# Patient Record
Sex: Female | Born: 1945 | Race: White | Hispanic: No | Marital: Married | Smoking: Current every day smoker
Health system: Southern US, Community
[De-identification: ages and names within clinical notes are randomized; demographics above are authoritative.]

## PROBLEM LIST (undated history)

## (undated) DIAGNOSIS — E7800 Pure hypercholesterolemia, unspecified: Secondary | ICD-10-CM

## (undated) DIAGNOSIS — Z923 Personal history of irradiation: Secondary | ICD-10-CM

## (undated) DIAGNOSIS — D649 Anemia, unspecified: Secondary | ICD-10-CM

## (undated) DIAGNOSIS — K5790 Diverticulosis of intestine, part unspecified, without perforation or abscess without bleeding: Secondary | ICD-10-CM

## (undated) DIAGNOSIS — N390 Urinary tract infection, site not specified: Secondary | ICD-10-CM

## (undated) DIAGNOSIS — Z8719 Personal history of other diseases of the digestive system: Secondary | ICD-10-CM

## (undated) DIAGNOSIS — R011 Cardiac murmur, unspecified: Secondary | ICD-10-CM

## (undated) DIAGNOSIS — C4490 Unspecified malignant neoplasm of skin, unspecified: Secondary | ICD-10-CM

## (undated) DIAGNOSIS — K219 Gastro-esophageal reflux disease without esophagitis: Secondary | ICD-10-CM

## (undated) DIAGNOSIS — I10 Essential (primary) hypertension: Secondary | ICD-10-CM

## (undated) DIAGNOSIS — Z85828 Personal history of other malignant neoplasm of skin: Secondary | ICD-10-CM

## (undated) DIAGNOSIS — Z9289 Personal history of other medical treatment: Secondary | ICD-10-CM

## (undated) DIAGNOSIS — E119 Type 2 diabetes mellitus without complications: Secondary | ICD-10-CM

## (undated) HISTORY — PX: OTHER SURGICAL HISTORY: SHX169

## (undated) HISTORY — PX: CHOLECYSTECTOMY: SHX55

## (undated) HISTORY — PX: BREAST LUMPECTOMY: SHX2

---

## 2001-01-06 ENCOUNTER — Other Ambulatory Visit: Admission: RE | Admit: 2001-01-06 | Discharge: 2001-01-06 | Payer: Self-pay | Admitting: Obstetrics and Gynecology

## 2001-04-28 ENCOUNTER — Ambulatory Visit (HOSPITAL_COMMUNITY): Admission: RE | Admit: 2001-04-28 | Discharge: 2001-04-28 | Payer: Self-pay | Admitting: Obstetrics and Gynecology

## 2001-04-28 ENCOUNTER — Encounter: Payer: Self-pay | Admitting: Obstetrics and Gynecology

## 2002-06-23 ENCOUNTER — Encounter: Payer: Self-pay | Admitting: Obstetrics and Gynecology

## 2002-06-23 ENCOUNTER — Ambulatory Visit (HOSPITAL_COMMUNITY): Admission: RE | Admit: 2002-06-23 | Discharge: 2002-06-23 | Payer: Self-pay | Admitting: Obstetrics and Gynecology

## 2002-07-21 ENCOUNTER — Other Ambulatory Visit: Admission: RE | Admit: 2002-07-21 | Discharge: 2002-07-21 | Payer: Self-pay | Admitting: Obstetrics and Gynecology

## 2002-12-13 ENCOUNTER — Encounter: Payer: Self-pay | Admitting: Internal Medicine

## 2002-12-13 ENCOUNTER — Ambulatory Visit (HOSPITAL_COMMUNITY): Admission: RE | Admit: 2002-12-13 | Discharge: 2002-12-13 | Payer: Self-pay | Admitting: Internal Medicine

## 2003-01-04 ENCOUNTER — Encounter (HOSPITAL_COMMUNITY): Admission: RE | Admit: 2003-01-04 | Discharge: 2003-02-03 | Payer: Self-pay | Admitting: Family Medicine

## 2003-07-20 ENCOUNTER — Ambulatory Visit (HOSPITAL_COMMUNITY): Admission: RE | Admit: 2003-07-20 | Discharge: 2003-07-20 | Payer: Self-pay | Admitting: Obstetrics and Gynecology

## 2004-08-07 ENCOUNTER — Ambulatory Visit (HOSPITAL_COMMUNITY): Admission: RE | Admit: 2004-08-07 | Discharge: 2004-08-07 | Payer: Self-pay | Admitting: Obstetrics and Gynecology

## 2005-09-10 ENCOUNTER — Ambulatory Visit (HOSPITAL_COMMUNITY): Admission: RE | Admit: 2005-09-10 | Discharge: 2005-09-10 | Payer: Self-pay | Admitting: Obstetrics and Gynecology

## 2006-12-16 ENCOUNTER — Ambulatory Visit (HOSPITAL_COMMUNITY): Admission: RE | Admit: 2006-12-16 | Discharge: 2006-12-16 | Payer: Self-pay | Admitting: Obstetrics and Gynecology

## 2007-09-03 HISTORY — PX: COLONOSCOPY: SHX174

## 2008-01-19 ENCOUNTER — Ambulatory Visit (HOSPITAL_COMMUNITY): Admission: RE | Admit: 2008-01-19 | Discharge: 2008-01-19 | Payer: Self-pay | Admitting: Obstetrics and Gynecology

## 2008-02-09 ENCOUNTER — Other Ambulatory Visit: Admission: RE | Admit: 2008-02-09 | Discharge: 2008-02-09 | Payer: Self-pay | Admitting: Internal Medicine

## 2008-03-01 ENCOUNTER — Ambulatory Visit (HOSPITAL_COMMUNITY): Admission: RE | Admit: 2008-03-01 | Discharge: 2008-03-01 | Payer: Self-pay | Admitting: General Surgery

## 2008-03-08 ENCOUNTER — Encounter: Admission: RE | Admit: 2008-03-08 | Discharge: 2008-03-08 | Payer: Self-pay | Admitting: Obstetrics and Gynecology

## 2009-02-22 ENCOUNTER — Ambulatory Visit (HOSPITAL_COMMUNITY): Admission: RE | Admit: 2009-02-22 | Discharge: 2009-02-22 | Payer: Self-pay | Admitting: Obstetrics and Gynecology

## 2009-02-28 ENCOUNTER — Other Ambulatory Visit: Admission: RE | Admit: 2009-02-28 | Discharge: 2009-02-28 | Payer: Self-pay | Admitting: Obstetrics and Gynecology

## 2010-03-13 ENCOUNTER — Ambulatory Visit (HOSPITAL_COMMUNITY): Admission: RE | Admit: 2010-03-13 | Discharge: 2010-03-13 | Payer: Self-pay | Admitting: Obstetrics and Gynecology

## 2010-04-03 ENCOUNTER — Other Ambulatory Visit: Admission: RE | Admit: 2010-04-03 | Discharge: 2010-04-03 | Payer: Self-pay | Admitting: Obstetrics and Gynecology

## 2010-09-23 ENCOUNTER — Encounter: Payer: Self-pay | Admitting: Obstetrics and Gynecology

## 2010-09-24 ENCOUNTER — Encounter: Payer: Self-pay | Admitting: Obstetrics and Gynecology

## 2011-01-15 NOTE — H&P (Signed)
NAME:  Courtney Dorsey, Courtney Dorsey               ACCOUNT NO.:  [id number]   MEDICAL RECORD NO.:  [id number]          PATIENT TYPE:  AMB   LOCATION:  DAY                           FACILITY:  [facility]   PHYSICIAN:  Dalia Heading, M.D.  DATE OF BIRTH:  November 11, 1945   DATE OF ADMISSION:  DATE OF DISCHARGE:  LH                              HISTORY & PHYSICAL   CHIEF COMPLAINT:  Family history of colon carcinoma.   HISTORY OF PRESENT ILLNESS:  The patient is a 65 year old white female  who is referred for endoscopic evaluation.  She needs a colonoscopy due  to family history of colon carcinoma in both her mother and brother.  No  abdominal pain, weight loss, nausea, vomiting, diarrhea, constipation,  melena, or hematochezia have been noted.  Never has had a colonoscopy.   PAST MEDICAL HISTORY:  Hypertension.   PAST SURGICAL HISTORY:  Cholecystectomy.   CURRENT MEDICATIONS:  1. Lipitor.  2. Prevacid.  3. Enalapril.  4. A fluid pill.   ALLERGIES:  No know drug allergies.   REVIEW OF SYSTEMS:  The patient smokes a pack of cigarettes a day.  She  denies alcohol use.  She denies any other cardiopulmonary difficulties  or bleeding disorders.   PHYSICAL EXAMINATION:  GENERAL:  The patient is a well-developed, well-  nourished white female in no acute distress.  LUNGS:  Clear to auscultation with equal breath sounds bilaterally.  HEART:  Regular rate and rhythm without S3, S4, or murmurs.  ABDOMEN:  Soft, nontender, and nondistended.  No hepatosplenomegaly or  masses are noted.  RECTAL:  Deferred to the procedure.   IMPRESSION:  Family history of colon carcinoma.   PLAN:  The patient is scheduled for a colonoscopy on March 01, 2008.  The  risks and benefits of the procedure including bleeding and perforation  were fully explained to the patient who gave informed consent.      Dalia Heading, M.D.  Electronically Signed     MAJ/MEDQ  D:  02/25/2008  T:  02/26/2008  Job:  [id number]   cc:   Corrie Mckusick, M.D.  Fax: [phone]

## 2011-04-10 ENCOUNTER — Other Ambulatory Visit: Payer: Self-pay | Admitting: Obstetrics and Gynecology

## 2011-04-10 DIAGNOSIS — Z139 Encounter for screening, unspecified: Secondary | ICD-10-CM

## 2011-04-18 ENCOUNTER — Ambulatory Visit (HOSPITAL_COMMUNITY)
Admission: RE | Admit: 2011-04-18 | Discharge: 2011-04-18 | Disposition: A | Discharge status: Home / Self Care | Payer: 59 | Source: Ambulatory Visit | Attending: Obstetrics and Gynecology | Admitting: Obstetrics and Gynecology

## 2011-04-18 DIAGNOSIS — Z1231 Encounter for screening mammogram for malignant neoplasm of breast: Secondary | ICD-10-CM | POA: Insufficient documentation

## 2011-04-18 DIAGNOSIS — Z139 Encounter for screening, unspecified: Secondary | ICD-10-CM

## 2012-04-10 ENCOUNTER — Other Ambulatory Visit: Payer: Self-pay | Admitting: Obstetrics and Gynecology

## 2012-04-10 DIAGNOSIS — Z139 Encounter for screening, unspecified: Secondary | ICD-10-CM

## 2012-04-20 ENCOUNTER — Ambulatory Visit (HOSPITAL_COMMUNITY)
Admission: RE | Admit: 2012-04-20 | Discharge: 2012-04-20 | Disposition: A | Discharge status: Home / Self Care | Payer: 59 | Source: Ambulatory Visit | Attending: Obstetrics and Gynecology | Admitting: Obstetrics and Gynecology

## 2012-04-20 DIAGNOSIS — Z139 Encounter for screening, unspecified: Secondary | ICD-10-CM

## 2012-04-20 DIAGNOSIS — Z1231 Encounter for screening mammogram for malignant neoplasm of breast: Secondary | ICD-10-CM | POA: Insufficient documentation

## 2012-05-07 ENCOUNTER — Other Ambulatory Visit: Payer: Self-pay | Admitting: Obstetrics & Gynecology

## 2012-05-07 ENCOUNTER — Other Ambulatory Visit (HOSPITAL_COMMUNITY)
Admission: RE | Admit: 2012-05-07 | Discharge: 2012-05-07 | Disposition: A | Discharge status: Home / Self Care | Payer: 59 | Source: Ambulatory Visit | Attending: Obstetrics & Gynecology | Admitting: Obstetrics & Gynecology

## 2012-05-07 DIAGNOSIS — Z01419 Encounter for gynecological examination (general) (routine) without abnormal findings: Secondary | ICD-10-CM | POA: Insufficient documentation

## 2012-12-29 ENCOUNTER — Ambulatory Visit (HOSPITAL_COMMUNITY)
Admission: RE | Admit: 2012-12-29 | Discharge: 2012-12-29 | Disposition: A | Discharge status: Home / Self Care | Payer: Medicare Other | Source: Ambulatory Visit | Attending: Family Medicine | Admitting: Family Medicine

## 2012-12-29 ENCOUNTER — Other Ambulatory Visit (HOSPITAL_COMMUNITY): Payer: Self-pay | Admitting: Family Medicine

## 2012-12-29 DIAGNOSIS — Z6837 Body mass index (BMI) 37.0-37.9, adult: Secondary | ICD-10-CM | POA: Diagnosis not present

## 2012-12-29 DIAGNOSIS — I700 Atherosclerosis of aorta: Secondary | ICD-10-CM | POA: Insufficient documentation

## 2012-12-29 DIAGNOSIS — K449 Diaphragmatic hernia without obstruction or gangrene: Secondary | ICD-10-CM | POA: Insufficient documentation

## 2012-12-29 DIAGNOSIS — I10 Essential (primary) hypertension: Secondary | ICD-10-CM | POA: Diagnosis not present

## 2012-12-29 DIAGNOSIS — E785 Hyperlipidemia, unspecified: Secondary | ICD-10-CM | POA: Diagnosis not present

## 2012-12-29 DIAGNOSIS — R079 Chest pain, unspecified: Secondary | ICD-10-CM

## 2012-12-29 DIAGNOSIS — R0602 Shortness of breath: Secondary | ICD-10-CM | POA: Insufficient documentation

## 2012-12-29 DIAGNOSIS — Z719 Counseling, unspecified: Secondary | ICD-10-CM | POA: Diagnosis not present

## 2012-12-29 DIAGNOSIS — R011 Cardiac murmur, unspecified: Secondary | ICD-10-CM | POA: Diagnosis not present

## 2012-12-29 DIAGNOSIS — E119 Type 2 diabetes mellitus without complications: Secondary | ICD-10-CM | POA: Diagnosis not present

## 2012-12-29 DIAGNOSIS — I517 Cardiomegaly: Secondary | ICD-10-CM | POA: Insufficient documentation

## 2012-12-29 DIAGNOSIS — F17200 Nicotine dependence, unspecified, uncomplicated: Secondary | ICD-10-CM | POA: Insufficient documentation

## 2012-12-31 ENCOUNTER — Inpatient Hospital Stay (HOSPITAL_COMMUNITY)
Admission: EM | Admit: 2012-12-31 | Discharge: 2013-01-01 | DRG: 379 | Disposition: A | Discharge status: Home / Self Care | Payer: Medicare Other | Attending: Family Medicine | Admitting: Family Medicine

## 2012-12-31 ENCOUNTER — Encounter (HOSPITAL_COMMUNITY): Payer: Self-pay | Admitting: *Deleted

## 2012-12-31 DIAGNOSIS — D500 Iron deficiency anemia secondary to blood loss (chronic): Secondary | ICD-10-CM | POA: Diagnosis present

## 2012-12-31 DIAGNOSIS — I10 Essential (primary) hypertension: Secondary | ICD-10-CM | POA: Diagnosis present

## 2012-12-31 DIAGNOSIS — E876 Hypokalemia: Secondary | ICD-10-CM | POA: Diagnosis present

## 2012-12-31 DIAGNOSIS — Z800 Family history of malignant neoplasm of digestive organs: Secondary | ICD-10-CM

## 2012-12-31 DIAGNOSIS — E7800 Pure hypercholesterolemia, unspecified: Secondary | ICD-10-CM | POA: Diagnosis present

## 2012-12-31 DIAGNOSIS — R011 Cardiac murmur, unspecified: Secondary | ICD-10-CM | POA: Diagnosis present

## 2012-12-31 DIAGNOSIS — K449 Diaphragmatic hernia without obstruction or gangrene: Secondary | ICD-10-CM | POA: Diagnosis present

## 2012-12-31 DIAGNOSIS — R0989 Other specified symptoms and signs involving the circulatory and respiratory systems: Secondary | ICD-10-CM

## 2012-12-31 DIAGNOSIS — F17200 Nicotine dependence, unspecified, uncomplicated: Secondary | ICD-10-CM | POA: Diagnosis present

## 2012-12-31 DIAGNOSIS — K648 Other hemorrhoids: Secondary | ICD-10-CM | POA: Diagnosis not present

## 2012-12-31 DIAGNOSIS — K5730 Diverticulosis of large intestine without perforation or abscess without bleeding: Secondary | ICD-10-CM | POA: Diagnosis present

## 2012-12-31 DIAGNOSIS — D509 Iron deficiency anemia, unspecified: Secondary | ICD-10-CM

## 2012-12-31 DIAGNOSIS — K2960 Other gastritis without bleeding: Secondary | ICD-10-CM | POA: Diagnosis present

## 2012-12-31 DIAGNOSIS — K922 Gastrointestinal hemorrhage, unspecified: Secondary | ICD-10-CM | POA: Diagnosis not present

## 2012-12-31 DIAGNOSIS — Z79899 Other long term (current) drug therapy: Secondary | ICD-10-CM

## 2012-12-31 DIAGNOSIS — D649 Anemia, unspecified: Secondary | ICD-10-CM

## 2012-12-31 DIAGNOSIS — E119 Type 2 diabetes mellitus without complications: Secondary | ICD-10-CM

## 2012-12-31 DIAGNOSIS — K2980 Duodenitis without bleeding: Secondary | ICD-10-CM | POA: Diagnosis not present

## 2012-12-31 DIAGNOSIS — K219 Gastro-esophageal reflux disease without esophagitis: Secondary | ICD-10-CM | POA: Diagnosis present

## 2012-12-31 DIAGNOSIS — R0609 Other forms of dyspnea: Secondary | ICD-10-CM | POA: Diagnosis not present

## 2012-12-31 DIAGNOSIS — R5381 Other malaise: Secondary | ICD-10-CM | POA: Diagnosis not present

## 2012-12-31 HISTORY — DX: Essential (primary) hypertension: I10

## 2012-12-31 HISTORY — DX: Type 2 diabetes mellitus without complications: E11.9

## 2012-12-31 HISTORY — DX: Cardiac murmur, unspecified: R01.1

## 2012-12-31 HISTORY — DX: Anemia, unspecified: D64.9

## 2012-12-31 HISTORY — DX: Diverticulosis of intestine, part unspecified, without perforation or abscess without bleeding: K57.90

## 2012-12-31 HISTORY — DX: Pure hypercholesterolemia, unspecified: E78.00

## 2012-12-31 LAB — HEPATIC FUNCTION PANEL
ALT: 14 U/L (ref 0–35)
Bilirubin, Direct: <0.1 mg/dL (ref 0.0–0.3)
Total Bilirubin: 0.3 mg/dL (ref 0.3–1.2)
Total Protein: 7.4 g/dL (ref 6.0–8.3)

## 2012-12-31 LAB — BASIC METABOLIC PANEL
CO2: 26 mEq/L (ref 19–32)
Chloride: 97 mEq/L (ref 96–112)
GFR calc Af Amer: 82 mL/min — ABNORMAL LOW (ref >90)
Glucose, Bld: 191 mg/dL — ABNORMAL HIGH (ref 70–99)

## 2012-12-31 LAB — CBC WITH DIFFERENTIAL/PLATELET
HCT: 20.3 % — ABNORMAL LOW (ref 36.0–46.0)
Hemoglobin: 5.3 g/dL — CL (ref 12.0–15.0)
Lymphocytes Relative: 18 % (ref 12–46)
Lymphs Abs: 1.9 10*3/uL (ref 0.7–4.0)
Monocytes Absolute: 0.9 10*3/uL (ref 0.1–1.0)
Neutro Abs: 7.1 10*3/uL (ref 1.7–7.7)
Neutrophils Relative %: 69 % (ref 43–77)
RDW: 19.2 % — ABNORMAL HIGH (ref 11.5–15.5)

## 2012-12-31 LAB — RETICULOCYTES: RBC.: 3.34 MIL/uL — ABNORMAL LOW (ref 3.87–5.11)

## 2012-12-31 MED ORDER — PEG 3350-KCL-NA BICARB-NACL 420 G PO SOLR
4000.0000 mL | Freq: Once | ORAL | Status: AC
  Administered 2012-12-31: 4000 mL via ORAL
  Filled 2012-12-31: qty 4000

## 2012-12-31 MED ORDER — ACETAMINOPHEN 325 MG PO TABS: 650.0000 mg | ORAL_TABLET | Freq: Four times a day (QID) | ORAL | Status: DC | PRN

## 2012-12-31 MED ORDER — ACETAMINOPHEN 650 MG RE SUPP: 650.0000 mg | Freq: Four times a day (QID) | RECTAL | Status: DC | PRN

## 2012-12-31 MED ORDER — ENALAPRIL MALEATE 5 MG PO TABS
10.0000 mg | ORAL_TABLET | Freq: Two times a day (BID) | ORAL | Status: DC
  Administered 2013-01-01 (×2): 10 mg via ORAL
  Filled 2012-12-31 (×2): qty 2

## 2012-12-31 MED ORDER — ATORVASTATIN CALCIUM 40 MG PO TABS
40.0000 mg | ORAL_TABLET | Freq: Every day | ORAL | Status: DC
  Administered 2012-12-31: 40 mg via ORAL
  Filled 2012-12-31: qty 1

## 2012-12-31 MED ORDER — SODIUM CHLORIDE 0.9 % IJ SOLN
3.0000 mL | Freq: Two times a day (BID) | INTRAMUSCULAR | Status: DC
  Administered 2013-01-01 (×2): 3 mL via INTRAVENOUS

## 2012-12-31 MED ORDER — PANTOPRAZOLE SODIUM 40 MG IV SOLR
40.0000 mg | Freq: Two times a day (BID) | INTRAVENOUS | Status: DC
  Administered 2013-01-01 (×2): 40 mg via INTRAVENOUS
  Filled 2012-12-31 (×2): qty 40

## 2012-12-31 MED ORDER — PEG 3350-KCL-NA BICARB-NACL 420 G PO SOLR
ORAL | Status: AC
  Filled 2012-12-31: qty 4000

## 2012-12-31 MED ORDER — SODIUM CHLORIDE 0.9 % IJ SOLN
3.0000 mL | INTRAMUSCULAR | Status: DC | PRN
  Administered 2012-12-31: 3 mL via INTRAVENOUS

## 2012-12-31 MED ORDER — POTASSIUM CHLORIDE CRYS ER 20 MEQ PO TBCR
40.0000 meq | EXTENDED_RELEASE_TABLET | Freq: Once | ORAL | Status: AC
  Administered 2012-12-31: 40 meq via ORAL
  Filled 2012-12-31: qty 2

## 2012-12-31 MED ORDER — ONDANSETRON HCL 4 MG/2ML IJ SOLN: 4.0000 mg | Freq: Four times a day (QID) | INTRAMUSCULAR | Status: DC | PRN

## 2012-12-31 MED ORDER — SODIUM CHLORIDE 0.9 % IJ SOLN
3.0000 mL | Freq: Two times a day (BID) | INTRAMUSCULAR | Status: DC
  Administered 2013-01-01: 3 mL via INTRAVENOUS

## 2012-12-31 MED ORDER — ONDANSETRON HCL 4 MG PO TABS: 4.0000 mg | ORAL_TABLET | Freq: Four times a day (QID) | ORAL | Status: DC | PRN

## 2012-12-31 MED ORDER — INSULIN ASPART 100 UNIT/ML ~~LOC~~ SOLN
0.0000 [IU] | Freq: Three times a day (TID) | SUBCUTANEOUS | Status: DC
  Administered 2013-01-01: 1 [IU] via SUBCUTANEOUS

## 2012-12-31 MED ORDER — SODIUM CHLORIDE 0.9 % IV SOLN: 250.0000 mL | INTRAVENOUS | Status: DC | PRN

## 2012-12-31 NOTE — ED Provider Notes (Signed)
History     CSN: [id number]  Arrival date & time 12/31/12  1237   First MD Initiated Contact with Patient 12/31/12 1258      Chief Complaint  Patient presents with  . Anemia    (Consider location/radiation/quality/duration/timing/severity/associated sxs/prior treatment) HPI Patient states she started getting dyspnea on exertion about one to 2 months ago. She denies any chest pain, dizziness, or lightheadedness. She states when she feels short of breath she feels mild weakness. She has noticed her exercise tolerance is worse and she feels bad with doing activities such as vacuuming the house. She denies any rectal bleeding but states maybe a month ago she did have one episode of a black stool. She denies any abdominal pain, nausea, vomiting, menstrual bleeding. She was seen by her PCP 2 days ago and was called today that she was anemic and told to come to the ED. She states she's never been anemic before and she's never had to have a transfusion before. She states she did have a colonoscopy when she was 34 and states as far she recalls everything was normal, she does not recall being told she had diverticulosis or polyps.  PCP Dr. Phillips Odor   Past Medical History  Diagnosis Date  . Diabetes mellitus without complication   . Anemia   . Heart murmur   . Hypertension   . Hypercholesteremia     Past Surgical History  Procedure Laterality Date  . Cholecystectomy      History reviewed. No pertinent family history.  History  Substance Use Topics  . Smoking status: Current Every Day Smoker  . Smokeless tobacco: Not on file  . Alcohol Use: No  retired On week 2 of nicotene patches  OB History   Grav Para Term Preterm Abortions TAB SAB Ect Mult Living                  Review of Systems  All other systems reviewed and are negative.    Allergies  Review of patient's allergies indicates not on file.  Home Medications   Current Outpatient Rx  Name  Route  Sig  Dispense   Refill  . atorvastatin (LIPITOR) 40 MG tablet   Oral   Take 40 mg by mouth daily.         . ENALAPRIL MALEATE PO   Oral   Take 1 tablet by mouth 2 (two) times daily.         . hydrochlorothiazide (MICROZIDE) 12.5 MG capsule   Oral   Take 12.5 mg by mouth daily.         ASA 81 mg daily Migraine OTC 2-3 times a week for headaches  BP 149/62  Pulse 123  Temp(Src) 98.1 F (36.7 C) (Oral)  Resp 18  Ht 5\' 2"  (1.575 m)  Wt 190 lb (86.183 kg)  BMI 34.74 kg/m2  SpO2 98%  Vital signs normal except tachycardia   Physical Exam  Nursing note and vitals reviewed. Constitutional: She is oriented to person, place, and time. She appears well-developed and well-nourished.  Non-toxic appearance. She does not appear ill. No distress.  HENT:  Head: Normocephalic and atraumatic.  Right Ear: External ear normal.  Left Ear: External ear normal.  Nose: Nose normal. No mucosal edema or rhinorrhea.  Mouth/Throat: Oropharynx is clear and moist and mucous membranes are normal. No dental abscesses or edematous.  Eyes: Conjunctivae and EOM are normal. Pupils are equal, round, and reactive to light.  Neck: Normal range of motion  and full passive range of motion without pain. Neck supple.  Cardiovascular: Regular rhythm and normal heart sounds.  Tachycardia present.  Exam reveals no gallop and no friction rub.   No murmur heard. Pulmonary/Chest: Effort normal and breath sounds normal. No respiratory distress. She has no wheezes. She has no rhonchi. She has no rales. She exhibits no tenderness and no crepitus.  Abdominal: Soft. Normal appearance and bowel sounds are normal. She exhibits no distension. There is no tenderness. There is no rebound and no guarding.  Genitourinary:  No external hemorrhoids, no stool in vault, specks of brown stool on glove  Musculoskeletal: Normal range of motion. She exhibits no edema and no tenderness.  Moves all extremities well.   Neurological: She is alert and  oriented to person, place, and time. She has normal strength. No cranial nerve deficit.  Skin: Skin is warm, dry and intact. No rash noted. No erythema. There is pallor.  Psychiatric: She has a normal mood and affect. Her speech is normal and behavior is normal. Her mood appears not anxious.    ED Course  Procedures (including critical care time)     Reviewed results of her CBC, and prepared patient to get transfusion.  14:04 Dr Irene Limbo, admit to step-down, team 2    Results for orders placed during the hospital encounter of 12/31/12  CBC WITH DIFFERENTIAL      Result Value Range   WBC 10.3  4.0 - 10.5 K/uL   RBC 3.37 (*) 3.87 - 5.11 MIL/uL   Hemoglobin 5.3 (*) 12.0 - 15.0 g/dL   HCT 95.6 (*) 21.3 - 08.6 %   MCV 60.2 (*) 78.0 - 100.0 fL   MCH 15.7 (*) 26.0 - 34.0 pg   MCHC 26.1 (*) 30.0 - 36.0 g/dL   RDW 57.8 (*) 46.9 - 62.9 %   Platelets 387  150 - 400 K/uL   Neutrophils Relative 69  43 - 77 %   Lymphocytes Relative 18  12 - 46 %   Monocytes Relative 9  3 - 12 %   Eosinophils Relative 3  0 - 5 %   Basophils Relative 1  0 - 1 %   Neutro Abs 7.1  1.7 - 7.7 K/uL   Lymphs Abs 1.9  0.7 - 4.0 K/uL   Monocytes Absolute 0.9  0.1 - 1.0 K/uL   Eosinophils Absolute 0.3  0.0 - 0.7 K/uL   Basophils Absolute 0.1  0.0 - 0.1 K/uL   RBC Morphology POLYCHROMASIA PRESENT    BASIC METABOLIC PANEL      Result Value Range   Sodium 135  135 - 145 mEq/L   Potassium 3.4 (*) 3.5 - 5.1 mEq/L   Chloride 97  96 - 112 mEq/L   CO2 26  19 - 32 mEq/L   Glucose, Bld 191 (*) 70 - 99 mg/dL   BUN 17  6 - 23 mg/dL   Creatinine, Ser 5.28  0.50 - 1.10 mg/dL   Calcium 9.1  8.4 - 41.3 mg/dL   GFR calc non Af Amer 70 (*) >90 mL/min   GFR calc Af Amer 82 (*) >90 mL/min  OCCULT BLOOD, POC DEVICE      Result Value Range   Fecal Occult Bld NEGATIVE  NEGATIVE    Laboratory interpretation all normal except anemia, indices c/w iron deficiency  Dg Chest 2 View  12/29/2012  *RADIOLOGY REPORT*  Clinical  Data: Shortness of breath for 1 month.  Smoker.  CHEST - 2 VIEW  Comparison: None.  Findings: Lateral view degraded by patient arm position.  Right upper quadrant surgical clips.  Midline trachea.  Borderline cardiomegaly, with atherosclerosis in the transverse aorta. No pleural effusion or pneumothorax.  A moderate-to-large hiatal hernia. Clear lungs.  IMPRESSION:  1. No acute cardiopulmonary disease. 2.  Moderate-to-large hiatal hernia. 3.  Borderline cardiomegaly, with atherosclerosis in the transverse aorta.   Original Report Authenticated By: Jeronimo Greaves, M.D.     Date: 12/31/2012  Rate: 118  Rhythm: sinus tachycardia  QRS Axis: normal  Intervals: normal  ST/T Wave abnormalities: normal  Conduction Disutrbances:none  Narrative Interpretation:   Old EKG Reviewed: none available    1. Anemia   2. DOE (dyspnea on exertion)     Plan admission   CRITICAL CARE Performed by: Joram Venson L   Total critical care time: 31 min   Critical care time was exclusive of separately billable procedures and treating other patients.  Critical care was necessary to treat or prevent imminent or life-threatening deterioration.  Critical care was time spent personally by me on the following activities: development of treatment plan with patient and/or surrogate as well as nursing, discussions with consultants, evaluation of patient's response to treatment, examination of patient, obtaining history from patient or surrogate, ordering and performing treatments and interventions, ordering and review of laboratory studies, ordering and review of radiographic studies, pulse oximetry and re-evaluation of patient's condition.   MDM          Ward Givens, MD 12/31/12 [phone]

## 2012-12-31 NOTE — ED Notes (Signed)
Blood bank tube hemolyzed per lab. On way to restick.

## 2012-12-31 NOTE — ED Notes (Signed)
CRITICAL VALUE ALERT  Critical value received:  hgb 5.3  hct 20.3  Date of notification:  12/31/12  Time of notification:  1325  Critical value read back:yes  Nurse who received alert:  c Spiro Ausborn rn  MD notified (1st page):  Dr Devoria Albe  Time of first page:    MD notified (2nd page):  Time of second page:  Responding MD:  Dr Devoria Albe  Time MD responded:  [phone]

## 2012-12-31 NOTE — H&P (Signed)
History and Physical  Courtney Dorsey:[id number] DOB: [birth year] DOA: 12/31/2012  Referring physician: Devoria Albe, MD PCP: Colette Ribas, MD   Chief Complaint: short of breath  HPI:  66 year old woman presented to the emergency department for anemia. Hemoglobin was found to be 5.3 and patient was admitted for further evaluation.  History obtained from patient at bedside. For the last 2 months she has had progressive dyspnea on exertion. She has done fairly well with this but now becomes short of breath when vacuuming, requiring rest. She has had no chest pain or other symptoms. She has had no bleeding that she is aware of. She may apply had one dark stool sometime ago. She had colonoscopy approximately 5 years ago which she reports to have been unremarkable. She does take ibuprofen. No syncope. She saw her primary care physician 2 days ago for her increasing shortness of breath. Chest x-ray at that time was negative. Blood work was obtained with result of marked anemia. She was contacted today by her primary care physician and referred to the emergency department.  In the emergency department she was noted to be afebrile, mildly tachycardic, normotensive. No hypoxia. She was able to ambulate to the bathroom without difficulty.  Review of Systems:  Negative for fever, visual changes, sore throat, rash, new muscle aches, chest pain, dysuria, bleeding, n/v/abdominal pain.  Past Medical History  Diagnosis Date  . Diabetes mellitus without complication   . Anemia   . Heart murmur   . Hypertension   . Hypercholesteremia   . Diverticulosis     Past Surgical History  Procedure Laterality Date  . Cholecystectomy      Social History:  reports that she has been smoking.  She does not have any smokeless tobacco history on file. She reports that she does not drink alcohol or use illicit drugs.  No Known Allergies  Family History  Problem Relation Age of Onset  . Colon cancer Mother       Prior to Admission medications   Medication Sig Start Date End Date Taking? Authorizing Provider  atorvastatin (LIPITOR) 40 MG tablet Take 40 mg by mouth daily.   Yes Historical Provider, MD  ENALAPRIL MALEATE PO Take 10 mg by mouth 2 (two) times daily.    Yes Historical Provider, MD  hydrochlorothiazide (HYDRODIURIL) 25 MG tablet Take 25 mg by mouth daily before breakfast.   Yes Historical Provider, MD  ibuprofen (ADVIL,MOTRIN) 800 MG tablet Take 800 mg by mouth as needed for pain.   Yes Historical Provider, MD  metFORMIN (GLUCOPHAGE) 500 MG tablet Take 250 mg by mouth 2 (two) times daily with a meal.   Yes Historical Provider, MD  omeprazole (PRILOSEC) 40 MG capsule Take 40 mg by mouth daily.   Yes Historical Provider, MD   Physical Exam: Filed Vitals:   12/31/12 1245 12/31/12 1441  BP: 149/62 139/63  Pulse: 123 103  Temp: 98.1 F (36.7 C)   TempSrc: Oral   Resp: 18 21  Height: 5\' 2"  (1.575 m)   Weight: 86.183 kg (190 lb)   SpO2: 98% 96%    General:  Examined in the emergency department. Appears calm and comfortable Eyes: PERRL, normal lids, irises  ENT: grossly normal hearing, lips & tongue Neck: no LAD, masses or thyromegaly Cardiovascular: RRR, no m/r/g. No LE edema. Respiratory: CTA bilaterally, no w/r/r. Normal respiratory effort. Abdomen: soft, ntnd Skin: no rash or induration seen  Musculoskeletal: grossly normal tone BUE/BLE Psychiatric: grossly normal mood and affect,  speech fluent and appropriate Neurologic: grossly non-focal.  Wt Readings from Last 3 Encounters:  12/31/12 86.183 kg (190 lb)    Labs on Admission:  Basic Metabolic Panel:  Recent Labs Lab 12/31/12 1255  NA 135  K 3.4*  CL 97  CO2 26  GLUCOSE 191*  BUN 17  CREATININE 0.85  CALCIUM 9.1    Liver Function Tests:  Recent Labs Lab 12/31/12 1303  AST 16  ALT 14  ALKPHOS 94  BILITOT 0.3  PROT 7.4  ALBUMIN 3.9    CBC:  Recent Labs Lab 12/31/12 1255  WBC 10.3   NEUTROABS 7.1  HGB 5.3*  HCT 20.3*  MCV 60.2*  PLT 387    EKG: Independently reviewed. Sinus tachycardia. No acute changes.   Principal Problem:   Microcytic anemia Active Problems:   Diabetes mellitus type 2, controlled   GI bleed   Assessment/Plan 1. Marked microcytic anemia, suspect subacute GI bleed: History, stable hemodynamics and microcytosis suggest subacute process. Guaiac negative. Appears stable for admission to the medical floor. GI consultation. PPI. Transfuse 2 units packed red blood cells. Check anemia panel. May be secondary to ibuprofen. Associated with that seen on exertion. 2. Hypokalemia: Replete. 3. Diabetes mellitus type 2: Sliding scale insulin. Resume metformin on discharge. 4. Hypertension: stable.  5. Cigarette smoker: Nicotine patch. She is trying to quit.  Code Status: Full code Family Communication: Discussed with husband at bedside Disposition Plan/Anticipated LOS: Admit. 2-3 days.  Time spent: 60 minutes  Brendia Sacks, MD  [facility] Pager [phone] 12/31/2012, 2:55 PM

## 2012-12-31 NOTE — Consult Note (Signed)
Referring Provider: Standley Brooking, MD Primary Care Physician:  Colette Ribas, MD Primary Gastroenterologist:  Roetta Sessions, MD  Reason for Consultation:  Profound microcytic anemia  HPI: Courtney Dorsey is a 67 y.o. female admitted with profound anemia. Two month h/o progressive DOE. Had labs with PCP and called today with results. Advised to go to ED for profound anemia. In ED, her H/H were 5.3/20.3, MCV 60.2. Heme negative in the ED. Ibuprofen off/on for joint pain. No BC or Goody's powders. Excedrine migraine medication occasionally. No brbpr, melena. No constipation, diarrhea, abdominal pain. Heartburn not controlled on omeprazole. Sometimes also takes zantac in evening. No dysphagia. No weight loss. Appetite good. GERD more than five years. No prior EGD. Last colonoscopy 2009 by Dr. Franky Macho. Mother had colon cancer at age 21, brother at age 40.   Prior to Admission medications   Medication Sig Start Date End Date Taking? Authorizing Provider  atorvastatin (LIPITOR) 40 MG tablet Take 40 mg by mouth daily.   Yes Historical Provider, MD  ENALAPRIL MALEATE PO Take 10 mg by mouth 2 (two) times daily.    Yes Historical Provider, MD  hydrochlorothiazide (HYDRODIURIL) 25 MG tablet Take 25 mg by mouth daily before breakfast.   Yes Historical Provider, MD  ibuprofen (ADVIL,MOTRIN) 800 MG tablet Take 800 mg by mouth as needed for pain.   Yes Historical Provider, MD  metFORMIN (GLUCOPHAGE) 500 MG tablet Take 250 mg by mouth 2 (two) times daily with a meal.   Yes Historical Provider, MD  omeprazole (PRILOSEC) 40 MG capsule Take 40 mg by mouth daily.   Yes Historical Provider, MD    Current Facility-Administered Medications  Medication Dose Route Frequency Provider Last Rate Last Dose  . 0.9 %  sodium chloride infusion  250 mL Intravenous PRN Standley Brooking, MD      . acetaminophen (TYLENOL) tablet 650 mg  650 mg Oral Q6H PRN Standley Brooking, MD       Or  . acetaminophen (TYLENOL)  suppository 650 mg  650 mg Rectal Q6H PRN Standley Brooking, MD      . atorvastatin (LIPITOR) tablet 40 mg  40 mg Oral q1800 Standley Brooking, MD      . enalapril (VASOTEC) tablet 10 mg  10 mg Oral BID Standley Brooking, MD      . insulin aspart (novoLOG) injection 0-9 Units  0-9 Units Subcutaneous TID WC Standley Brooking, MD      . ondansetron [facility] tablet 4 mg  4 mg Oral Q6H PRN Standley Brooking, MD       Or  . ondansetron [facility]) injection 4 mg  4 mg Intravenous Q6H PRN Standley Brooking, MD      . pantoprazole (PROTONIX) injection 40 mg  40 mg Intravenous Q12H Standley Brooking, MD      . potassium chloride SA (K-DUR,KLOR-CON) CR tablet 40 mEq  40 mEq Oral Once Standley Brooking, MD      . sodium chloride 0.9 % injection 3 mL  3 mL Intravenous Q12H Standley Brooking, MD      . sodium chloride 0.9 % injection 3 mL  3 mL Intravenous Q12H Standley Brooking, MD      . sodium chloride 0.9 % injection 3 mL  3 mL Intravenous PRN Standley Brooking, MD        Allergies as of 12/31/2012  . (No Known Allergies)    Past Medical History  Diagnosis  Date  . Diabetes mellitus without complication   . Anemia   . Heart murmur   . Hypertension   . Hypercholesteremia   . Diverticulosis     Past Surgical History  Procedure Laterality Date  . Cholecystectomy    . Colonoscopy  2009    Dr. Franky Macho    Family History  Problem Relation Age of Onset  . Colon cancer Mother     diagnosed around age 53  . Colon cancer Brother     age 40  . Liver disease Neg Hx   . Lung cancer Neg Hx   . Breast cancer Neg Hx   . Ovarian cancer Neg Hx   . Celiac disease Neg Hx     History   Social History  . Marital Status: Married    Spouse Name: N/A    Number of Children: 1  . Years of Education: N/A   Occupational History  .     Social History Main Topics  . Smoking status: Current Every Day Smoker  . Smokeless tobacco: Never Used  . Alcohol Use: No  . Drug Use: No  . Sexually  Active: Yes    Birth Control/ Protection: Post-menopausal   Other Topics Concern  . Not on file   Social History Narrative  . No narrative on file     ROS:  General: Negative for anorexia, weight loss, fever, chill. c/o fatigue, weakness. Eyes: Negative for vision changes.  ENT: Negative for hoarseness, difficulty swallowing , nasal congestion. CV: Negative for chest pain, angina, palpitations,  peripheral edema. +DOE Respiratory: Negative for dyspnea at rest,  cough, sputum, wheezing. +DOE GI: See history of present illness. GU:  Negative for dysuria, hematuria, urinary incontinence, urinary frequency, nocturnal urination.  MS: Negative for joint pain, low back pain.  Derm: Negative for rash or itching.  Neuro: Negative for weakness, abnormal sensation, seizure, frequent headaches, memory loss, confusion.  Psych: Negative for anxiety, depression, suicidal ideation, hallucinations.  Endo: Negative for unusual weight change.  Heme: Negative for bruising or bleeding. Allergy: Negative for rash or hives.       Physical Examination: Vital signs in last 24 hours: Temp:  [98.1 F (36.7 C)-99.1 F (37.3 C)] 99.1 F (37.3 C) (05/01 1703) Pulse Rate:  [99-123] 99 (05/01 1703) Resp:  [18-21] 20 (05/01 1703) BP: (139-156)/(62-77) 153/69 mmHg (05/01 1703) SpO2:  [96 %-98 %] 97 % (05/01 1703) Weight:  [190 lb (86.183 kg)-198 lb 3.2 oz (89.903 kg)] 198 lb 3.2 oz (89.903 kg) (05/01 1510) Last BM Date: 12/30/12  General: Well-nourished, well-developed in no acute distress.  Head: Normocephalic, atraumatic.   Eyes: Conjunctiva pink, no icterus. Mouth: Oropharyngeal mucosa moist and pink , no lesions erythema or exudate. Neck: Supple without thyromegaly, masses, or lymphadenopathy.  Lungs: Clear to auscultation bilaterally.  Heart: Regular rate and rhythm, no murmurs rubs or gallops.  Abdomen: Bowel sounds are normal, nontender, nondistended, no hepatosplenomegaly or masses, no  abdominal bruits or    hernia , no rebound or guarding.   Rectal: not performed Extremities: No lower extremity edema, clubbing, deformity.  Neuro: Alert and oriented x 4 , grossly normal neurologically.  Skin: Warm and dry, no rash or jaundice.   Psych: Alert and cooperative, normal mood and affect.        Intake/Output from previous day:   Intake/Output this shift: Total I/O In: 512.5 [I.V.:500; Blood:12.5] Out: -   Lab Results: CBC  Recent Labs  12/31/12 1255  WBC 10.3  HGB 5.3*  HCT 20.3*  MCV 60.2*  PLT 387   BMET  Recent Labs  12/31/12 1255  NA 135  K 3.4*  CL 97  CO2 26  GLUCOSE 191*  BUN 17  CREATININE 0.85  CALCIUM 9.1   LFT  Recent Labs  12/31/12 1303  BILITOT 0.3  BILIDIR <0.1  IBILI NOT CALCULATED  ALKPHOS 94  AST 16  ALT 14  PROT 7.4  ALBUMIN 3.9     PT/INR  Recent Labs  12/31/12 1303  LABPROT 12.4  INR 0.93      Imaging Studies: Dg Chest 2 View  12/29/2012  *RADIOLOGY REPORT*  Clinical Data: Shortness of breath for 1 month.  Smoker.  CHEST - 2 VIEW  Comparison: None.  Findings: Lateral view degraded by patient arm position.  Right upper quadrant surgical clips.  Midline trachea.  Borderline cardiomegaly, with atherosclerosis in the transverse aorta. No pleural effusion or pneumothorax.  A moderate-to-large hiatal hernia. Clear lungs.  IMPRESSION:  1. No acute cardiopulmonary disease. 2.  Moderate-to-large hiatal hernia. 3.  Borderline cardiomegaly, with atherosclerosis in the transverse aorta.   Original Report Authenticated By: Jeronimo Greaves, M.D.   Pierre.Alas week]   Impression: 67 y/o female with profound microcytic anemia, hemoccult negative stool. Last colonoscopy five years ago and reportedly negative. C/O chronic GERD, uncontrolled on omeprazole. Occasional NSAID use. FH significant for colon cancer in mother and brother. She needs to be evaluated for colon cancer. If negative, consider EGD +/- small bowel biopsy for  celiac.  Plan: 1. Transfuse prbcs today. 2. Clear liquid diet. NPO after midnight. 3. Colonoscopy +/- EGD with Dr. Darrick Penna in the morning.   I would like to thank Dr. Irene Limbo for allowing [place] to take part in the care of this nice patient.    LOS: 0 days   Tana Coast  12/31/2012, 5:23 PM   Discuss with Dr. Irene Limbo earlier this afternoon. Patient seen and examined. Agree with the need for colonoscopy and possible EGD to further evaluate her presentation. Patient understands Dr. Darrick Penna will perform in my absence tomorrow. The risks, benefits, limitations, imponderables and alternatives regarding both EGD and colonoscopy have been reviewed with the patient. Questions have been answered. All parties agreeable.

## 2012-12-31 NOTE — ED Notes (Signed)
Per hospitalist, pt putting pt on regular medical floor.

## 2012-12-31 NOTE — ED Notes (Addendum)
Pt sent from Dr Beatrice Lecher office  With low hgb, sob,No chest pain, Hx of heart murmur.

## 2012-12-31 NOTE — Progress Notes (Signed)
12/31/12 1829 Fall prevention/safety plan reviewed with patient. Instructed to call for assistance if any dizziness of weakness. Denies at this time, states will call as needed. Up to chair, call light within reach. Red nonslip socks on for safety. Earnstine Regal, RN

## 2013-01-01 ENCOUNTER — Encounter (HOSPITAL_COMMUNITY)
Admission: EM | Disposition: A | Discharge status: Home / Self Care | Payer: Self-pay | Source: Home / Self Care | Attending: Family Medicine

## 2013-01-01 ENCOUNTER — Encounter (HOSPITAL_COMMUNITY): Payer: Self-pay | Admitting: *Deleted

## 2013-01-01 DIAGNOSIS — E119 Type 2 diabetes mellitus without complications: Secondary | ICD-10-CM | POA: Diagnosis not present

## 2013-01-01 DIAGNOSIS — K2960 Other gastritis without bleeding: Secondary | ICD-10-CM | POA: Diagnosis not present

## 2013-01-01 DIAGNOSIS — D509 Iron deficiency anemia, unspecified: Secondary | ICD-10-CM | POA: Diagnosis not present

## 2013-01-01 DIAGNOSIS — K2980 Duodenitis without bleeding: Secondary | ICD-10-CM | POA: Diagnosis not present

## 2013-01-01 DIAGNOSIS — K922 Gastrointestinal hemorrhage, unspecified: Secondary | ICD-10-CM | POA: Diagnosis not present

## 2013-01-01 HISTORY — PX: ESOPHAGOGASTRODUODENOSCOPY: SHX5428

## 2013-01-01 HISTORY — PX: COLONOSCOPY: SHX5424

## 2013-01-01 LAB — BASIC METABOLIC PANEL WITH GFR
BUN: 9 mg/dL (ref 6–23)
CO2: 27 meq/L (ref 19–32)
Calcium: 8.8 mg/dL (ref 8.4–10.5)
Chloride: 102 meq/L (ref 96–112)
Creatinine, Ser: 0.73 mg/dL (ref 0.50–1.10)
GFR calc Af Amer: >90 mL/min
GFR calc non Af Amer: 88 mL/min — ABNORMAL LOW
Glucose, Bld: 112 mg/dL — ABNORMAL HIGH (ref 70–99)
Potassium: 3.6 meq/L (ref 3.5–5.1)
Sodium: 138 meq/L (ref 135–145)

## 2013-01-01 LAB — IRON AND TIBC: UIBC: >575 ug/dL — ABNORMAL HIGH (ref 125–400)

## 2013-01-01 LAB — TYPE AND SCREEN: Antibody Screen: NEGATIVE

## 2013-01-01 LAB — CBC
HCT: 26 % — ABNORMAL LOW (ref 36.0–46.0)
Hemoglobin: 7.8 g/dL — ABNORMAL LOW (ref 12.0–15.0)
MCH: 20 pg — ABNORMAL LOW (ref 26.0–34.0)
MCHC: 30 g/dL (ref 30.0–36.0)
MCV: 66.7 fL — ABNORMAL LOW (ref 78.0–100.0)
Platelets: 324 K/uL (ref 150–400)
RBC: 3.9 MIL/uL (ref 3.87–5.11)
RDW: 25.4 % — ABNORMAL HIGH (ref 11.5–15.5)
WBC: 9.9 K/uL (ref 4.0–10.5)

## 2013-01-01 LAB — FOLATE: Folate: 18.5 ng/mL

## 2013-01-01 LAB — GLUCOSE, CAPILLARY: Glucose-Capillary: 130 mg/dL — ABNORMAL HIGH (ref 70–99)

## 2013-01-01 SURGERY — COLONOSCOPY
Anesthesia: Moderate Sedation

## 2013-01-01 MED ORDER — OMEPRAZOLE 40 MG PO CPDR: 40.0000 mg | DELAYED_RELEASE_CAPSULE | Freq: Two times a day (BID) | ORAL | Status: DC

## 2013-01-01 MED ORDER — FERROUS SULFATE 325 (65 FE) MG PO TABS: 325.0000 mg | ORAL_TABLET | Freq: Every day | ORAL | Status: DC

## 2013-01-01 MED ORDER — PANTOPRAZOLE SODIUM 40 MG PO TBEC: 40.0000 mg | DELAYED_RELEASE_TABLET | Freq: Two times a day (BID) | ORAL | Status: DC

## 2013-01-01 MED ORDER — BUTAMBEN-TETRACAINE-BENZOCAINE 2-2-14 % EX AERO
INHALATION_SPRAY | CUTANEOUS | Status: DC | PRN
  Administered 2013-01-01: 2 via TOPICAL

## 2013-01-01 MED ORDER — MEPERIDINE HCL 100 MG/ML IJ SOLN
INTRAMUSCULAR | Status: DC | PRN
  Administered 2013-01-01 (×4): 25 mg via INTRAVENOUS

## 2013-01-01 MED ORDER — SODIUM CHLORIDE 0.9 % IV SOLN
INTRAVENOUS | Status: DC
  Administered 2013-01-01: 11:00:00 via INTRAVENOUS

## 2013-01-01 MED ORDER — MIDAZOLAM HCL 5 MG/5ML IJ SOLN
INTRAMUSCULAR | Status: AC
  Filled 2013-01-01: qty 10

## 2013-01-01 MED ORDER — MEPERIDINE HCL 100 MG/ML IJ SOLN
INTRAMUSCULAR | Status: AC
  Filled 2013-01-01: qty 2

## 2013-01-01 MED ORDER — STERILE WATER FOR IRRIGATION IR SOLN
Status: DC | PRN
  Administered 2013-01-01: 11:00:00

## 2013-01-01 MED ORDER — MIDAZOLAM HCL 5 MG/5ML IJ SOLN
INTRAMUSCULAR | Status: DC | PRN
  Administered 2013-01-01 (×3): 1 mg via INTRAVENOUS
  Administered 2013-01-01: 2 mg via INTRAVENOUS
  Administered 2013-01-01: 1 mg via INTRAVENOUS

## 2013-01-01 NOTE — Progress Notes (Signed)
Patient completed bowel prep with no complications.

## 2013-01-01 NOTE — Progress Notes (Signed)
Patient seen, independently examined and chart reviewed. I agree with exam, assessment and plan discussed with Toya Smothers, NP.  Subjective: Feels fine. No complaints. No bleeding.  Objective: Afebrile, vital signs stable. Appears well. Cardiovascular regular rate and rhythm. Respiratory clear to auscultation bilaterally. No wheezes, rales, rhonchi. Normal respiratory effort.  Labs: Hemoglobin 7.8. Colonoscopy revealed mild diverticulosis. EGD revealed moderate-sized paraesophageal hernia. Mild erosive gastritis.  Acute issues:  Profound iron deficiency anemia  Diabetes mellitus type 2, controlled  Plan:  Discussed with Dr. Darrick Penna. Okay for discharge today.   Start iron  Increase PPI to twice a day  Low fat soft mechanical diet  Consider outpatient capsule endoscopy, followup on GI biopsy.  Followup next week as an outpatient. Patient was counseled to monitor for bleeding, increased shortness of breath or changes to condition.  Summary: 67 year old woman presented to the emergency department for anemia. Hemoglobin was found to be 5.3 and patient was admitted for further evaluation. She was transfused 2 units packed red blood cells with appropriate response. She underwent upper and lower endoscopy which was unrevealing. She had no evidence of bleeding and per GI was stable for discharge.   Brendia Sacks, MD [facility] [phone]

## 2013-01-01 NOTE — Progress Notes (Signed)
Pt a/o.vss. Up ad lib. No complaints of any distress. Discharge instructions given. Prescriptions given. Pt verbalized understanding of instructions. Pt left floor via wheelchair and discharged home.

## 2013-01-01 NOTE — Progress Notes (Signed)
[facility] PROGRESS NOTE  Courtney Dorsey WUJ:[id number] DOB: 1945/10/11 DOA: 12/31/2012 PCP: Colette Ribas, MD  Assessment/Plan: 1. Marked microcytic anemia, suspect subacute GI bleed: Stable hemodynamics and microcytosis suggest subacute process. Guaiac negative. S/P 2 units PRBC's. HG 7.8 this am. For colonoscopy today per GI. Appreciate assistance. continue PPI. Anemia panel yields iron 10, folate 18.5 and ferritin 1.  2. Hypokalemia: Repleted and resolved. monitor 3. Diabetes mellitus type 2: CBG range 104-130. Sliding scale insulin. Resume metformin on discharge. 4. Hypertension: fair control.Holding home HCTZ for now.   5. Cigarette smoker: Nicotine patch. She is trying to quit.  Code Status: full Family Communication:  Disposition Plan: home when ready likely tomorrow   Consultants:  GI  Procedures:  Colonoscopy 01/01/13  Antibiotics:  none  HPI/Subjective: Sitting on side of bed. Denies pain/discomfort/dizziness.   Objective: Filed Vitals:   12/31/12 2047 12/31/12 2130 12/31/12 2250 01/01/13 0743  BP: 141/69 141/79 146/75 144/57  Pulse: 95 94 92 99  Temp: 97.4 F (36.3 C) 97.6 F (36.4 C) 98.1 F (36.7 C) 98.1 F (36.7 C)  TempSrc: Oral Oral Oral Oral  Resp: 20 20  20   Height:      Weight:      SpO2: 96% 98% 98% 97%    Intake/Output Summary (Last 24 hours) at 01/01/13 0840 Last data filed at 12/31/12 2315  Gross per 24 hour  Intake 894.25 ml  Output      0 ml  Net 894.25 ml   Filed Weights   12/31/12 1245 12/31/12 1510  Weight: 86.183 kg (190 lb) 89.903 kg (198 lb 3.2 oz)    Exam:   General:  Obese, cooperative NAD  Cardiovascular: RRR no MGR no LE edema   Respiratory: normal effort BSCTAB no rhonchi no wheeze  Abdomen: obese soft +BS non-tender to palpation  Musculoskeletal: joint without swelling/erythema. No clubbing no cyanosis  Data Reviewed: Basic Metabolic Panel:  Recent Labs Lab 12/31/12 1255 01/01/13 0438  NA  135 138  K 3.4* 3.6  CL 97 102  CO2 26 27  GLUCOSE 191* 112*  BUN 17 9  CREATININE 0.85 0.73  CALCIUM 9.1 8.8   Liver Function Tests:  Recent Labs Lab 12/31/12 1303  AST 16  ALT 14  ALKPHOS 94  BILITOT 0.3  PROT 7.4  ALBUMIN 3.9   No results found for this basename: LIPASE, AMYLASE,  in the last 168 hours No results found for this basename: AMMONIA,  in the last 168 hours CBC:  Recent Labs Lab 12/31/12 1255 01/01/13 0438  WBC 10.3 9.9  NEUTROABS 7.1  --   HGB 5.3* 7.8*  HCT 20.3* 26.0*  MCV 60.2* 66.7*  PLT 387 324   Cardiac Enzymes: No results found for this basename: CKTOTAL, CKMB, CKMBINDEX, TROPONINI,  in the last 168 hours BNP (last 3 results) No results found for this basename: PROBNP,  in the last 8760 hours CBG:  Recent Labs Lab 12/31/12 1712 12/31/12 2046 01/01/13 0724  GLUCAP 104* 124* 130*    No results found for this or any previous visit (from the past 240 hour(s)).   Studies: No results found.  Scheduled Meds: . atorvastatin  40 mg Oral q1800  . enalapril  10 mg Oral BID  . insulin aspart  0-9 Units Subcutaneous TID WC  . pantoprazole (PROTONIX) IV  40 mg Intravenous Q12H  . sodium chloride  3 mL Intravenous Q12H  . sodium chloride  3 mL Intravenous Q12H   Continuous  Infusions:   Principal Problem:   Microcytic anemia Active Problems:   Diabetes mellitus type 2, controlled   GI bleed    Time spent: 30 minutes    [facility] M  [facility] Pager [phone]. If 7PM-7AM, please contact night-coverage at [email or web address], password [facility] 01/01/2013, 8:40 AM  LOS: 1 day

## 2013-01-01 NOTE — Progress Notes (Signed)
UR Chart Review Completed  

## 2013-01-01 NOTE — Op Note (Addendum)
[facility] [address] [place], [address]   ENDOSCOPY PROCEDURE REPORT  PATIENT: Courtney Dorsey, Courtney Dorsey  MR#: [id number] BIRTHDATE: Aug 25, 1946 , 67  yrs. old GENDER: Female  ENDOSCOPIST: Jonette Eva, MD REFERRED NG:EXBM Phillips Odor, M.D.  Roetta Sessions, M.D. PROCEDURE DATE: 01/01/2013 PROCEDURE:   EGD w/ biopsy INDICATIONS:Iron deficiency anemia. MEDICATIONS: TCS+ Demerol 25 mg IV and Versed 1mg  IV TOPICAL ANESTHETIC:   Cetacaine Spray  DESCRIPTION OF PROCEDURE:     Physical exam was performed.  Informed consent was obtained from the patient after explaining the benefits, risks, and alternatives to the procedure.  The patient was connected to the monitor and placed in the left lateral position.  Continuous oxygen was provided by nasal cannula and IV medicine administered through an indwelling cannula.  After administration of sedation, the patients esophagus was intubated and the EG-[id number] ([id number])  endoscope was advanced under direct visualization to the second portion of the duodenum.  The scope was removed slowly by carefully examining the color, texture, anatomy, and integrity of the mucosa on the way out.  The patient was recovered in endoscopy and discharged home in satisfactory condition.   ESOPHAGUS: The mucosa of the esophagus appeared normal.   A small hiatal hernia was noted.   STOMACH: MODERATE SIZE PARA-ESOPHAGEAL HERNIA.   Moderate erosive gastritis (inflammation) was found in the gastric antrum and gastric fundus.  Multiple biopsies were performed using cold forceps.   DUODENUM: The duodenal mucosa showed no abnormalities in the bulb and second portion of the duodenum.  Cold forcep biopsies were taken in the second portion.  COMPLICATIONS:   None  ENDOSCOPIC IMPRESSION: 1.  Small hiatal hernia 3.   MODERATE SIZE PARA-ESOPHAGEAL HERNIA 4.   MILD Erosive gastritis  RECOMMENDATIONS: AWAIT BIOPSY AVOID ASA & NSAIDs BID PPI LOW FAT SOFT  MECHANICAL DIET CONSIDER EGD/CAPSULE ENDOSCOPY PLACEMENT IF NO SOURCE FOR ANEMIA IDENTIFIED ON BIOPSY OK TO D/C HOME MAY 2    REPEAT EXAM:   _______________________________ Rosalie DoctorJonette Eva, MD 01/01/2013 2:42 PM Revised: 01/01/2013 2:42 PM      PATIENT NAME:  Courtney Dorsey MR#: [id number]

## 2013-01-01 NOTE — H&P (Signed)
  Primary Care Physician:  Colette Ribas, MD Primary Gastroenterologist:  Dr. Darrick Penna  Pre-Procedure History & Physical: HPI:  Courtney Dorsey is a 67 y.o. female here for  ANEMIA.   Past Medical History  Diagnosis Date  . Diabetes mellitus without complication   . Anemia   . Heart murmur   . Hypertension   . Hypercholesteremia   . Diverticulosis     Past Surgical History  Procedure Laterality Date  . Cholecystectomy    . Colonoscopy  2009    Dr. Franky Macho    Prior to Admission medications   Medication Sig Start Date End Date Taking? Authorizing Provider  atorvastatin (LIPITOR) 40 MG tablet Take 40 mg by mouth daily.   Yes Historical Provider, MD  ENALAPRIL MALEATE PO Take 10 mg by mouth 2 (two) times daily.    Yes Historical Provider, MD  hydrochlorothiazide (HYDRODIURIL) 25 MG tablet Take 25 mg by mouth daily before breakfast.   Yes Historical Provider, MD  ibuprofen (ADVIL,MOTRIN) 800 MG tablet Take 800 mg by mouth as needed for pain.   Yes Historical Provider, MD  metFORMIN (GLUCOPHAGE) 500 MG tablet Take 250 mg by mouth 2 (two) times daily with a meal.   Yes Historical Provider, MD  omeprazole (PRILOSEC) 40 MG capsule Take 40 mg by mouth daily.   Yes Historical Provider, MD    Allergies as of 12/31/2012  . (No Known Allergies)    Family History  Problem Relation Age of Onset  . Colon cancer Mother     diagnosed around age 65  . Colon cancer Brother     age 90  . Liver disease Neg Hx   . Lung cancer Neg Hx   . Breast cancer Neg Hx   . Ovarian cancer Neg Hx   . Celiac disease Neg Hx     History   Social History  . Marital Status: Married    Spouse Name: N/A    Number of Children: 1  . Years of Education: N/A   Occupational History  .     Social History Main Topics  . Smoking status: Current Every Day Smoker  . Smokeless tobacco: Never Used     Comment: trying to quit, on the patch  . Alcohol Use: No  . Drug Use: No  . Sexually Active: Yes     Birth Control/ Protection: Post-menopausal   Other Topics Concern  . Not on file   Social History Narrative  . No narrative on file    Review of Systems: See HPI, otherwise negative ROS   Physical Exam: BP 141/62  Pulse 95  Temp(Src) 98.1 F (36.7 C) (Oral)  Resp 15  Ht 5\' 2"  (1.575 m)  Wt 198 lb (89.812 kg)  BMI 36.21 kg/m2  SpO2 93% General:   Alert,  pleasant and cooperative in NAD Head:  Normocephalic and atraumatic. Neck:  Supple; Lungs:  Clear throughout to auscultation.    Heart:  Regular rate and rhythm. Abdomen:  Soft, nontender and nondistended. Normal bowel sounds, without guarding, and without rebound.   Neurologic:  Alert and  oriented x4;  grossly normal neurologically.  Impression/Plan:     Anemia/BRBPR  PLAN:  1. TCS/?EGD TODAY

## 2013-01-01 NOTE — Op Note (Addendum)
[facility] [address] [place], [address]   COLONOSCOPY PROCEDURE REPORT  PATIENT: Courtney Dorsey, Courtney Dorsey  MR#: [id number] BIRTHDATE: [birth year] , 67  yrs. old GENDER: Female ENDOSCOPIST: Jonette Eva, MD REFERRED XB:JYNW Phillips Odor, M.D.  Roetta Sessions, M.D. PROCEDURE DATE:  01/01/2013 PROCEDURE:   Colonoscopy, diagnostic INDICATIONS:Iron Deficiency Anemia. PRESENTED WITH HB 5.3 MCV 60.2 AND FERRITIN 1 ON ASA, EXCEDRIN MIGRAINE, AND IBUPROFEN. NO BRBPR OR MELENA. LAST TCS 2009 MEDICATIONS: Demerol 75 mg IV and Versed 5 mg IV  DESCRIPTION OF PROCEDURE:    Physical exam was performed.  Informed consent was obtained from the patient after explaining the benefits, risks, and alternatives to procedure.  The patient was connected to monitor and placed in left lateral position. Continuous oxygen was provided by nasal cannula and IV medicine administered through an indwelling cannula.  After administration of sedation and rectal exam, the patients rectum was intubated and the [id number] ([id number])  colonoscope was advanced under direct visualization to the ileum.  The scope was removed slowly by carefully examining the color, texture, anatomy, and integrity mucosa on the way out.  The patient was recovered in endoscopy and discharged home in satisfactory condition.       COLON FINDINGS: The mucosa appeared normal in the terminal ileum.  10-15 CM VISUALIZED. Mild diverticulosis was noted in the descending colon and sigmoid colon.  , The colon mucosa was otherwise normal.  , and Moderate sized internal hemorrhoids were found.  PREP QUALITY: good.  CECAL W/D TIME: 14 minutes      COMPLICATIONS: None  ENDOSCOPIC IMPRESSION: 1.   Normal mucosa in the terminal ileum 2.   Mild diverticulosis in the descending colon and sigmoid colon 3.   Moderate sized internal hemorrhoids  RECOMMENDATIONS: NO SOUREC FOR ANEMIA IDENTIFIED, PROCEED TO EGD HIGH FIBER DIET TCS IN 10  YEARS       _______________________________ Rosalie DoctorJonette Eva, MD 01/01/2013 2:40 PM Revised: 01/01/2013 2:40 PM

## 2013-01-01 NOTE — Discharge Summary (Signed)
Physician Discharge Summary  Courtney Dorsey QMV:[id number] DOB: [birth year] DOA: 12/31/2012  PCP: Colette Ribas, MD  Admit date: 12/31/2012 Discharge date: 01/01/2013  Time spent: 40 minutes  Recommendations for Outpatient Follow-up:  1. Dr Darrick Penna 2 weeks   Discharge Diagnoses:  Principal Problem:   Microcytic anemia Active Problems:   Diabetes mellitus type 2, controlled   GI bleed   Discharge Condition: stable  Diet recommendation: carb modified  Filed Weights   12/31/12 1245 12/31/12 1510 01/01/13 1051  Weight: 86.183 kg (190 lb) 89.903 kg (198 lb 3.2 oz) 89.812 kg (198 lb)    History of present illness:  67 year old woman presented to the emergency department on 12/31/12 for anemia. Hemoglobin was found to be 5.3 and patient was admitted for further evaluation.  History obtained from patient at bedside. For the previous 2 months she had progressive dyspnea on exertion. She had done fairly well with this but now becomes short of breath when vacuuming, requiring rest. She  had no chest pain or other symptoms. She  had no bleeding that she was aware of. She reported having one dark stool sometime ago. She had colonoscopy approximately 5 years ago which she reported to have been unremarkable. She  takes ibuprofen. No syncope. She saw her primary care physician 2 days prior for her increasing shortness of breath. Chest x-ray at that time was negative. Blood work was obtained with result of marked anemia. She was contacted by her primary care physician and referred to the emergency department.  In the emergency department she was noted to be afebrile, mildly tachycardic, normotensive. No hypoxia. She was able to ambulate to the bathroom without difficulty.      Hospital Course:  1. Marked microcytic anemia, suspect subacute GI bleed: Admitted to floor. GI consult obtained. Stable hemodynamics and microcytosis suggest subacute process. Guaiac negative. S/P 2 units PRBC's. HG 7.8 on  day of discharge. Colonoscopy and EGD on 01/01/13. Colonoscopy yields normal mucosa in the terminal ileum with mild diverticulosis in the descending colon and sigmoid colon moderate sized internal hemorrhoids. EGD with biopsy yields small hiatal hernia, moderate size para esophageal hernia with mild erosive gastritis. Recommendations include avoiding ASA and NSAIDS, low fat diet, continue PPI but increase to BID add iron supplementation . May need to consider EGD;capsule endoscopy placement if no source for anemia identified on biopsy.  Anemia panel yields iron 10, folate 18.5 and ferritin 1. GI to follow biopsy results.  2. Hypokalemia: Repleted and resolved. 3. Diabetes mellitus type 2: CBG range 104-130. Sliding scale insulin used while in hospital. Resumed metformin on discharge. 4. Hypertension: fair control. Resume home meds  5. Cigarette smoker: Nicotine patch. She is trying to quit.     Procedures:  EGD 01/01/13  Colonoscopy 01/01/13  Consultations:  GI  Discharge Exam: Filed Vitals:   01/01/13 1155 01/01/13 1200 01/01/13 1205 01/01/13 1210  BP: 161/85 128/71 134/66   Pulse: 106 101 98   Temp:      TempSrc:      Resp: 18 17 18    Height:      Weight:      SpO2: 98% 98% 97% 97%    General: awake alert NAD Cardiovascular: RRR No MGR No LE edema Respiratory: normal effort BS clear bilaterally Abdomen: soft +BS non-tender to palpation  Discharge Instructions  Discharge Orders   Future Orders Complete By Expires     Call MD for:  difficulty breathing, headache or visual disturbances  As directed  Call MD for:  persistant nausea and vomiting  As directed     Diet Carb Modified  As directed     Increase activity slowly  As directed         Medication List    STOP taking these medications       ibuprofen 800 MG tablet  Commonly known as:  ADVIL,MOTRIN      TAKE these medications       atorvastatin 40 MG tablet  Commonly known as:  LIPITOR  Take 40 mg by mouth  daily.     ENALAPRIL MALEATE PO  Take 10 mg by mouth 2 (two) times daily.     ferrous sulfate 325 (65 FE) MG tablet  Take 1 tablet (325 mg total) by mouth daily with breakfast.     hydrochlorothiazide 25 MG tablet  Commonly known as:  HYDRODIURIL  Take 25 mg by mouth daily before breakfast.     metFORMIN 500 MG tablet  Commonly known as:  GLUCOPHAGE  Take 250 mg by mouth 2 (two) times daily with a meal.     omeprazole 40 MG capsule  Commonly known as:  PRILOSEC  Take 1 capsule (40 mg total) by mouth 2 (two) times daily.       No Known Allergies    The results of significant diagnostics from this hospitalization (including imaging, microbiology, ancillary and laboratory) are listed below for reference.    Significant Diagnostic Studies: Dg Chest 2 View  12/29/2012  *RADIOLOGY REPORT*  Clinical Data: Shortness of breath for 1 month.  Smoker.  CHEST - 2 VIEW  Comparison: None.  Findings: Lateral view degraded by patient arm position.  Right upper quadrant surgical clips.  Midline trachea.  Borderline cardiomegaly, with atherosclerosis in the transverse aorta. No pleural effusion or pneumothorax.  A moderate-to-large hiatal hernia. Clear lungs.  IMPRESSION:  1. No acute cardiopulmonary disease. 2.  Moderate-to-large hiatal hernia. 3.  Borderline cardiomegaly, with atherosclerosis in the transverse aorta.   Original Report Authenticated By: Jeronimo Greaves, M.D.     Microbiology: No results found for this or any previous visit (from the past 240 hour(s)).   Labs: Basic Metabolic Panel:  Recent Labs Lab 12/31/12 1255 01/01/13 0438  NA 135 138  K 3.4* 3.6  CL 97 102  CO2 26 27  GLUCOSE 191* 112*  BUN 17 9  CREATININE 0.85 0.73  CALCIUM 9.1 8.8   Liver Function Tests:  Recent Labs Lab 12/31/12 1303  AST 16  ALT 14  ALKPHOS 94  BILITOT 0.3  PROT 7.4  ALBUMIN 3.9   No results found for this basename: LIPASE, AMYLASE,  in the last 168 hours No results found for this  basename: AMMONIA,  in the last 168 hours CBC:  Recent Labs Lab 12/31/12 1255 01/01/13 0438  WBC 10.3 9.9  NEUTROABS 7.1  --   HGB 5.3* 7.8*  HCT 20.3* 26.0*  MCV 60.2* 66.7*  PLT 387 324   Cardiac Enzymes: No results found for this basename: CKTOTAL, CKMB, CKMBINDEX, TROPONINI,  in the last 168 hours BNP: BNP (last 3 results) No results found for this basename: PROBNP,  in the last 8760 hours CBG:  Recent Labs Lab 12/31/12 1712 12/31/12 2046 01/01/13 0724  GLUCAP 104* 124* 130*       Signed:  Valen Mascaro M  [facility] 01/01/2013, 2:55 PM

## 2013-01-01 NOTE — Discharge Summary (Addendum)
Agree with discharge. See my progress note.  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  Brendia Sacks, MD [facility] [phone]

## 2013-01-01 NOTE — Care Management Note (Unsigned)
    Page 1 of 1   01/01/2013     3:13:19 PM   CARE MANAGEMENT NOTE 01/01/2013  Patient:  Courtney Dorsey, Courtney Dorsey   Account Number:  [id number]  Date Initiated:  01/01/2013  Documentation initiated by:  Anibal Henderson  Subjective/Objective Assessment:   Admitted with anemia- Hgb 5.3, and received 2 units blood. Pt is from home with spouse and family and is independent. She will return home at D/C     Action/Plan:   No needs identified   Anticipated DC Date:  01/02/2013   Anticipated DC Plan:  HOME/SELF CARE      DC Planning Services  CM consult      Choice offered to / List presented to:             Status of service:   Medicare Important Message given?   (If response is "NO", the following Medicare IM given date fields will be blank) Date Medicare IM given:   Date Additional Medicare IM given:    Discharge Disposition:    Per UR Regulation:  Reviewed for med. necessity/level of care/duration of stay  If discussed at Long Length of Stay Meetings, dates discussed:    Comments:  01/01/13 1500 Anibal Henderson RN

## 2013-01-04 ENCOUNTER — Encounter (HOSPITAL_COMMUNITY): Payer: Self-pay | Admitting: Gastroenterology

## 2013-01-04 LAB — GLUCOSE, CAPILLARY: Glucose-Capillary: 144 mg/dL — ABNORMAL HIGH (ref 70–99)

## 2013-01-06 ENCOUNTER — Telehealth: Payer: Self-pay | Admitting: Gastroenterology

## 2013-01-06 NOTE — Telephone Encounter (Signed)
Please call pt. HER stomach Bx shows gastritis FROM USING ASPIRIN PRODUCTS AND IBUPROFEN. SHE SHOULD FOLLOW UP WITH DR. Jena Gauss TO DISCUSS CONTINUED WORKUP FOR HER PROFOUND IRON DEFICIENCY ANEMIA. SHE WILL LIKELY NEED AN UPPER ENDOSCOPY TO PLACE THE CAMERA PILL.

## 2013-01-07 NOTE — Telephone Encounter (Signed)
Cc PCP 

## 2013-01-08 NOTE — Telephone Encounter (Signed)
LMOM to call.

## 2013-01-11 ENCOUNTER — Other Ambulatory Visit: Payer: Self-pay

## 2013-01-11 ENCOUNTER — Telehealth: Payer: Self-pay | Admitting: Internal Medicine

## 2013-01-11 DIAGNOSIS — D649 Anemia, unspecified: Secondary | ICD-10-CM

## 2013-01-11 NOTE — Telephone Encounter (Signed)
Pt returned call and was informed. Per Darl Pikes, Pt is scheduled to see Tana Coast, PA on 01/27/2013 and per RMR needs CBC prior to OV. Pt is aware and I mailed the order to her to do a couple of days before the OV.

## 2013-01-11 NOTE — Telephone Encounter (Signed)
Pt is aware of OV on 5/28 at 10 with LSL and appt card was mailed. Also, patient is aware to have CBC done prior to OV

## 2013-01-21 DIAGNOSIS — D649 Anemia, unspecified: Secondary | ICD-10-CM | POA: Diagnosis not present

## 2013-01-22 LAB — CBC WITH DIFFERENTIAL/PLATELET
Eosinophils Absolute: 0.4 10*3/uL (ref 0.0–0.7)
Lymphocytes Relative: 24 % (ref 12–46)
Lymphs Abs: 2.1 10*3/uL (ref 0.7–4.0)
MCV: 70.7 fL — ABNORMAL LOW (ref 78.0–100.0)
Neutrophils Relative %: 64 % (ref 43–77)
Platelets: 453 10*3/uL — ABNORMAL HIGH (ref 150–400)
WBC: 8.8 10*3/uL (ref 4.0–10.5)

## 2013-01-26 ENCOUNTER — Encounter: Payer: Self-pay | Admitting: Gastroenterology

## 2013-01-27 ENCOUNTER — Ambulatory Visit (INDEPENDENT_AMBULATORY_CARE_PROVIDER_SITE_OTHER): Payer: Medicare Other | Admitting: Gastroenterology

## 2013-01-27 ENCOUNTER — Encounter: Payer: Self-pay | Admitting: Gastroenterology

## 2013-01-27 VITALS — BP 148/71 | HR 90 | Temp 98.1°F | Ht 62.0 in | Wt 197.4 lb

## 2013-01-27 DIAGNOSIS — D509 Iron deficiency anemia, unspecified: Secondary | ICD-10-CM

## 2013-01-27 NOTE — Patient Instructions (Addendum)
1. I will discuss your blood work with Dr. Jena Gauss and we will let you know if he will need additional small bowel testing.

## 2013-01-27 NOTE — Progress Notes (Addendum)
Primary Care Physician: GOLDING, JOHN CABOT, MD  Primary Gastroenterologist: Michael Rourk, MD    Chief Complaint  Patient presents with  . Follow-up    HPI: Courtney Dorsey is a 67 y.o. female here for f/u of procedures done for profound IDA. Admitted earlier this month with Hgb 5.3 and MCV 60.2. EGD/TCS as outlined below. H/O some Excedrine migraine use, ibuprofen. FH CRC, mother age 54, brother age 70. Denies any GI symptoms. On iron and omeprazole.    . Colonoscopy N/A 01/01/2013    SLF:Normal mucosa in the terminal ileum  Mild diverticulosis in the descending colon and sigmoid colon and sigmoid colon/Moderate sized internal hemorrhoids  . Esophagogastroduodenoscopy N/A 01/01/2013    SLF:Small hiatal hernia/ MODERATE SIZE PARA-ESOPHAGEAL HERNIA/MILD Erosive gastritis. chronic duodenitis c/w peptic duodenitis. no h.pylori or villous atrophy. minimal chronic gastritis.      Current Outpatient Prescriptions  Medication Sig Dispense Refill  . atorvastatin (LIPITOR) 40 MG tablet Take 40 mg by mouth daily.      . ENALAPRIL MALEATE PO Take 10 mg by mouth 2 (two) times daily.       . ferrous sulfate 325 (65 FE) MG tablet Take 1 tablet (325 mg total) by mouth daily with breakfast.  30 tablet  3  . hydrochlorothiazide (HYDRODIURIL) 25 MG tablet Take 25 mg by mouth daily before breakfast.      . metFORMIN (GLUCOPHAGE) 500 MG tablet Take 250 mg by mouth 2 (two) times daily with a meal.      . omeprazole (PRILOSEC) 40 MG capsule Take 1 capsule (40 mg total) by mouth 2 (two) times daily.  60 capsule  0   No current facility-administered medications for this visit.    Allergies as of 01/27/2013  . (No Known Allergies)   Past Medical History  Diagnosis Date  . Diabetes mellitus without complication   . Anemia   . Heart murmur   . Hypertension   . Hypercholesteremia   . Diverticulosis    Past Surgical History  Procedure Laterality Date  . Cholecystectomy    . Colonoscopy  2009    Dr.  Mark Jenkins  . Colonoscopy N/A 01/01/2013    SLF:Normal mucosa in the terminal ileum  Mild diverticulosis in the descending colon and sigmoid colon and sigmoid colon/Moderate sized internal hemorrhoids  . Esophagogastroduodenoscopy N/A 01/01/2013    SLF:Small hiatal hernia/ MODERATE SIZE PARA-ESOPHAGEAL HERNIA/MILD Erosive gastritis. chronic duodenitis c/w peptic duodenitis. no h.pylori or villous atrophy. minimal chronic gastritis.    Family History  Problem Relation Age of Onset  . Colon cancer Mother     diagnosed around age 54  . Colon cancer Brother     age 70  . Liver disease Neg Hx   . Lung cancer Neg Hx   . Breast cancer Neg Hx   . Ovarian cancer Neg Hx   . Celiac disease Neg Hx    History  Substance Use Topics  . Smoking status: Current Every Day Smoker  . Smokeless tobacco: Never Used     Comment: trying to quit, on the patch  . Alcohol Use: No     ROS:  General: Negative for anorexia, weight loss, fever, chills, fatigue, weakness. ENT: Negative for hoarseness, difficulty swallowing , nasal congestion. CV: Negative for chest pain, angina, palpitations, dyspnea on exertion, peripheral edema.  Respiratory: Negative for dyspnea at rest, dyspnea on exertion, cough, sputum, wheezing.  GI: See history of present illness. GU:  Negative for dysuria, hematuria, urinary incontinence, urinary   frequency, nocturnal urination.  Endo: Negative for unusual weight change.    Physical Examination:   BP 148/71  Pulse 90  Temp(Src) 98.1 F (36.7 C) (Oral)  Ht 5' 2" (1.575 m)  Wt 197 lb 6.4 oz (89.54 kg)  BMI 36.1 kg/m2  General: Well-nourished, well-developed in no acute distress.  Eyes: No icterus. Mouth: Oropharyngeal mucosa moist and pink , no lesions erythema or exudate. Lungs: Clear to auscultation bilaterally.  Heart: Regular rate and rhythm, no murmurs rubs or gallops.  Abdomen: Bowel sounds are normal, nontender, nondistended, no hepatosplenomegaly or masses, no  abdominal bruits or hernia , no rebound or guarding.   Extremities: No lower extremity edema. No clubbing or deformities. Neuro: Alert and oriented x 4   Skin: Warm and dry, no jaundice.   Psych: Alert and cooperative, normal mood and affect.  Labs:  Lab Results  Component Value Date   WBC 8.8 01/21/2013   HGB 10.9* 01/21/2013   HCT 37.1 01/21/2013   MCV 70.7* 01/21/2013   PLT 453* 01/21/2013         

## 2013-01-29 ENCOUNTER — Encounter: Payer: Self-pay | Admitting: Gastroenterology

## 2013-01-29 DIAGNOSIS — D509 Iron deficiency anemia, unspecified: Secondary | ICD-10-CM | POA: Insufficient documentation

## 2013-01-29 NOTE — Assessment & Plan Note (Signed)
Profound IDA, heme negative stool. Gastritis/duodenitis in setting of NSAIDs/ASA use. Patient denies ongoing NSAIDs/ASA. Not clear if IDA fully explained by EGD/TCS findings. H/H has improved however.  Hgb from 7.8 to 10.9 in 20 days since discharge. To discuss with Dr. Jena Gauss regarding any need for further w/u such as capsule endoscopy. May be able to monitor H/H prior to further w/u. If Givens needed, per SLF may need EGD with capsule placement.

## 2013-02-01 NOTE — Progress Notes (Signed)
Cc PCP 

## 2013-02-01 NOTE — Progress Notes (Signed)
Please let patient know Dr. Jena Gauss recommends look back at stomach/paraesophgeal hernia (may need surgery)and placement of capsule.  If patient agreeable, schedule EGD with Givens capsule placement towards end of June but before 30 days runs out.

## 2013-02-02 ENCOUNTER — Telehealth: Payer: Self-pay | Admitting: Internal Medicine

## 2013-02-02 ENCOUNTER — Other Ambulatory Visit: Payer: Self-pay | Admitting: Internal Medicine

## 2013-02-02 DIAGNOSIS — K449 Diaphragmatic hernia without obstruction or gangrene: Secondary | ICD-10-CM

## 2013-02-02 NOTE — Telephone Encounter (Signed)
Patient is scheduled for Tuesday June 17th at 8:30 w/RMR and she is aware and I have mailed her instructions

## 2013-02-02 NOTE — Progress Notes (Signed)
Pt is aware, she said it was ok to set it up as long as her insurance will pay for it.  Benedetto Goad, please schedule.

## 2013-02-02 NOTE — Progress Notes (Signed)
Tried to call pt- LMOM 

## 2013-02-02 NOTE — Telephone Encounter (Signed)
This is an RMR pt. Spoke with her and gave her recommendations.   Leighann, please schedule pt for an egd/givens. See separate result note.

## 2013-02-02 NOTE — Telephone Encounter (Signed)
Pt said she was returning JL call from earlier. Please call her back at [phone] (is this a RMR or SF pt?)

## 2013-02-04 ENCOUNTER — Encounter (HOSPITAL_COMMUNITY): Payer: Self-pay | Admitting: Pharmacy Technician

## 2013-02-08 DIAGNOSIS — D509 Iron deficiency anemia, unspecified: Secondary | ICD-10-CM | POA: Diagnosis not present

## 2013-02-08 DIAGNOSIS — Z6836 Body mass index (BMI) 36.0-36.9, adult: Secondary | ICD-10-CM | POA: Diagnosis not present

## 2013-02-16 ENCOUNTER — Ambulatory Visit (HOSPITAL_COMMUNITY)
Admission: RE | Admit: 2013-02-16 | Discharge: 2013-02-16 | Disposition: A | Discharge status: Home / Self Care | Payer: Medicare Other | Source: Ambulatory Visit | Attending: Internal Medicine | Admitting: Internal Medicine

## 2013-02-16 ENCOUNTER — Encounter (HOSPITAL_COMMUNITY): Payer: Self-pay | Admitting: *Deleted

## 2013-02-16 ENCOUNTER — Encounter (HOSPITAL_COMMUNITY)
Admission: RE | Disposition: A | Discharge status: Home / Self Care | Payer: Self-pay | Source: Ambulatory Visit | Attending: Internal Medicine

## 2013-02-16 DIAGNOSIS — K222 Esophageal obstruction: Secondary | ICD-10-CM | POA: Diagnosis not present

## 2013-02-16 DIAGNOSIS — D509 Iron deficiency anemia, unspecified: Secondary | ICD-10-CM | POA: Diagnosis not present

## 2013-02-16 DIAGNOSIS — I10 Essential (primary) hypertension: Secondary | ICD-10-CM | POA: Diagnosis not present

## 2013-02-16 DIAGNOSIS — E119 Type 2 diabetes mellitus without complications: Secondary | ICD-10-CM | POA: Insufficient documentation

## 2013-02-16 DIAGNOSIS — K449 Diaphragmatic hernia without obstruction or gangrene: Secondary | ICD-10-CM | POA: Insufficient documentation

## 2013-02-16 HISTORY — PX: GIVENS CAPSULE STUDY: SHX5432

## 2013-02-16 HISTORY — PX: ESOPHAGOGASTRODUODENOSCOPY: SHX5428

## 2013-02-16 SURGERY — EGD (ESOPHAGOGASTRODUODENOSCOPY)
Anesthesia: Moderate Sedation

## 2013-02-16 MED ORDER — MEPERIDINE HCL 100 MG/ML IJ SOLN
INTRAMUSCULAR | Status: AC
  Filled 2013-02-16: qty 2

## 2013-02-16 MED ORDER — MIDAZOLAM HCL 5 MG/5ML IJ SOLN
INTRAMUSCULAR | Status: DC | PRN
  Administered 2013-02-16: 2 mg via INTRAVENOUS
  Administered 2013-02-16: 1 mg via INTRAVENOUS
  Administered 2013-02-16: 2 mg via INTRAVENOUS

## 2013-02-16 MED ORDER — SODIUM CHLORIDE 0.9 % IV SOLN
INTRAVENOUS | Status: DC
  Administered 2013-02-16: 1000 mL via INTRAVENOUS

## 2013-02-16 MED ORDER — ONDANSETRON HCL 4 MG/2ML IJ SOLN
INTRAMUSCULAR | Status: DC | PRN
  Administered 2013-02-16: 4 mg via INTRAVENOUS

## 2013-02-16 MED ORDER — STERILE WATER FOR IRRIGATION IR SOLN
Status: DC | PRN
  Administered 2013-02-16: 09:00:00

## 2013-02-16 MED ORDER — MIDAZOLAM HCL 5 MG/5ML IJ SOLN
INTRAMUSCULAR | Status: AC
  Filled 2013-02-16: qty 10

## 2013-02-16 MED ORDER — MEPERIDINE HCL 100 MG/ML IJ SOLN
INTRAMUSCULAR | Status: DC | PRN
  Administered 2013-02-16: 50 mg via INTRAVENOUS
  Administered 2013-02-16: 25 mg via INTRAVENOUS

## 2013-02-16 MED ORDER — ONDANSETRON HCL 4 MG/2ML IJ SOLN
INTRAMUSCULAR | Status: AC
  Filled 2013-02-16: qty 2

## 2013-02-16 MED ORDER — BUTAMBEN-TETRACAINE-BENZOCAINE 2-2-14 % EX AERO
INHALATION_SPRAY | CUTANEOUS | Status: DC | PRN
  Administered 2013-02-16: 2 via TOPICAL

## 2013-02-16 NOTE — Interval H&P Note (Signed)
History and Physical Interval Note:  02/16/2013 8:35 AM  Courtney Dorsey  has presented today for surgery, with the diagnosis of stomach/paraesophgeal hernia  The various methods of treatment have been discussed with the patient and family. After consideration of risks, benefits and other options for treatment, the patient has consented to  Procedure(s) with comments: ESOPHAGOGASTRODUODENOSCOPY (EGD) (N/A) - 8:30 GIVENS CAPSULE STUDY (N/A) as a surgical intervention .  The patient's history has been reviewed, patient examined, no change in status, stable for surgery.  I have reviewed the patient's chart and labs.  Questions were answered to the patient's satisfaction.     Courtney Dorsey  Clinically, no GI bleeding. Patient here for capsule placement  and relook at paraesophageal hernia. The risks, benefits, limitations, alternatives and imponderables have been reviewed with the patient. Potential for esophageal dilation, biopsy, etc. have also been reviewed.  Questions have been answered. All parties agreeable.

## 2013-02-16 NOTE — H&P (View-Only) (Signed)
Primary Care Physician: Colette Ribas, MD  Primary Gastroenterologist: Roetta Sessions, MD    Chief Complaint  Patient presents with  . Follow-up    HPI: Courtney Dorsey is a 67 y.o. female here for f/u of procedures done for profound IDA. Admitted earlier this month with Hgb 5.3 and MCV 60.2. EGD/TCS as outlined below. H/O some Excedrine migraine use, ibuprofen. FH CRC, mother age 78, brother age 64. Denies any GI symptoms. On iron and omeprazole.    . Colonoscopy N/A 01/01/2013    ZOX:WRUEAV mucosa in the terminal ileum  Mild diverticulosis in the descending colon and sigmoid colon and sigmoid colon/Moderate sized internal hemorrhoids  . Esophagogastroduodenoscopy N/A 01/01/2013    WUJ:[id number] hiatal hernia/ MODERATE SIZE PARA-ESOPHAGEAL HERNIA/MILD Erosive gastritis. chronic duodenitis c/w peptic duodenitis. no h.pylori or villous atrophy. minimal chronic gastritis.      Current Outpatient Prescriptions  Medication Sig Dispense Refill  . atorvastatin (LIPITOR) 40 MG tablet Take 40 mg by mouth daily.      . ENALAPRIL MALEATE PO Take 10 mg by mouth 2 (two) times daily.       . ferrous sulfate 325 (65 FE) MG tablet Take 1 tablet (325 mg total) by mouth daily with breakfast.  30 tablet  3  . hydrochlorothiazide (HYDRODIURIL) 25 MG tablet Take 25 mg by mouth daily before breakfast.      . metFORMIN (GLUCOPHAGE) 500 MG tablet Take 250 mg by mouth 2 (two) times daily with a meal.      . omeprazole (PRILOSEC) 40 MG capsule Take 1 capsule (40 mg total) by mouth 2 (two) times daily.  60 capsule  0   No current facility-administered medications for this visit.    Allergies as of 01/27/2013  . (No Known Allergies)   Past Medical History  Diagnosis Date  . Diabetes mellitus without complication   . Anemia   . Heart murmur   . Hypertension   . Hypercholesteremia   . Diverticulosis    Past Surgical History  Procedure Laterality Date  . Cholecystectomy    . Colonoscopy  2009    Dr.  Franky Macho  . Colonoscopy N/A 01/01/2013    YNW:GNFAOZ mucosa in the terminal ileum  Mild diverticulosis in the descending colon and sigmoid colon and sigmoid colon/Moderate sized internal hemorrhoids  . Esophagogastroduodenoscopy N/A 01/01/2013    HYQ:MVHQI hiatal hernia/ MODERATE SIZE PARA-ESOPHAGEAL HERNIA/MILD Erosive gastritis. chronic duodenitis c/w peptic duodenitis. no h.pylori or villous atrophy. minimal chronic gastritis.    Family History  Problem Relation Age of Onset  . Colon cancer Mother     diagnosed around age 4  . Colon cancer Brother     age 42  . Liver disease Neg Hx   . Lung cancer Neg Hx   . Breast cancer Neg Hx   . Ovarian cancer Neg Hx   . Celiac disease Neg Hx    History  Substance Use Topics  . Smoking status: Current Every Day Smoker  . Smokeless tobacco: Never Used     Comment: trying to quit, on the patch  . Alcohol Use: No     ROS:  General: Negative for anorexia, weight loss, fever, chills, fatigue, weakness. ENT: Negative for hoarseness, difficulty swallowing , nasal congestion. CV: Negative for chest pain, angina, palpitations, dyspnea on exertion, peripheral edema.  Respiratory: Negative for dyspnea at rest, dyspnea on exertion, cough, sputum, wheezing.  GI: See history of present illness. GU:  Negative for dysuria, hematuria, urinary incontinence, urinary  frequency, nocturnal urination.  Endo: Negative for unusual weight change.    Physical Examination:   BP 148/71  Pulse 90  Temp(Src) 98.1 F (36.7 C) (Oral)  Ht 5\' 2"  (1.575 m)  Wt 197 lb 6.4 oz (89.54 kg)  BMI 36.1 kg/m2  General: Well-nourished, well-developed in no acute distress.  Eyes: No icterus. Mouth: Oropharyngeal mucosa moist and pink , no lesions erythema or exudate. Lungs: Clear to auscultation bilaterally.  Heart: Regular rate and rhythm, no murmurs rubs or gallops.  Abdomen: Bowel sounds are normal, nontender, nondistended, no hepatosplenomegaly or masses, no  abdominal bruits or hernia , no rebound or guarding.   Extremities: No lower extremity edema. No clubbing or deformities. Neuro: Alert and oriented x 4   Skin: Warm and dry, no jaundice.   Psych: Alert and cooperative, normal mood and affect.  Labs:  Lab Results  Component Value Date   WBC 8.8 01/21/2013   HGB 10.9* 01/21/2013   HCT 37.1 01/21/2013   MCV 70.7* 01/21/2013   PLT 453* 01/21/2013

## 2013-02-16 NOTE — Op Note (Signed)
[facility] [address] [place], [address]   ENDOSCOPY PROCEDURE REPORT  PATIENT: Courtney, Dorsey  MR#: [id number] BIRTHDATE: [birth year] , 67  yrs. old GENDER: Female ENDOSCOPIST: R.  Roetta Sessions, MD FACP FACG REFERRED BY:  Assunta Found, M.D. PROCEDURE DATE:  02/16/2013 PROCEDURE:     diagnostic EGD with small bowel capsule placement  INDICATIONS:     Iron deficiency anemia; small bowel capsule placement  INFORMED CONSENT:   The risks, benefits, limitations, alternatives and imponderables have been discussed.  The potential for biopsy, esophogeal dilation, etc. have also been reviewed.  Questions have been answered.  All parties agreeable.  Please see the history and physical in the medical record for more information.  MEDICATIONS: Versed 5 mg IV and Demerol 75 mg IV in divided doses. Zofran 4 mg IV. Cetacaine spray.  DESCRIPTION OF PROCEDURE:   The EG-[id number] ([id number])  endoscope was introduced through the mouth and advanced to the second portion of the duodenum without difficulty or limitations.  The mucosal surfaces were surveyed very carefully during advancement of the scope and upon withdrawal.  Retroflexion view of the proximal stomach and esophagogastric junction was performed.      FINDINGS: Noncritical Schatzki's ring; otherwise, normal esophagus. Stomach empty. Moderate size hiatal hernia. I did not see a paraesophageal component. Normal gastric mucosa.  Patent pylorus. Normal first and second portion of the duodenum.  THERAPEUTIC / DIAGNOSTIC MANEUVERS PERFORMED:  gastroscope withdrawn and capsule deployment device loaded and reintroduced into the stomach; pyloric channel traversed; capsule was deployed  in the second portion of the duodenum without difficulty.  COMPLICATIONS:  None  IMPRESSION:  Noncritical Schatzki's ring. Hiatal hernia. Video capsule deployed into the duodenum as described above.  RECOMMENDATIONS:    Followup on  capsule data as it becomes available.    _______________________________ R. Roetta Sessions, MD FACP [facility] eSigned:  R. Roetta Sessions, MD FACP [facility] 02/16/2013 9:09 AM     CC:

## 2013-02-17 ENCOUNTER — Encounter (HOSPITAL_COMMUNITY): Payer: Self-pay | Admitting: Internal Medicine

## 2013-02-24 ENCOUNTER — Telehealth: Payer: Self-pay | Admitting: Gastroenterology

## 2013-02-24 ENCOUNTER — Ambulatory Visit: Payer: Medicare Other | Admitting: Cardiovascular Disease

## 2013-02-24 DIAGNOSIS — D509 Iron deficiency anemia, unspecified: Secondary | ICD-10-CM | POA: Diagnosis not present

## 2013-02-24 NOTE — Telephone Encounter (Signed)
Please let patient know that her given his capsule endoscopy was unremarkable. At this time, no other explanation for iron deficiency anemia except for previously seen gastritis, duodenitis.  Continue PPI and iron supplement. Continue to avoid aspirin and NSAIDs. Recheck CBC in 4-6 weeks.

## 2013-02-24 NOTE — Op Note (Signed)
Small Bowel Givens Capsule Study Procedure date:  02/16/13  Referring Provider:  Roetta Sessions, MD  PCP:  Dr. Phillips Odor, Chancy Hurter, MD  Indication for procedure:  67 y.o. female with h/o profound IDA. Admitted 12/2012 with Hgb 5.3 and MCV 60.2. EGD/TCS showed moderate size ?para-esophageal hernia/mild erosive gastritis, chronic duodenitis c/w peptic duodenitis. No H.pylori or villous atrophy. Normal terminal ileum, mild diverticulosis, moderate sized internal hemorrhoids.  H/O some Excedrine migraine use, ibuprofen. Repeat EGD on 02/16/13 to reevaluate hernia and place capsule. No paraesophageal hernia appreciated this time.   Patient data:  Wt: 197 lbs Ht: 74 in Waist: 49 in  Findings:  Essentially normal appearing small bowel mucosa aside from likely clinically insignificant distal terminal ileum erythema noted at 4 hr 16 min. No evidence of ulceration, cobblestoning, masses, bleeding.   First Gastric image:  N/A First Duodenal image: 19 min 21 sec First Ileo-Cecal Valve image: 4 hr 18 min 42 sec First Cecal image: 4 hr 21 min 58 sec Gastric Passage time: N/A Small Bowel Passage time:  4hr 2 min  Summary & Recommendations: Unremarkable small bowel capsule endoscopy. Recheck CBC in 4 weeks. Continue iron for now. Continue to avoid ASA/NSAIDs for now.

## 2013-02-25 NOTE — Telephone Encounter (Signed)
Tried to call pt- LMOM 

## 2013-02-26 ENCOUNTER — Other Ambulatory Visit: Payer: Self-pay | Admitting: Gastroenterology

## 2013-02-26 DIAGNOSIS — D509 Iron deficiency anemia, unspecified: Secondary | ICD-10-CM

## 2013-02-26 NOTE — Telephone Encounter (Signed)
Pt aware. Lab order on file

## 2013-03-16 ENCOUNTER — Other Ambulatory Visit: Payer: Self-pay

## 2013-03-16 DIAGNOSIS — D509 Iron deficiency anemia, unspecified: Secondary | ICD-10-CM | POA: Diagnosis not present

## 2013-04-02 DIAGNOSIS — D509 Iron deficiency anemia, unspecified: Secondary | ICD-10-CM | POA: Diagnosis not present

## 2013-04-02 LAB — CBC WITH DIFFERENTIAL/PLATELET
Hemoglobin: 15.9 g/dL — ABNORMAL HIGH (ref 12.0–15.0)
Lymphs Abs: 2.1 10*3/uL (ref 0.7–4.0)
Monocytes Relative: 8 % (ref 3–12)
Neutro Abs: 6.5 10*3/uL (ref 1.7–7.7)
Neutrophils Relative %: 67 % (ref 43–77)
RBC: 5.89 MIL/uL — ABNORMAL HIGH (ref 3.87–5.11)

## 2013-04-05 NOTE — Progress Notes (Signed)
Quick Note:  Anemia resolved. Stop iron.  CBC in 10 weeks. ______

## 2013-04-07 ENCOUNTER — Other Ambulatory Visit: Payer: Self-pay

## 2013-04-13 ENCOUNTER — Other Ambulatory Visit: Payer: Self-pay

## 2013-04-13 DIAGNOSIS — D509 Iron deficiency anemia, unspecified: Secondary | ICD-10-CM

## 2013-05-24 ENCOUNTER — Other Ambulatory Visit: Payer: Self-pay

## 2013-05-24 DIAGNOSIS — D509 Iron deficiency anemia, unspecified: Secondary | ICD-10-CM

## 2013-06-04 DIAGNOSIS — D509 Iron deficiency anemia, unspecified: Secondary | ICD-10-CM | POA: Diagnosis not present

## 2013-06-04 LAB — CBC WITH DIFFERENTIAL/PLATELET
Eosinophils Relative: 3 % (ref 0–5)
HCT: 46.9 % — ABNORMAL HIGH (ref 36.0–46.0)
Lymphocytes Relative: 21 % (ref 12–46)
Lymphs Abs: 2 10*3/uL (ref 0.7–4.0)
MCV: 82.4 fL (ref 78.0–100.0)
Monocytes Absolute: 0.8 10*3/uL (ref 0.1–1.0)
RBC: 5.69 MIL/uL — ABNORMAL HIGH (ref 3.87–5.11)
WBC: 9.7 10*3/uL (ref 4.0–10.5)

## 2013-06-08 NOTE — Progress Notes (Signed)
Quick Note:  Hgb up likely secondary to smoking. No further anemia. Send copy of cbc to PCP. She can f/u with PCP to monitor Hgb, make sure does not continue to go up. ______

## 2013-07-07 ENCOUNTER — Other Ambulatory Visit: Payer: Self-pay | Admitting: Adult Health

## 2013-07-07 DIAGNOSIS — Z139 Encounter for screening, unspecified: Secondary | ICD-10-CM

## 2013-07-08 ENCOUNTER — Other Ambulatory Visit: Payer: Self-pay

## 2013-07-13 ENCOUNTER — Ambulatory Visit (HOSPITAL_COMMUNITY)
Admission: RE | Admit: 2013-07-13 | Discharge: 2013-07-13 | Disposition: A | Discharge status: Home / Self Care | Payer: Medicare Other | Source: Ambulatory Visit | Attending: Adult Health | Admitting: Adult Health

## 2013-07-13 DIAGNOSIS — Z1231 Encounter for screening mammogram for malignant neoplasm of breast: Secondary | ICD-10-CM | POA: Diagnosis not present

## 2013-07-13 DIAGNOSIS — Z139 Encounter for screening, unspecified: Secondary | ICD-10-CM

## 2013-07-21 DIAGNOSIS — H2589 Other age-related cataract: Secondary | ICD-10-CM | POA: Diagnosis not present

## 2013-07-21 DIAGNOSIS — E119 Type 2 diabetes mellitus without complications: Secondary | ICD-10-CM | POA: Diagnosis not present

## 2013-07-21 DIAGNOSIS — H5200 Hypermetropia, unspecified eye: Secondary | ICD-10-CM | POA: Diagnosis not present

## 2013-07-21 DIAGNOSIS — H52229 Regular astigmatism, unspecified eye: Secondary | ICD-10-CM | POA: Diagnosis not present

## 2013-08-13 DIAGNOSIS — E119 Type 2 diabetes mellitus without complications: Secondary | ICD-10-CM | POA: Diagnosis not present

## 2013-08-13 DIAGNOSIS — I10 Essential (primary) hypertension: Secondary | ICD-10-CM | POA: Diagnosis not present

## 2013-08-13 DIAGNOSIS — Z6837 Body mass index (BMI) 37.0-37.9, adult: Secondary | ICD-10-CM | POA: Diagnosis not present

## 2013-08-13 DIAGNOSIS — Z23 Encounter for immunization: Secondary | ICD-10-CM | POA: Diagnosis not present

## 2013-08-16 DIAGNOSIS — I10 Essential (primary) hypertension: Secondary | ICD-10-CM | POA: Diagnosis not present

## 2013-08-16 DIAGNOSIS — E119 Type 2 diabetes mellitus without complications: Secondary | ICD-10-CM | POA: Diagnosis not present

## 2013-08-16 DIAGNOSIS — Z23 Encounter for immunization: Secondary | ICD-10-CM | POA: Diagnosis not present

## 2013-08-16 DIAGNOSIS — E785 Hyperlipidemia, unspecified: Secondary | ICD-10-CM | POA: Diagnosis not present

## 2013-08-19 DIAGNOSIS — J310 Chronic rhinitis: Secondary | ICD-10-CM | POA: Diagnosis not present

## 2013-08-19 DIAGNOSIS — Z6837 Body mass index (BMI) 37.0-37.9, adult: Secondary | ICD-10-CM | POA: Diagnosis not present

## 2013-08-19 DIAGNOSIS — J984 Other disorders of lung: Secondary | ICD-10-CM | POA: Diagnosis not present

## 2014-03-23 ENCOUNTER — Other Ambulatory Visit (HOSPITAL_COMMUNITY): Payer: Self-pay | Admitting: Family Medicine

## 2014-03-23 DIAGNOSIS — Z6836 Body mass index (BMI) 36.0-36.9, adult: Secondary | ICD-10-CM | POA: Diagnosis not present

## 2014-03-23 DIAGNOSIS — I10 Essential (primary) hypertension: Secondary | ICD-10-CM

## 2014-03-23 DIAGNOSIS — E119 Type 2 diabetes mellitus without complications: Secondary | ICD-10-CM

## 2014-03-23 DIAGNOSIS — E785 Hyperlipidemia, unspecified: Secondary | ICD-10-CM

## 2014-03-23 DIAGNOSIS — Z23 Encounter for immunization: Secondary | ICD-10-CM | POA: Diagnosis not present

## 2014-03-24 ENCOUNTER — Other Ambulatory Visit (HOSPITAL_COMMUNITY): Payer: Self-pay | Admitting: Family Medicine

## 2014-03-24 DIAGNOSIS — M810 Age-related osteoporosis without current pathological fracture: Secondary | ICD-10-CM

## 2014-03-24 DIAGNOSIS — D235 Other benign neoplasm of skin of trunk: Secondary | ICD-10-CM | POA: Diagnosis not present

## 2014-03-24 DIAGNOSIS — L538 Other specified erythematous conditions: Secondary | ICD-10-CM | POA: Diagnosis not present

## 2014-03-24 DIAGNOSIS — C44711 Basal cell carcinoma of skin of unspecified lower limb, including hip: Secondary | ICD-10-CM | POA: Diagnosis not present

## 2014-03-24 DIAGNOSIS — L821 Other seborrheic keratosis: Secondary | ICD-10-CM | POA: Diagnosis not present

## 2014-03-30 ENCOUNTER — Ambulatory Visit (HOSPITAL_COMMUNITY)
Admission: RE | Admit: 2014-03-30 | Discharge: 2014-03-30 | Disposition: A | Discharge status: Home / Self Care | Payer: Medicare Other | Source: Ambulatory Visit | Attending: Family Medicine | Admitting: Family Medicine

## 2014-03-30 DIAGNOSIS — M810 Age-related osteoporosis without current pathological fracture: Secondary | ICD-10-CM | POA: Diagnosis not present

## 2014-03-30 DIAGNOSIS — Z780 Asymptomatic menopausal state: Secondary | ICD-10-CM | POA: Diagnosis not present

## 2014-03-30 DIAGNOSIS — Z13820 Encounter for screening for osteoporosis: Secondary | ICD-10-CM | POA: Diagnosis not present

## 2014-04-26 DIAGNOSIS — Z85828 Personal history of other malignant neoplasm of skin: Secondary | ICD-10-CM | POA: Diagnosis not present

## 2014-05-30 DIAGNOSIS — B079 Viral wart, unspecified: Secondary | ICD-10-CM | POA: Diagnosis not present

## 2014-05-30 DIAGNOSIS — Z85828 Personal history of other malignant neoplasm of skin: Secondary | ICD-10-CM | POA: Diagnosis not present

## 2014-06-08 DIAGNOSIS — Z23 Encounter for immunization: Secondary | ICD-10-CM | POA: Diagnosis not present

## 2014-07-13 ENCOUNTER — Other Ambulatory Visit: Payer: Self-pay | Admitting: Obstetrics and Gynecology

## 2014-07-13 DIAGNOSIS — Z1231 Encounter for screening mammogram for malignant neoplasm of breast: Secondary | ICD-10-CM

## 2014-07-15 ENCOUNTER — Ambulatory Visit (HOSPITAL_COMMUNITY)
Admission: RE | Admit: 2014-07-15 | Discharge: 2014-07-15 | Disposition: A | Discharge status: Home / Self Care | Payer: Medicare Other | Source: Ambulatory Visit | Attending: Obstetrics and Gynecology | Admitting: Obstetrics and Gynecology

## 2014-07-15 DIAGNOSIS — Z1231 Encounter for screening mammogram for malignant neoplasm of breast: Secondary | ICD-10-CM | POA: Insufficient documentation

## 2014-07-27 DIAGNOSIS — E119 Type 2 diabetes mellitus without complications: Secondary | ICD-10-CM | POA: Diagnosis not present

## 2014-07-27 DIAGNOSIS — H25813 Combined forms of age-related cataract, bilateral: Secondary | ICD-10-CM | POA: Diagnosis not present

## 2014-07-27 DIAGNOSIS — H02834 Dermatochalasis of left upper eyelid: Secondary | ICD-10-CM | POA: Diagnosis not present

## 2014-07-27 DIAGNOSIS — H02831 Dermatochalasis of right upper eyelid: Secondary | ICD-10-CM | POA: Diagnosis not present

## 2014-10-04 DIAGNOSIS — M7541 Impingement syndrome of right shoulder: Secondary | ICD-10-CM | POA: Diagnosis not present

## 2014-10-25 ENCOUNTER — Ambulatory Visit (INDEPENDENT_AMBULATORY_CARE_PROVIDER_SITE_OTHER): Payer: Medicare Other | Admitting: Obstetrics & Gynecology

## 2014-10-25 ENCOUNTER — Encounter: Payer: Self-pay | Admitting: Obstetrics & Gynecology

## 2014-10-25 ENCOUNTER — Other Ambulatory Visit (HOSPITAL_COMMUNITY)
Admission: RE | Admit: 2014-10-25 | Discharge: 2014-10-25 | Disposition: A | Discharge status: Home / Self Care | Payer: Medicare Other | Source: Ambulatory Visit | Attending: Obstetrics & Gynecology | Admitting: Obstetrics & Gynecology

## 2014-10-25 VITALS — BP 160/100 | HR 80 | Ht 62.0 in | Wt 190.0 lb

## 2014-10-25 DIAGNOSIS — Z124 Encounter for screening for malignant neoplasm of cervix: Secondary | ICD-10-CM | POA: Insufficient documentation

## 2014-10-25 DIAGNOSIS — Z01419 Encounter for gynecological examination (general) (routine) without abnormal findings: Secondary | ICD-10-CM

## 2014-10-25 NOTE — Progress Notes (Signed)
Patient ID: Courtney Dorsey, female   DOB: 05-08-1946, 69 y.o.   MRN: [id number] Subjective:     Courtney Dorsey is a 69 y.o. female here for a routine exam.  No LMP recorded. Patient is postmenopausal. No obstetric history on file. Birth Control Method:  none Menstrual Calendar(currently): none  Current complaints: none.   Current acute medical issues:  All satble   Recent Gynecologic History No LMP recorded. Patient is postmenopausal. Last Pap: 05/07/2012,  normal Last mammogram: 07/2014,  normal  Past Medical History  Diagnosis Date  . Diabetes mellitus without complication   . Anemia   . Heart murmur   . Hypertension   . Hypercholesteremia   . Diverticulosis     Past Surgical History  Procedure Laterality Date  . Cholecystectomy    . Colonoscopy  2009    Dr. Aviva Signs  . Colonoscopy N/A 01/01/2013    JME:QASTMH mucosa in the terminal ileum  Mild diverticulosis in the descending colon and sigmoid colon and sigmoid colon/Moderate sized internal hemorrhoids  . Esophagogastroduodenoscopy N/A 01/01/2013    DQQ:IWLNL hiatal hernia/ MODERATE SIZE PARA-ESOPHAGEAL HERNIA/MILD Erosive gastritis. chronic duodenitis c/w peptic duodenitis. no h.pylori or villous atrophy. minimal chronic gastritis.   . Esophagogastroduodenoscopy N/A 02/16/2013    Procedure: ESOPHAGOGASTRODUODENOSCOPY (EGD);  Surgeon: Daneil Dolin, MD;  Location: [facility];  Service: Endoscopy;  Laterality: N/A;  8:30  . Givens capsule study N/A 02/16/2013    Procedure: GIVENS CAPSULE STUDY;  Surgeon: Daneil Dolin, MD;  Location: [facility];  Service: Endoscopy;  Laterality: N/A;    OB History    No data available      History   Social History  . Marital Status: Married    Spouse Name: N/A  . Number of Children: 1  . Years of Education: N/A   Occupational History  .     Social History Main Topics  . Smoking status: Current Every Day Smoker  . Smokeless tobacco: Never Used     Comment: trying to  quit, on the patch  . Alcohol Use: No  . Drug Use: No  . Sexual Activity: Yes    Birth Control/ Protection: Post-menopausal   Other Topics Concern  . None   Social History Narrative    Family History  Problem Relation Age of Onset  . Colon cancer Mother     diagnosed around age 51  . Colon cancer Brother     age 15  . Liver disease Neg Hx   . Lung cancer Neg Hx   . Breast cancer Neg Hx   . Ovarian cancer Neg Hx   . Celiac disease Neg Hx      Current outpatient prescriptions:  .  atorvastatin (LIPITOR) 40 MG tablet, Take 40 mg by mouth daily., Disp: , Rfl:  .  ENALAPRIL MALEATE PO, Take 10 mg by mouth 2 (two) times daily. , Disp: , Rfl:  .  hydrochlorothiazide (HYDRODIURIL) 25 MG tablet, Take 25 mg by mouth daily before breakfast., Disp: , Rfl:  .  metFORMIN (GLUCOPHAGE) 500 MG tablet, Take 250 mg by mouth 2 (two) times daily with a meal., Disp: , Rfl:  .  omeprazole (PRILOSEC) 40 MG capsule, Take 40 mg by mouth daily., Disp: , Rfl:  .  ferrous sulfate 325 (65 FE) MG tablet, Take 1 tablet (325 mg total) by mouth daily with breakfast. (Patient not taking: Reported on 10/25/2014), Disp: 30 tablet, Rfl: 3  Review of Systems  Review  of Systems  Constitutional: Negative for fever, chills, weight loss, malaise/fatigue and diaphoresis.  HENT: Negative for hearing loss, ear pain, nosebleeds, congestion, sore throat, neck pain, tinnitus and ear discharge.   Eyes: Negative for blurred vision, double vision, photophobia, pain, discharge and redness.  Respiratory: Negative for cough, hemoptysis, sputum production, shortness of breath, wheezing and stridor.   Cardiovascular: Negative for chest pain, palpitations, orthopnea, claudication, leg swelling and PND.  Gastrointestinal: negative for abdominal pain. Negative for heartburn, nausea, vomiting, diarrhea, constipation, blood in stool and melena.  Genitourinary: Negative for dysuria, urgency, frequency, hematuria and flank pain.   Musculoskeletal: Negative for myalgias, back pain, joint pain and falls.  Skin: Negative for itching and rash.  Neurological: Negative for dizziness, tingling, tremors, sensory change, speech change, focal weakness, seizures, loss of consciousness, weakness and headaches.  Endo/Heme/Allergies: Negative for environmental allergies and polydipsia. Does not bruise/bleed easily.  Psychiatric/Behavioral: Negative for depression, suicidal ideas, hallucinations, memory loss and substance abuse. The patient is not nervous/anxious and does not have insomnia.        Objective:  Blood pressure 160/100, pulse 80, height 5\' 2"  (1.575 m), weight 190 lb (86.183 kg).   Physical Exam  Vitals reviewed. Constitutional: She is oriented to person, place, and time. She appears well-developed and well-nourished.  HENT:  Head: Normocephalic and atraumatic.        Right Ear: External ear normal.  Left Ear: External ear normal.  Nose: Nose normal.  Mouth/Throat: Oropharynx is clear and moist.  Eyes: Conjunctivae and EOM are normal. Pupils are equal, round, and reactive to light. Right eye exhibits no discharge. Left eye exhibits no discharge. No scleral icterus.  Neck: Normal range of motion. Neck supple. No tracheal deviation present. No thyromegaly present.  Cardiovascular: Normal rate, regular rhythm, normal heart sounds and intact distal pulses.  Exam reveals no gallop and no friction rub.   No murmur heard. Respiratory: Effort normal and breath sounds normal. No respiratory distress. She has no wheezes. She has no rales. She exhibits no tenderness.  GI: Soft. Bowel sounds are normal. She exhibits no distension and no mass. There is no tenderness. There is no rebound and no guarding.  Genitourinary:  Breasts no masses skin changes or nipple changes bilaterally      Vulva is normal without lesions Vagina is pink moist without discharge Cervix normal in appearance and pap is performed, small match head size  polyp is seen, pt has not had bleeding Uterus is normal size shape and contour Adnexa is negative with normal sized ovaries  Rectal    hemoccult negative, normal tone, no masses  Musculoskeletal: Normal range of motion. She exhibits no edema and no tenderness.  Neurological: She is alert and oriented to person, place, and time. She has normal reflexes. She displays normal reflexes. No cranial nerve deficit. She exhibits normal muscle tone. Coordination normal.  Skin: Skin is warm and dry. No rash noted. No erythema. No pallor.  Psychiatric: She has a normal mood and affect. Her behavior is normal. Judgment and thought content normal.       Assessment:    Healthy female exam.    Plan:    Follow up in: 1 year.

## 2014-10-27 LAB — CYTOLOGY - PAP

## 2014-11-01 DIAGNOSIS — E6609 Other obesity due to excess calories: Secondary | ICD-10-CM | POA: Diagnosis not present

## 2014-11-01 DIAGNOSIS — E782 Mixed hyperlipidemia: Secondary | ICD-10-CM | POA: Diagnosis not present

## 2014-11-01 DIAGNOSIS — Z6835 Body mass index (BMI) 35.0-35.9, adult: Secondary | ICD-10-CM | POA: Diagnosis not present

## 2014-11-01 DIAGNOSIS — I10 Essential (primary) hypertension: Secondary | ICD-10-CM | POA: Diagnosis not present

## 2014-11-01 DIAGNOSIS — E119 Type 2 diabetes mellitus without complications: Secondary | ICD-10-CM | POA: Diagnosis not present

## 2014-11-02 DIAGNOSIS — M7541 Impingement syndrome of right shoulder: Secondary | ICD-10-CM | POA: Diagnosis not present

## 2015-02-27 ENCOUNTER — Other Ambulatory Visit: Payer: Self-pay

## 2015-06-20 DIAGNOSIS — I10 Essential (primary) hypertension: Secondary | ICD-10-CM | POA: Diagnosis not present

## 2015-06-20 DIAGNOSIS — E663 Overweight: Secondary | ICD-10-CM | POA: Diagnosis not present

## 2015-06-20 DIAGNOSIS — Z1389 Encounter for screening for other disorder: Secondary | ICD-10-CM | POA: Diagnosis not present

## 2015-06-20 DIAGNOSIS — Z6828 Body mass index (BMI) 28.0-28.9, adult: Secondary | ICD-10-CM | POA: Diagnosis not present

## 2015-06-20 DIAGNOSIS — E119 Type 2 diabetes mellitus without complications: Secondary | ICD-10-CM | POA: Diagnosis not present

## 2015-06-20 DIAGNOSIS — Z23 Encounter for immunization: Secondary | ICD-10-CM | POA: Diagnosis not present

## 2015-06-20 DIAGNOSIS — E782 Mixed hyperlipidemia: Secondary | ICD-10-CM | POA: Diagnosis not present

## 2015-06-28 ENCOUNTER — Other Ambulatory Visit (HOSPITAL_COMMUNITY): Payer: Self-pay | Admitting: Family Medicine

## 2015-06-28 DIAGNOSIS — R198 Other specified symptoms and signs involving the digestive system and abdomen: Secondary | ICD-10-CM

## 2015-07-04 ENCOUNTER — Ambulatory Visit (HOSPITAL_COMMUNITY)
Admission: RE | Admit: 2015-07-04 | Discharge: 2015-07-04 | Disposition: A | Discharge status: Home / Self Care | Payer: Medicare Other | Source: Ambulatory Visit | Attending: Family Medicine | Admitting: Family Medicine

## 2015-07-04 DIAGNOSIS — R198 Other specified symptoms and signs involving the digestive system and abdomen: Secondary | ICD-10-CM | POA: Diagnosis not present

## 2015-07-04 DIAGNOSIS — Z9049 Acquired absence of other specified parts of digestive tract: Secondary | ICD-10-CM | POA: Insufficient documentation

## 2015-07-04 DIAGNOSIS — N281 Cyst of kidney, acquired: Secondary | ICD-10-CM | POA: Insufficient documentation

## 2015-07-07 ENCOUNTER — Encounter: Payer: Self-pay | Admitting: Internal Medicine

## 2015-07-21 ENCOUNTER — Ambulatory Visit (INDEPENDENT_AMBULATORY_CARE_PROVIDER_SITE_OTHER): Payer: Medicare Other | Admitting: Gastroenterology

## 2015-07-21 ENCOUNTER — Encounter: Payer: Self-pay | Admitting: Gastroenterology

## 2015-07-21 VITALS — BP 136/74 | HR 88 | Temp 98.3°F | Ht 61.0 in | Wt 148.0 lb

## 2015-07-21 DIAGNOSIS — R112 Nausea with vomiting, unspecified: Secondary | ICD-10-CM | POA: Diagnosis not present

## 2015-07-21 DIAGNOSIS — R1013 Epigastric pain: Secondary | ICD-10-CM

## 2015-07-21 DIAGNOSIS — K449 Diaphragmatic hernia without obstruction or gangrene: Secondary | ICD-10-CM

## 2015-07-21 NOTE — Patient Instructions (Signed)
1. Discussed with Dr. Gala Romney, he agrees to barium study of your upper GI tract. 2. We will obtain a copy of your recent labs for review

## 2015-07-21 NOTE — Assessment & Plan Note (Signed)
69 year old female with isolated events of postprandial epigastric pain associated with vomiting incomplete relief of symptoms post vomiting. She has a history of moderate sized hiatal hernia plus or minus paraesophageal component, not clear on last EGD. Suspect symptoms are related to her hernia with intermittent obstruction plus or minus incarceration. Discussed with Dr. Gala Romney. Plan for esophagram/upper GI series to evaluate her anatomy. Further recommendations to follow.  Obtain copy of recent labs from PCP.  Next colonoscopy due in May 2019 because of her history of colon cancer, mother age 68. Brother age 67.

## 2015-07-21 NOTE — Progress Notes (Signed)
Primary Care Physician:  Purvis Kilts, MD  Primary Gastroenterologist:  Garfield Cornea, MD   Chief Complaint  Patient presents with  . Dysphagia    HPI:  Courtney Dorsey is a 69 y.o. female here for dysphagia. She was last seen in 2014. History of profound IDA. EGD and colonoscopy in 2014 with moderate size paraesophageal hernia, mild erosive gastritis, chronic duodenitis. History of Excedrin Migraine use, ibuprofen. Family history of colorectal cancer, mother age 100, brother age 73. Capsule endoscopy done as well which is essentially unremarkable. At time of capsule placement, no paraesophageal component of her hiatal hernia seen. Her IDA resolved. Hemogram 16.1 in October 2014.  Abdominal ultrasound done couple weeks ago showed simple cyst in the right kidney measuring 5.3 cm but otherwise unremarkable. Performed for epigastric pain. Feels like food sticking in the epigastric area when she eats. Isolated events. Associated with severe epigastric pain followed by vomiting and incomplete relief of her symptoms. In between episodes she has no problems swallowing. Episodes occurring every few weeks to months. Worst episodes have occurred when eating salad but it doesn't happen every time. She denies any heartburn. She has been able to come off of PPI therapy after 42 pound weight loss over the course of the past 9 months. Intentional weight loss from 190 pounds down 148 pounds with diet and exercise. Bowel movements are regular. No blood in the stool or melena.    Current Outpatient Prescriptions  Medication Sig Dispense Refill  . aspirin 81 MG tablet Take 81 mg by mouth daily.    Marland Kitchen atorvastatin (LIPITOR) 40 MG tablet Take 40 mg by mouth daily.    . ENALAPRIL MALEATE PO Take 10 mg by mouth 2 (two) times daily.     . metFORMIN (GLUCOPHAGE) 500 MG tablet Take 250 mg by mouth 2 (two) times daily with a meal.     No current facility-administered medications for this visit.    Allergies as of  07/21/2015  . (No Known Allergies)    Past Medical History  Diagnosis Date  . Diabetes mellitus without complication ([place])   . Anemia   . Heart murmur   . Hypertension   . Hypercholesteremia   . Diverticulosis     Past Surgical History  Procedure Laterality Date  . Cholecystectomy    . Colonoscopy  2009    Dr. Aviva Signs  . Colonoscopy N/A 01/01/2013    [id number] mucosa in the terminal ileum  Mild diverticulosis in the descending colon and sigmoid colon and sigmoid colon/Moderate sized internal hemorrhoids  . Esophagogastroduodenoscopy N/A 01/01/2013    [id number] hiatal hernia/ MODERATE SIZE PARA-ESOPHAGEAL HERNIA/MILD Erosive gastritis. chronic duodenitis c/w peptic duodenitis. no h.pylori or villous atrophy. minimal chronic gastritis.   . Esophagogastroduodenoscopy N/A 02/16/2013    Procedure: ESOPHAGOGASTRODUODENOSCOPY (EGD);  Surgeon: Daneil Dolin, MD;  Location: [facility];  Service: Endoscopy;  Laterality: N/A;  8:30  . Givens capsule study N/A 02/16/2013    EGD with capsule placement. no evidence of paraesophageal hernia. SB essentially unremarkable.    Family History  Problem Relation Age of Onset  . Colon cancer Mother     diagnosed around age 27  . Colon cancer Brother     age 8  . Liver disease Neg Hx   . Lung cancer Neg Hx   . Breast cancer Neg Hx   . Ovarian cancer Neg Hx   . Celiac disease Neg Hx     Social History   Social History  .  Marital Status: Married    Spouse Name: N/A  . Number of Children: 1  . Years of Education: N/A   Occupational History  .     Social History Main Topics  . Smoking status: Current Every Day Smoker  . Smokeless tobacco: Never Used     Comment: trying to quit, on the patch  . Alcohol Use: No  . Drug Use: No  . Sexual Activity: Yes    Birth Control/ Protection: Post-menopausal   Other Topics Concern  . Not on file   Social History Narrative      ROS:  General: Negative for anorexia, weight loss, fever,  chills, fatigue, weakness. Eyes: Negative for vision changes.  ENT: Negative for hoarseness, difficulty swallowing , nasal congestion. CV: Negative for chest pain, angina, palpitations, dyspnea on exertion, peripheral edema.  Respiratory: Negative for dyspnea at rest, dyspnea on exertion, cough, sputum, wheezing.  GI: See history of present illness. GU:  Negative for dysuria, hematuria, urinary incontinence, urinary frequency, nocturnal urination.  MS: Negative for joint pain, low back pain.  Derm: Negative for rash or itching.  Neuro: Negative for weakness, abnormal sensation, seizure, frequent headaches, memory loss, confusion.  Psych: Negative for anxiety, depression, suicidal ideation, hallucinations.  Endo: Negative for unusual weight change.  Heme: Negative for bruising or bleeding. Allergy: Negative for rash or hives.    Physical Examination:  BP 136/74 mmHg  Pulse 88  Temp(Src) 98.3 F (36.8 C) (Oral)  Ht 5\' 1"  (1.549 m)  Wt 148 lb (67.132 kg)  BMI 27.98 kg/m2   General: Well-nourished, well-developed in no acute distress.  Head: Normocephalic, atraumatic.   Eyes: Conjunctiva pink, no icterus. Mouth: Oropharyngeal mucosa moist and pink , no lesions erythema or exudate. Neck: Supple without thyromegaly, masses, or lymphadenopathy.  Lungs: Clear to auscultation bilaterally.  Heart: Regular rate and rhythm, no murmurs rubs or gallops.  Abdomen: Bowel sounds are normal, nontender, nondistended, no hepatosplenomegaly or masses, no abdominal bruits or    hernia , no rebound or guarding.   Rectal: Not performed Extremities: No lower extremity edema. No clubbing or deformities.  Neuro: Alert and oriented x 4 , grossly normal neurologically.  Skin: Warm and dry, no rash or jaundice.   Psych: Alert and cooperative, normal mood and affect.  Imaging Studies: US Abdomen Complete  07/04/2015  CLINICAL DATA:  Abdominal fullness, history of previous cholecystectomy, episodes of GI  bleeding, and diverticulosis. EXAM: ULTRASOUND ABDOMEN COMPLETE COMPARISON:  None in PACs FINDINGS: Gallbladder: The gallbladder is surgically absent. No abnormal findings are observed in the gallbladder fossa. Common bile duct: Diameter: 4 mm Liver: The hepatic echotexture is normal. There is no focal mass nor ductal dilation. IVC: No abnormality visualized. Pancreas: The pancreatic head and body and proximal tail were normal. There is obscuration of the distal aspect of the pancreatic tail by bowel gas. Spleen: Size and appearance within normal limits. Right Kidney: Length: 11.4 cm. Echogenicity within normal limits. There is a midpole cyst measuring 5.3 x 4.7 x 5.2 cm .No hydronephrosis visualized. Left Kidney: Length: 11.5 cm. Echogenicity within normal limits. No mass or hydronephrosis visualized. Abdominal aorta: Evaluation of the abdominal aorta was limited by bowel gas. No aneurysm is observed. Other findings: There is no ascites. IMPRESSION: 1. No acute abnormality of the liver, spleen, or visualized portions of the pancreas. The gallbladder is surgically absent. 2. Simple cyst in the midpole of the right kidney measuring 5.3 cm in greatest dimension. 3. No ascites is observed. 4.  If the patient's symptoms remain unexplained, abdominal and pelvic CT scanning may be the most useful next imaging step. Electronically Signed   By: David  Martinique M.D.   On: 07/04/2015 10:16

## 2015-07-24 NOTE — Progress Notes (Signed)
cc'ed to pcp °

## 2015-07-31 ENCOUNTER — Ambulatory Visit (HOSPITAL_COMMUNITY)
Admission: RE | Admit: 2015-07-31 | Discharge: 2015-07-31 | Disposition: A | Discharge status: Home / Self Care | Payer: Medicare Other | Source: Ambulatory Visit | Attending: Gastroenterology | Admitting: Gastroenterology

## 2015-07-31 DIAGNOSIS — R1013 Epigastric pain: Secondary | ICD-10-CM | POA: Diagnosis not present

## 2015-07-31 DIAGNOSIS — K225 Diverticulum of esophagus, acquired: Secondary | ICD-10-CM | POA: Insufficient documentation

## 2015-07-31 DIAGNOSIS — T17308A Unspecified foreign body in larynx causing other injury, initial encounter: Secondary | ICD-10-CM | POA: Diagnosis not present

## 2015-07-31 DIAGNOSIS — K449 Diaphragmatic hernia without obstruction or gangrene: Secondary | ICD-10-CM

## 2015-07-31 DIAGNOSIS — R112 Nausea with vomiting, unspecified: Secondary | ICD-10-CM

## 2015-08-03 DIAGNOSIS — H25813 Combined forms of age-related cataract, bilateral: Secondary | ICD-10-CM | POA: Diagnosis not present

## 2015-08-03 DIAGNOSIS — H17823 Peripheral opacity of cornea, bilateral: Secondary | ICD-10-CM | POA: Diagnosis not present

## 2015-08-03 DIAGNOSIS — E119 Type 2 diabetes mellitus without complications: Secondary | ICD-10-CM | POA: Diagnosis not present

## 2015-08-09 NOTE — Progress Notes (Signed)
Quick Note:  Reviewed labs dated 06/20/2015 Sodium 140, BUN 17, creatinine 0.73, total biliruben 0.3, alkaline phosphatase 96, AST 16, ALT 13, albumin 4.7.  Upper GI series showed large hiatal hernia with greater than 50% of the stomach in the lower chest, no evidence of ulceration, obstruction. Laryngeal penetration but no aspiration. Incomplete tiny Zenker diverticulum.  Suspect symptoms secondary to large hiatal hernia. Patient should consider surgical consultation for repair, recommend [place] surgery. Please let me know what she decides. ______

## 2015-08-17 ENCOUNTER — Encounter: Payer: Self-pay | Admitting: Internal Medicine

## 2015-08-31 DIAGNOSIS — K449 Diaphragmatic hernia without obstruction or gangrene: Secondary | ICD-10-CM | POA: Diagnosis not present

## 2015-09-06 DIAGNOSIS — Z1389 Encounter for screening for other disorder: Secondary | ICD-10-CM | POA: Diagnosis not present

## 2015-09-06 DIAGNOSIS — J209 Acute bronchitis, unspecified: Secondary | ICD-10-CM | POA: Diagnosis not present

## 2015-09-06 DIAGNOSIS — L03011 Cellulitis of right finger: Secondary | ICD-10-CM | POA: Diagnosis not present

## 2015-09-06 DIAGNOSIS — E663 Overweight: Secondary | ICD-10-CM | POA: Diagnosis not present

## 2015-09-06 DIAGNOSIS — Z6828 Body mass index (BMI) 28.0-28.9, adult: Secondary | ICD-10-CM | POA: Diagnosis not present

## 2015-10-01 ENCOUNTER — Emergency Department (HOSPITAL_COMMUNITY): Payer: Medicare Other

## 2015-10-01 ENCOUNTER — Encounter (HOSPITAL_COMMUNITY): Payer: Self-pay | Admitting: *Deleted

## 2015-10-01 ENCOUNTER — Emergency Department (HOSPITAL_COMMUNITY)
Admission: EM | Admit: 2015-10-01 | Discharge: 2015-10-02 | Disposition: A | Discharge status: Home / Self Care | Payer: Medicare Other | Attending: Emergency Medicine | Admitting: Emergency Medicine

## 2015-10-01 DIAGNOSIS — I10 Essential (primary) hypertension: Secondary | ICD-10-CM | POA: Insufficient documentation

## 2015-10-01 DIAGNOSIS — R1013 Epigastric pain: Secondary | ICD-10-CM | POA: Insufficient documentation

## 2015-10-01 DIAGNOSIS — Z8719 Personal history of other diseases of the digestive system: Secondary | ICD-10-CM | POA: Diagnosis not present

## 2015-10-01 DIAGNOSIS — N281 Cyst of kidney, acquired: Secondary | ICD-10-CM | POA: Diagnosis not present

## 2015-10-01 DIAGNOSIS — R112 Nausea with vomiting, unspecified: Secondary | ICD-10-CM | POA: Diagnosis not present

## 2015-10-01 DIAGNOSIS — Z79899 Other long term (current) drug therapy: Secondary | ICD-10-CM | POA: Insufficient documentation

## 2015-10-01 DIAGNOSIS — Z7982 Long term (current) use of aspirin: Secondary | ICD-10-CM | POA: Insufficient documentation

## 2015-10-01 DIAGNOSIS — Z9089 Acquired absence of other organs: Secondary | ICD-10-CM | POA: Diagnosis not present

## 2015-10-01 DIAGNOSIS — Z9889 Other specified postprocedural states: Secondary | ICD-10-CM | POA: Diagnosis not present

## 2015-10-01 DIAGNOSIS — R011 Cardiac murmur, unspecified: Secondary | ICD-10-CM | POA: Insufficient documentation

## 2015-10-01 DIAGNOSIS — E7800 Pure hypercholesterolemia, unspecified: Secondary | ICD-10-CM | POA: Insufficient documentation

## 2015-10-01 DIAGNOSIS — F17200 Nicotine dependence, unspecified, uncomplicated: Secondary | ICD-10-CM | POA: Insufficient documentation

## 2015-10-01 DIAGNOSIS — E119 Type 2 diabetes mellitus without complications: Secondary | ICD-10-CM | POA: Insufficient documentation

## 2015-10-01 DIAGNOSIS — K449 Diaphragmatic hernia without obstruction or gangrene: Secondary | ICD-10-CM | POA: Diagnosis not present

## 2015-10-01 DIAGNOSIS — Z862 Personal history of diseases of the blood and blood-forming organs and certain disorders involving the immune mechanism: Secondary | ICD-10-CM | POA: Diagnosis not present

## 2015-10-01 DIAGNOSIS — Z7984 Long term (current) use of oral hypoglycemic drugs: Secondary | ICD-10-CM | POA: Diagnosis not present

## 2015-10-01 LAB — CBC WITH DIFFERENTIAL/PLATELET
BASOS ABS: 0.1 10*3/uL (ref 0.0–0.1)
BASOS PCT: 0 %
EOS ABS: 0.1 10*3/uL (ref 0.0–0.7)
EOS PCT: 0 %
HCT: 46.4 % — ABNORMAL HIGH (ref 36.0–46.0)
Hemoglobin: 15.3 g/dL — ABNORMAL HIGH (ref 12.0–15.0)
Lymphocytes Relative: 9 %
Lymphs Abs: 1.1 10*3/uL (ref 0.7–4.0)
MCH: 28.4 pg (ref 26.0–34.0)
MCHC: 33 g/dL (ref 30.0–36.0)
MCV: 86.1 fL (ref 78.0–100.0)
MONO ABS: 0.7 10*3/uL (ref 0.1–1.0)
MONOS PCT: 6 %
Neutro Abs: 10.3 10*3/uL — ABNORMAL HIGH (ref 1.7–7.7)
Neutrophils Relative %: 85 %
PLATELETS: 238 10*3/uL (ref 150–400)
RBC: 5.39 MIL/uL — ABNORMAL HIGH (ref 3.87–5.11)
RDW: 16.1 % — AB (ref 11.5–15.5)
WBC: 12.2 10*3/uL — ABNORMAL HIGH (ref 4.0–10.5)

## 2015-10-01 LAB — URINALYSIS, ROUTINE W REFLEX MICROSCOPIC
GLUCOSE, UA: NEGATIVE mg/dL
KETONES UR: 40 mg/dL — AB
NITRITE: NEGATIVE
PROTEIN: 100 mg/dL — AB
Specific Gravity, Urine: 1.025 (ref 1.005–1.030)
pH: 7 (ref 5.0–8.0)

## 2015-10-01 LAB — URINE MICROSCOPIC-ADD ON

## 2015-10-01 LAB — COMPREHENSIVE METABOLIC PANEL
ALBUMIN: 4.4 g/dL (ref 3.5–5.0)
ALT: 18 U/L (ref 14–54)
ANION GAP: 12 (ref 5–15)
AST: 17 U/L (ref 15–41)
Alkaline Phosphatase: 74 U/L (ref 38–126)
BILIRUBIN TOTAL: 0.6 mg/dL (ref 0.3–1.2)
BUN: 14 mg/dL (ref 6–20)
CHLORIDE: 101 mmol/L (ref 101–111)
CO2: 26 mmol/L (ref 22–32)
Calcium: 9.3 mg/dL (ref 8.9–10.3)
Creatinine, Ser: 0.61 mg/dL (ref 0.44–1.00)
GFR calc Af Amer: >60 mL/min (ref >60)
GFR calc non Af Amer: >60 mL/min (ref >60)
GLUCOSE: 111 mg/dL — AB (ref 65–99)
POTASSIUM: 3.7 mmol/L (ref 3.5–5.1)
SODIUM: 139 mmol/L (ref 135–145)
TOTAL PROTEIN: 7.5 g/dL (ref 6.5–8.1)

## 2015-10-01 LAB — LIPASE, BLOOD: Lipase: 21 U/L (ref 11–51)

## 2015-10-01 MED ORDER — METOCLOPRAMIDE HCL 10 MG PO TABS: 10.0000 mg | ORAL_TABLET | Freq: Four times a day (QID) | ORAL | Status: DC

## 2015-10-01 MED ORDER — MORPHINE SULFATE (PF) 4 MG/ML IV SOLN
4.0000 mg | Freq: Once | INTRAVENOUS | Status: AC
  Administered 2015-10-01: 4 mg via INTRAVENOUS
  Filled 2015-10-01: qty 1

## 2015-10-01 MED ORDER — HYDROCODONE-ACETAMINOPHEN 5-325 MG PO TABS: 1.0000 | ORAL_TABLET | Freq: Four times a day (QID) | ORAL | Status: DC | PRN

## 2015-10-01 MED ORDER — CEPHALEXIN 500 MG PO CAPS: 500.0000 mg | ORAL_CAPSULE | Freq: Four times a day (QID) | ORAL | Status: DC

## 2015-10-01 MED ORDER — ONDANSETRON HCL 4 MG/2ML IJ SOLN
4.0000 mg | Freq: Once | INTRAMUSCULAR | Status: AC
  Administered 2015-10-01: 4 mg via INTRAVENOUS
  Filled 2015-10-01: qty 2

## 2015-10-01 MED ORDER — DEXTROSE 5 % IV SOLN
1.0000 g | Freq: Once | INTRAVENOUS | Status: AC
  Administered 2015-10-01: 1 g via INTRAVENOUS
  Filled 2015-10-01: qty 10

## 2015-10-01 MED ORDER — FENTANYL CITRATE (PF) 100 MCG/2ML IJ SOLN: 100.0000 ug | Freq: Once | INTRAMUSCULAR | Status: DC

## 2015-10-01 MED ORDER — ONDANSETRON 4 MG PO TBDP: ORAL_TABLET | ORAL | Status: DC

## 2015-10-01 MED ORDER — FENTANYL CITRATE (PF) 100 MCG/2ML IJ SOLN
100.0000 ug | Freq: Once | INTRAMUSCULAR | Status: AC
  Administered 2015-10-01: 100 ug via INTRAVENOUS
  Filled 2015-10-01: qty 2

## 2015-10-01 MED ORDER — IOHEXOL 300 MG/ML  SOLN
100.0000 mL | Freq: Once | INTRAMUSCULAR | Status: AC | PRN
  Administered 2015-10-01: 100 mL via INTRAVENOUS

## 2015-10-01 NOTE — ED Notes (Addendum)
Patient has known hiatal hernia is scheduled to have repair on 2/7 by Dr. Hassell Done.  Patient states today at @ 1300, she began epigastric pain after eating.  Patient then began having vomiting and she has vomited 5-6 times.  Patient states usually when she vomits, she feels better, but today that has not been the case.  Patient has not taken anything for the pain, but has used antacids with no relief.  Patient has no home meds for nausea.

## 2015-10-01 NOTE — ED Provider Notes (Signed)
CSN: [id number]     Arrival date & time 10/01/15  1819 History   First MD Initiated Contact with Patient 10/01/15 1853     Chief Complaint  Patient presents with  . Abdominal Pain  . Emesis     (Consider location/radiation/quality/duration/timing/severity/associated sxs/prior Treatment) Patient is a 70 y.o. female presenting with abdominal pain.  Abdominal Pain Pain location:  Epigastric Pain quality: aching and sharp   Pain radiates to:  Does not radiate Pain severity:  Mild Onset quality:  Gradual Duration:  6 hours Timing:  Constant Chronicity:  New Context: not alcohol use and not previous surgeries   Associated symptoms: nausea and vomiting   Associated symptoms: no cough, no dysuria, no fever and no shortness of breath     Past Medical History  Diagnosis Date  . Diabetes mellitus without complication ([place])   . Anemia   . Heart murmur   . Hypertension   . Hypercholesteremia   . Diverticulosis    Past Surgical History  Procedure Laterality Date  . Cholecystectomy    . Colonoscopy  2009    Dr. Aviva Signs  . Colonoscopy N/A 01/01/2013    [id number] mucosa in the terminal ileum  Mild diverticulosis in the descending colon and sigmoid colon and sigmoid colon/Moderate sized internal hemorrhoids  . Esophagogastroduodenoscopy N/A 01/01/2013    [id number] hiatal hernia/ MODERATE SIZE PARA-ESOPHAGEAL HERNIA/MILD Erosive gastritis. chronic duodenitis c/w peptic duodenitis. no h.pylori or villous atrophy. minimal chronic gastritis.   . Esophagogastroduodenoscopy N/A 02/16/2013    Procedure: ESOPHAGOGASTRODUODENOSCOPY (EGD);  Surgeon: Daneil Dolin, MD;  Location: [facility];  Service: Endoscopy;  Laterality: N/A;  8:30  . Givens capsule study N/A 02/16/2013    EGD with capsule placement. no evidence of paraesophageal hernia. SB essentially unremarkable.   Family History  Problem Relation Age of Onset  . Colon cancer Mother     diagnosed around age 23  . Colon cancer  Brother     age 69  . Liver disease Neg Hx   . Lung cancer Neg Hx   . Breast cancer Neg Hx   . Ovarian cancer Neg Hx   . Celiac disease Neg Hx    Social History  Substance Use Topics  . Smoking status: Current Every Day Smoker  . Smokeless tobacco: Never Used     Comment: trying to quit, on the patch  . Alcohol Use: No   OB History    No data available     Review of Systems  Constitutional: Negative for fever.  Eyes: Negative for pain.  Respiratory: Negative for cough and shortness of breath.   Gastrointestinal: Positive for nausea, vomiting and abdominal pain.  Endocrine: Negative for polydipsia and polyuria.  Genitourinary: Negative for dysuria, enuresis and difficulty urinating.  Musculoskeletal: Negative for back pain.  All other systems reviewed and are negative.     Allergies  Review of patient's allergies indicates no known allergies.  Home Medications   Prior to Admission medications   Medication Sig Start Date End Date Taking? Authorizing Provider  acetaminophen (TYLENOL) 500 MG tablet Take 1,000 mg by mouth every 6 (six) hours as needed for moderate pain.   Yes Historical Provider, MD  aspirin 81 MG tablet Take 81 mg by mouth every morning.    Yes Historical Provider, MD  atorvastatin (LIPITOR) 40 MG tablet Take 40 mg by mouth every evening.    Yes Historical Provider, MD  enalapril (VASOTEC) 10 MG tablet Take 10 mg by mouth 2 (  two) times daily.   Yes Historical Provider, MD  ketoconazole (NIZORAL) 2 % cream Apply 1 application topically daily as needed for irritation (behind ear.).   Yes Historical Provider, MD  metFORMIN (GLUCOPHAGE) 500 MG tablet Take 250 mg by mouth 2 (two) times daily with a meal.   Yes Historical Provider, MD  omeprazole (PRILOSEC) 40 MG capsule Take 40 mg by mouth every evening.   Yes Historical Provider, MD  ranitidine (ZANTAC) 150 MG tablet Take 150-300 mg by mouth 2 (two) times daily as needed for heartburn.   Yes Historical Provider,  MD  Simethicone (GAS-X PO) Take 1-2 tablets by mouth 2 (two) times daily as needed (stomach irritation).   Yes Historical Provider, MD  cephALEXin (KEFLEX) 500 MG capsule Take 1 capsule (500 mg total) by mouth 4 (four) times daily. 10/01/15   Merrily Pew, MD  HYDROcodone-acetaminophen (NORCO) 5-325 MG tablet Take 1 tablet by mouth every 6 (six) hours as needed. 10/01/15   Merrily Pew, MD  metoCLOPramide (REGLAN) 10 MG tablet Take 1 tablet (10 mg total) by mouth every 6 (six) hours. 10/01/15   Merrily Pew, MD  ondansetron (ZOFRAN ODT) 4 MG disintegrating tablet 4mg  ODT q4 hours prn nausea/vomit 10/01/15   Merrily Pew, MD  sulfamethoxazole-trimethoprim (BACTRIM DS,SEPTRA DS) 800-160 MG tablet Take 1 tablet by mouth 2 (two) times daily. Reported on 10/01/2015 09/06/15   Historical Provider, MD   BP 137/77 mmHg  Pulse 88  Temp(Src) 97.8 F (36.6 C) (Oral)  Resp 18  SpO2 100% Physical Exam  Constitutional: She is oriented to person, place, and time. She appears well-developed and well-nourished.  HENT:  Head: Normocephalic and atraumatic.  Neck: Normal range of motion.  Cardiovascular: Normal rate and regular rhythm.   Pulmonary/Chest: No stridor. No respiratory distress.  Abdominal: She exhibits no distension. There is tenderness (slight epigastric).  Musculoskeletal: She exhibits no edema or tenderness.  Neurological: She is alert and oriented to person, place, and time. No cranial nerve deficit.  Skin: Skin is warm and dry.  Nursing note and vitals reviewed.   ED Course  Procedures (including critical care time) Labs Review Labs Reviewed  CBC WITH DIFFERENTIAL/PLATELET - Abnormal; Notable for the following:    WBC 12.2 (*)    RBC 5.39 (*)    Hemoglobin 15.3 (*)    HCT 46.4 (*)    RDW 16.1 (*)    Neutro Abs 10.3 (*)    All other components within normal limits  COMPREHENSIVE METABOLIC PANEL - Abnormal; Notable for the following:    Glucose, Bld 111 (*)    All other components  within normal limits  URINALYSIS, ROUTINE W REFLEX MICROSCOPIC (NOT AT [facility]) - Abnormal; Notable for the following:    Hgb urine dipstick SMALL (*)    Bilirubin Urine SMALL (*)    Ketones, ur 40 (*)    Protein, ur 100 (*)    Leukocytes, UA SMALL (*)    All other components within normal limits  URINE MICROSCOPIC-ADD ON - Abnormal; Notable for the following:    Squamous Epithelial / LPF 6-30 (*)    Bacteria, UA MANY (*)    All other components within normal limits  LIPASE, BLOOD    Imaging Review Ct Chest W Contrast  10/01/2015  CLINICAL DATA:  Sudden onset of upper abdominal pain with emesis today. Known hiatal hernia. Evaluate for hiatal hernia strangulation or obstruction. EXAM: CT CHEST, ABDOMEN, AND PELVIS WITH CONTRAST TECHNIQUE: Multidetector CT imaging of the chest, abdomen  and pelvis was performed following the standard protocol during bolus administration of intravenous contrast. CONTRAST:  153mL OMNIPAQUE IOHEXOL 300 MG/ML  SOLN COMPARISON:  Upper GI 07/31/2015 FINDINGS: CT CHEST Large paraesophageal hiatal hernia with approximately 50% of the stomach being intrathoracic. The stomach is fluid-filled without gastric wall thickening or surrounding inflammatory change. The esophagus decompressed. No findings to suggest gastric obstruction or strangulation. Heart is normal in size. Thoracic aorta is normal in caliber. Mild atherosclerosis of the aorta and its branches. No mediastinal or hilar adenopathy. No pericardial effusion. Compressive atelectasis in the left lower lobe secondary to hiatal hernia. The lungs are otherwise clear. There is no consolidation. No suspicious pulmonary nodule. No pleural effusion. No pulmonary edema. There are no acute or suspicious osseous abnormalities. CT ABDOMEN AND PELVIS There are no dilated or thickened bowel loops. Moderate stool burden without colonic wall thickening. Distal colonic diverticulosis without diverticulitis. The appendix is normal. Clips in  the gallbladder fossa postcholecystectomy. No biliary dilatation. No focal hepatic lesion. Spleen is normal in size with tiny subcentimeter hypodensity in the upper portion. There is a 2.9 x 2.4 cm heterogeneous left adrenal nodule. Right adrenal gland appears normal. There is no pancreatic ductal dilatation or inflammation. There is a 5.7 cm right renal cyst. Smaller hypodensity in the upper right kidney, too small to characterize. No hydronephrosis or perinephric stranding. No retroperitoneal adenopathy. Abdominal aorta is normal in caliber. Moderate atherosclerosis without aneurysm. Within the pelvis the bladder is physiologically distended. Endometrium appears prominent for age measuring 1.4 cm. Endometrial hypodensity may reflect fluid in the endometrial canal. Ovaries not discretely identified. No pelvic free fluid. No pelvic adenopathy. There are no acute or suspicious osseous abnormalities. IMPRESSION: 1. Large hiatal/paraesophageal hernia. No evidence of obstruction or strangulation. The stomach is fluid-filled, however no esophageal dilatation, gastric wall thickening, or surrounding inflammation. 2. Incidental note of abnormal appearance of the endometrium, measuring up to 1.4 cm in thickness. It is unclear whether this represents endometrial thickening or fluid in the endometrial canal, however is abnormal for a patient of this age. Nonemergent characterization with pelvic ultrasound could be considered, however endometrial biopsy may ultimately be needed. 3. 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 Further evaluation is needed. If there is history of malignancy or endometrial workup reveals malignancy, findings are concerning for metastasis. In the absence of history malignancy, further evaluation with contrast-enhanced adrenal protocol MRI is recommended. 4. Incidental findings of right renal cyst and mild distal colonic diverticulosis. 5. No prior abdominal CT or MRI are  available. Comparison with any prior imaging may be helpful if performed elsewhere. Electronically Signed   By: Jeb Levering M.D.   On: 10/01/2015 21:54   Ct Abdomen Pelvis W Contrast  10/01/2015  CLINICAL DATA:  Sudden onset of upper abdominal pain with emesis today. Known hiatal hernia. Evaluate for hiatal hernia strangulation or obstruction. EXAM: CT CHEST, ABDOMEN, AND PELVIS WITH CONTRAST TECHNIQUE: Multidetector CT imaging of the chest, abdomen and pelvis was performed following the standard protocol during bolus administration of intravenous contrast. CONTRAST:  163mL OMNIPAQUE IOHEXOL 300 MG/ML  SOLN COMPARISON:  Upper GI 07/31/2015 FINDINGS: CT CHEST Large paraesophageal hiatal hernia with approximately 50% of the stomach being intrathoracic. The stomach is fluid-filled without gastric wall thickening or surrounding inflammatory change. The esophagus decompressed. No findings to suggest gastric obstruction or strangulation. Heart is normal in size. Thoracic aorta is normal in caliber. Mild atherosclerosis of the aorta and its branches. No mediastinal or hilar  adenopathy. No pericardial effusion. Compressive atelectasis in the left lower lobe secondary to hiatal hernia. The lungs are otherwise clear. There is no consolidation. No suspicious pulmonary nodule. No pleural effusion. No pulmonary edema. There are no acute or suspicious osseous abnormalities. CT ABDOMEN AND PELVIS There are no dilated or thickened bowel loops. Moderate stool burden without colonic wall thickening. Distal colonic diverticulosis without diverticulitis. The appendix is normal. Clips in the gallbladder fossa postcholecystectomy. No biliary dilatation. No focal hepatic lesion. Spleen is normal in size with tiny subcentimeter hypodensity in the upper portion. There is a 2.9 x 2.4 cm heterogeneous left adrenal nodule. Right adrenal gland appears normal. There is no pancreatic ductal dilatation or inflammation. There is a 5.7 cm  right renal cyst. Smaller hypodensity in the upper right kidney, too small to characterize. No hydronephrosis or perinephric stranding. No retroperitoneal adenopathy. Abdominal aorta is normal in caliber. Moderate atherosclerosis without aneurysm. Within the pelvis the bladder is physiologically distended. Endometrium appears prominent for age measuring 1.4 cm. Endometrial hypodensity may reflect fluid in the endometrial canal. Ovaries not discretely identified. No pelvic free fluid. No pelvic adenopathy. There are no acute or suspicious osseous abnormalities. IMPRESSION: 1. Large hiatal/paraesophageal hernia. No evidence of obstruction or strangulation. The stomach is fluid-filled, however no esophageal dilatation, gastric wall thickening, or surrounding inflammation. 2. Incidental note of abnormal appearance of the endometrium, measuring up to 1.4 cm in thickness. It is unclear whether this represents endometrial thickening or fluid in the endometrial canal, however is abnormal for a patient of this age. Nonemergent characterization with pelvic ultrasound could be considered, however endometrial biopsy may ultimately be needed. 3. 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 Further evaluation is needed. If there is history of malignancy or endometrial workup reveals malignancy, findings are concerning for metastasis. In the absence of history malignancy, further evaluation with contrast-enhanced adrenal protocol MRI is recommended. 4. Incidental findings of right renal cyst and mild distal colonic diverticulosis. 5. No prior abdominal CT or MRI are available. Comparison with any prior imaging may be helpful if performed elsewhere. Electronically Signed   By: Jeb Levering M.D.   On: 10/01/2015 21:54   I have personally reviewed and evaluated these images and lab results as part of my medical decision-making.   EKG Interpretation None      MDM   Final diagnoses:  Non-intractable  vomiting with nausea, vomiting of unspecified type    70 yo F w/ large hiatal hernia here with vomiting and pain similar to previous episodes of the same. Exam unremarkable. Symptoms improved. Ct withotu strangulation/obstruction. Also has likely UTI. Will treat for same, will follow up with GI/surgery.   Merrily Pew, MD 10/02/15 [phone]

## 2015-10-02 NOTE — Patient Instructions (Addendum)
YOUR PROCEDURE IS SCHEDULED ON : 10/10/15  REPORT TO [facility] MAIN ENTRANCE FOLLOW SIGNS TO EAST ELEVATOR - GO TO 3rd FLOOR CHECK IN AT 3 EAST NURSES STATION (SHORT STAY) AT: 8:00 AM  CALL THIS NUMBER IF YOU HAVE PROBLEMS THE MORNING OF SURGERY [phone]  REMEMBER:ONLY 1 PER PERSON MAY GO TO SHORT STAY WITH YOU TO GET READY THE MORNING OF YOUR SURGERY  DO NOT EAT FOOD OR DRINK LIQUIDS AFTER MIDNIGHT  TAKE THESE MEDICINES THE MORNING OF SURGERY: OMEPRAZOLE  STOP ASPIRIN / IBUPROFEN / ALEVE / VITAMINS / HERBAL MEDS __5__ DAYS BEFORE SURGERY  FLEET ENEMA THE NIGHT BEFORE SURGERY  YOU MAY NOT HAVE ANY METAL ON YOUR BODY INCLUDING HAIR PINS AND PIERCING'S. DO NOT WEAR JEWELRY, MAKEUP, LOTIONS, POWDERS OR PERFUMES. DO NOT WEAR NAIL POLISH. DO NOT SHAVE 48 HRS PRIOR TO SURGERY. MEN MAY SHAVE FACE AND NECK.  DO NOT [place]. [facility] IS NOT RESPONSIBLE FOR VALUABLES.  CONTACTS, DENTURES OR PARTIALS MAY NOT BE WORN TO SURGERY. LEAVE SUITCASE IN CAR. CAN BE BROUGHT TO ROOM AFTER SURGERY.  PATIENTS DISCHARGED THE DAY OF SURGERY WILL NOT BE ALLOWED TO DRIVE HOME.  PLEASE READ OVER THE FOLLOWING INSTRUCTION SHEETS _________________________________________________________________________________                                           - PREPARING FOR SURGERY  Before surgery, you can play an important role.  Because skin is not sterile, your skin needs to be as free of germs as possible.  You can reduce the number of germs on your skin by washing with CHG (chlorahexidine gluconate) soap before surgery.  CHG is an antiseptic cleaner which kills germs and bonds with the skin to continue killing germs even after washing. Please DO NOT use if you have an allergy to CHG or antibacterial soaps.  If your skin becomes reddened/irritated stop using the CHG and inform your nurse when you arrive at Short Stay. Do not shave (including legs and  underarms) for at least 48 hours prior to the first CHG shower.  You may shave your face. Please follow these instructions carefully:   1.  Shower with CHG Soap the night before surgery and the  morning of Surgery.   2.  If you choose to wash your hair, wash your hair first as usual with your  normal  Shampoo.   3.  After you shampoo, rinse your hair and body thoroughly to remove the  shampoo.                                         4.  Use CHG as you would any other liquid soap.  You can apply chg directly  to the skin and wash . Gently wash with scrungie or clean wascloth    5.  Apply the CHG Soap to your body ONLY FROM THE NECK DOWN.   Do not use on open                           Wound or open sores. Avoid contact with eyes, ears mouth and genitals (private parts).  Genitals (private parts) with your normal soap.              6.  Wash thoroughly, paying special attention to the area where your surgery  will be performed.   7.  Thoroughly rinse your body with warm water from the neck down.   8.  DO NOT shower/wash with your normal soap after using and rinsing off  the CHG Soap .                9.  Pat yourself dry with a clean towel.             10.  Wear clean night clothes to bed after shower             11.  Place clean sheets on your bed the night of your first shower and do not  sleep with pets.  Day of Surgery : Do not apply any lotions/deodorants the morning of surgery.  Please wear clean clothes to the hospital/surgery center.  FAILURE TO FOLLOW THESE INSTRUCTIONS MAY RESULT IN THE CANCELLATION OF YOUR SURGERY    PATIENT SIGNATURE_________________________________  ______________________________________________________________________

## 2015-10-04 ENCOUNTER — Ambulatory Visit: Payer: Self-pay | Admitting: Surgery

## 2015-10-05 ENCOUNTER — Encounter (HOSPITAL_COMMUNITY)
Admission: RE | Admit: 2015-10-05 | Discharge: 2015-10-05 | Disposition: A | Discharge status: Home / Self Care | Payer: Medicare Other | Source: Ambulatory Visit | Attending: Surgery | Admitting: Surgery

## 2015-10-05 ENCOUNTER — Encounter (HOSPITAL_COMMUNITY): Payer: Self-pay

## 2015-10-05 DIAGNOSIS — E119 Type 2 diabetes mellitus without complications: Secondary | ICD-10-CM | POA: Diagnosis not present

## 2015-10-05 DIAGNOSIS — Z01812 Encounter for preprocedural laboratory examination: Secondary | ICD-10-CM | POA: Diagnosis not present

## 2015-10-05 DIAGNOSIS — Z01818 Encounter for other preprocedural examination: Secondary | ICD-10-CM | POA: Insufficient documentation

## 2015-10-05 DIAGNOSIS — K449 Diaphragmatic hernia without obstruction or gangrene: Secondary | ICD-10-CM | POA: Diagnosis not present

## 2015-10-05 DIAGNOSIS — R9431 Abnormal electrocardiogram [ECG] [EKG]: Secondary | ICD-10-CM | POA: Insufficient documentation

## 2015-10-05 HISTORY — DX: Personal history of other medical treatment: Z92.89

## 2015-10-05 HISTORY — DX: Personal history of other malignant neoplasm of skin: Z85.828

## 2015-10-05 HISTORY — DX: Personal history of other diseases of the digestive system: Z87.19

## 2015-10-05 HISTORY — DX: Urinary tract infection, site not specified: N39.0

## 2015-10-05 LAB — CBC
HEMATOCRIT: 45.9 % (ref 36.0–46.0)
Hemoglobin: 15.1 g/dL — ABNORMAL HIGH (ref 12.0–15.0)
MCH: 28.4 pg (ref 26.0–34.0)
MCHC: 32.9 g/dL (ref 30.0–36.0)
MCV: 86.3 fL (ref 78.0–100.0)
Platelets: 245 10*3/uL (ref 150–400)
RBC: 5.32 MIL/uL — ABNORMAL HIGH (ref 3.87–5.11)
RDW: 16.1 % — AB (ref 11.5–15.5)
WBC: 9.9 10*3/uL (ref 4.0–10.5)

## 2015-10-06 LAB — HEMOGLOBIN A1C
HEMOGLOBIN A1C: 6.2 % — AB (ref 4.8–5.6)
MEAN PLASMA GLUCOSE: 131 mg/dL

## 2015-10-09 NOTE — H&P (Signed)
Chief Complaint:  Large hiatal hernia  History of Present Illness:  Courtney Dorsey is an 70 y.o. female who presented to the [place] office in late December with a history of anemia to 5 and a large hiatal hernia (>50% of stomach in chest).  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  She has decided that she wants to move forward with surgical correction and is aware of the benefits and limitations of the surgery including failure.    Past Medical History  Diagnosis Date  . Anemia   . Heart murmur   . Hypertension   . Hypercholesteremia   . Diverticulosis   . History of skin cancer   . History of hiatal hernia   . UTI (lower urinary tract infection)     TAKING ANTIBIOTICS  . History of transfusion   . Diabetes mellitus without complication ([place])     "BORDERLINE"    Past Surgical History  Procedure Laterality Date  . Cholecystectomy    . Colonoscopy  2009    Dr. Aviva Signs  . Colonoscopy N/A 01/01/2013    [id number] mucosa in the terminal ileum  Mild diverticulosis in the descending colon and sigmoid colon and sigmoid colon/Moderate sized internal hemorrhoids  . Esophagogastroduodenoscopy N/A 01/01/2013    [id number] hiatal hernia/ MODERATE SIZE PARA-ESOPHAGEAL HERNIA/MILD Erosive gastritis. chronic duodenitis c/w peptic duodenitis. no h.pylori or villous atrophy. minimal chronic gastritis.   . Esophagogastroduodenoscopy N/A 02/16/2013    Procedure: ESOPHAGOGASTRODUODENOSCOPY (EGD);  Surgeon: Daneil Dolin, MD;  Location: [facility];  Service: Endoscopy;  Laterality: N/A;  8:30  . [place]ens capsule study N/A 02/16/2013    EGD with capsule placement. no evidence of paraesophageal hernia. SB essentially unremarkable.  . Knot removed from scalp      No current facility-administered medications for this encounter.   Current Outpatient Prescriptions  Medication Sig Dispense Refill  . acetaminophen (TYLENOL) 500 MG tablet Take 1,000 mg by mouth every 6  (six) hours as needed for moderate pain.    Marland Kitchen aspirin 81 MG tablet Take 81 mg by mouth every morning.     Marland Kitchen atorvastatin (LIPITOR) 40 MG tablet Take 40 mg by mouth every evening.     . enalapril (VASOTEC) 10 MG tablet Take 10 mg by mouth 2 (two) times daily.    Marland Kitchen ketoconazole (NIZORAL) 2 % cream Apply 1 application topically daily as needed for irritation (behind ear.).    Marland Kitchen metFORMIN (GLUCOPHAGE) 500 MG tablet Take 250 mg by mouth 2 (two) times daily with a meal.    . omeprazole (PRILOSEC) 40 MG capsule Take 40 mg by mouth every evening.    . cephALEXin (KEFLEX) 500 MG capsule Take 1 capsule (500 mg total) by mouth 4 (four) times daily. 20 capsule 0  . HYDROcodone-acetaminophen (NORCO) 5-325 MG tablet Take 1 tablet by mouth every 6 (six) hours as needed. 10 tablet 0  . metoCLOPramide (REGLAN) 10 MG tablet Take 1 tablet (10 mg total) by mouth every 6 (six) hours. 30 tablet 0  . ondansetron (ZOFRAN ODT) 4 MG disintegrating tablet 4mg  ODT q4 hours prn nausea/vomit 10 tablet 0  . ranitidine (ZANTAC) 150 MG tablet Take 150-300 mg by mouth 2 (two) times daily as needed for heartburn.    . Simethicone (GAS-X PO) Take 1-2 tablets by mouth 2 (two) times daily as needed (stomach irritation).    Marland Kitchen sulfamethoxazole-trimethoprim (BACTRIM DS,SEPTRA DS) 800-160 MG tablet Take 1 tablet by mouth 2 (two)  times daily. Reported on 10/01/2015     Review of patient's allergies indicates no known allergies. Family History  Problem Relation Age of Onset  . Colon cancer Mother     diagnosed around age 8  . Colon cancer Brother     age 21  . Liver disease Neg Hx   . Lung cancer Neg Hx   . Breast cancer Neg Hx   . Ovarian cancer Neg Hx   . Celiac disease Neg Hx    Social History:   reports that she has been smoking.  She has never used smokeless tobacco. She reports that she does not drink alcohol or use illicit drugs.   REVIEW OF SYSTEMS : Negative except for COPD  Physical Exam:   There were no vitals  taken for this visit. There is no weight on file to calculate BMI.  Gen:  WDWN WF NAD  Neurological: Alert and oriented to person, place, and time. Motor and sensory function is grossly intact  Head: Normocephalic and atraumatic.  Eyes: Conjunctivae are normal. Pupils are equal, round, and reactive to light. No scleral icterus.  Neck: Normal range of motion. Neck supple. No tracheal deviation or thyromegaly present.  Cardiovascular:  SR without murmurs or gallops.  No carotid bruits Breast:  Not examined Respiratory: Effort normal.  No respiratory distress. No chest wall tenderness. Breath sounds normal.  No wheezes, rales or rhonchi.  Abdomen:  Lap chole scars GU:  Not examinned Musculoskeletal: Normal range of motion. Extremities are nontender. No cyanosis, edema or clubbing noted Lymphadenopathy: No cervical, preauricular, postauricular or axillary adenopathy is present Skin: Skin is warm and dry. No rash noted. No diaphoresis. No erythema. No pallor. Pscyh: Normal mood and affect. Behavior is normal. Judgment and thought content normal.   LABORATORY RESULTS: No results found for this or any previous visit (from the past 48 hour(s)).   RADIOLOGY RESULTS: No results found.  Problem List: Patient Active Problem List   Diagnosis Date Noted  . Hiatal hernia 07/21/2015  . Abdominal pain, epigastric 07/21/2015  . Nausea with vomiting 07/21/2015  . IDA (iron deficiency anemia) 01/29/2013  . Microcytic anemia 12/31/2012  . Diabetes mellitus type 2, controlled ([place]) 12/31/2012  . GI bleed 12/31/2012    Assessment & Plan: Large symptomatic hiatal hernia Plan laparoscopic reduction and repair with probable Nissen fundoplication    Matt B. Hassell Done, MD, [facility] [phone] beeper [phone]  10/09/2015 10:23 PM

## 2015-10-10 ENCOUNTER — Encounter (HOSPITAL_COMMUNITY)
Admission: RE | Disposition: A | Discharge status: Home / Self Care | Payer: Self-pay | Source: Ambulatory Visit | Attending: Surgery

## 2015-10-10 ENCOUNTER — Encounter (HOSPITAL_COMMUNITY): Payer: Self-pay | Admitting: *Deleted

## 2015-10-10 ENCOUNTER — Inpatient Hospital Stay (HOSPITAL_COMMUNITY)
Admission: RE | Admit: 2015-10-10 | Discharge: 2015-10-12 | DRG: 328 | Disposition: A | Discharge status: Home / Self Care | Payer: Medicare Other | Source: Ambulatory Visit | Attending: Surgery | Admitting: Surgery

## 2015-10-10 ENCOUNTER — Inpatient Hospital Stay (HOSPITAL_COMMUNITY): Payer: Medicare Other | Admitting: Registered Nurse

## 2015-10-10 DIAGNOSIS — Z01812 Encounter for preprocedural laboratory examination: Secondary | ICD-10-CM

## 2015-10-10 DIAGNOSIS — K449 Diaphragmatic hernia without obstruction or gangrene: Principal | ICD-10-CM | POA: Diagnosis present

## 2015-10-10 DIAGNOSIS — K219 Gastro-esophageal reflux disease without esophagitis: Secondary | ICD-10-CM | POA: Diagnosis present

## 2015-10-10 DIAGNOSIS — Z9889 Other specified postprocedural states: Secondary | ICD-10-CM

## 2015-10-10 DIAGNOSIS — I10 Essential (primary) hypertension: Secondary | ICD-10-CM | POA: Diagnosis present

## 2015-10-10 DIAGNOSIS — E119 Type 2 diabetes mellitus without complications: Secondary | ICD-10-CM | POA: Diagnosis not present

## 2015-10-10 DIAGNOSIS — Z9884 Bariatric surgery status: Secondary | ICD-10-CM | POA: Diagnosis not present

## 2015-10-10 DIAGNOSIS — E7800 Pure hypercholesterolemia, unspecified: Secondary | ICD-10-CM | POA: Diagnosis present

## 2015-10-10 DIAGNOSIS — F17200 Nicotine dependence, unspecified, uncomplicated: Secondary | ICD-10-CM | POA: Diagnosis present

## 2015-10-10 DIAGNOSIS — Z7982 Long term (current) use of aspirin: Secondary | ICD-10-CM | POA: Diagnosis not present

## 2015-10-10 DIAGNOSIS — Z79899 Other long term (current) drug therapy: Secondary | ICD-10-CM

## 2015-10-10 DIAGNOSIS — K922 Gastrointestinal hemorrhage, unspecified: Secondary | ICD-10-CM | POA: Diagnosis not present

## 2015-10-10 HISTORY — PX: LAPAROSCOPIC NISSEN FUNDOPLICATION: SHX1932

## 2015-10-10 LAB — CBC
HCT: 43.3 % (ref 36.0–46.0)
Hemoglobin: 14 g/dL (ref 12.0–15.0)
MCH: 27.7 pg (ref 26.0–34.0)
MCHC: 32.3 g/dL (ref 30.0–36.0)
MCV: 85.6 fL (ref 78.0–100.0)
PLATELETS: 214 10*3/uL (ref 150–400)
RBC: 5.06 MIL/uL (ref 3.87–5.11)
RDW: 16.2 % — ABNORMAL HIGH (ref 11.5–15.5)
WBC: 19 10*3/uL — AB (ref 4.0–10.5)

## 2015-10-10 LAB — CREATININE, SERUM: Creatinine, Ser: 0.77 mg/dL (ref 0.44–1.00)

## 2015-10-10 LAB — GLUCOSE, CAPILLARY: Glucose-Capillary: 112 mg/dL — ABNORMAL HIGH (ref 65–99)

## 2015-10-10 SURGERY — FUNDOPLICATION, NISSEN, LAPAROSCOPIC
Anesthesia: General

## 2015-10-10 MED ORDER — DEXAMETHASONE SODIUM PHOSPHATE 10 MG/ML IJ SOLN
INTRAMUSCULAR | Status: AC
  Filled 2015-10-10: qty 1

## 2015-10-10 MED ORDER — CHLORHEXIDINE GLUCONATE 4 % EX LIQD: 1.0000 "application " | Freq: Once | CUTANEOUS | Status: DC

## 2015-10-10 MED ORDER — HYDRALAZINE HCL 20 MG/ML IJ SOLN
10.0000 mg | INTRAMUSCULAR | Status: DC | PRN
Start: 2015-10-10 — End: 2015-10-12

## 2015-10-10 MED ORDER — DIPHENHYDRAMINE HCL 12.5 MG/5ML PO ELIX: 12.5000 mg | ORAL_SOLUTION | Freq: Four times a day (QID) | ORAL | Status: DC | PRN

## 2015-10-10 MED ORDER — DIPHENHYDRAMINE HCL 50 MG/ML IJ SOLN: 12.5000 mg | Freq: Four times a day (QID) | INTRAMUSCULAR | Status: DC | PRN

## 2015-10-10 MED ORDER — FENTANYL CITRATE (PF) 250 MCG/5ML IJ SOLN
INTRAMUSCULAR | Status: AC
  Filled 2015-10-10: qty 5

## 2015-10-10 MED ORDER — KCL IN DEXTROSE-NACL 20-5-0.45 MEQ/L-%-% IV SOLN
INTRAVENOUS | Status: DC
  Administered 2015-10-10: 14:00:00 via INTRAVENOUS
  Administered 2015-10-11: 1000 mL via INTRAVENOUS
  Administered 2015-10-11: 09:00:00 via INTRAVENOUS
  Administered 2015-10-12: 1000 mL via INTRAVENOUS
  Filled 2015-10-10 (×6): qty 1000

## 2015-10-10 MED ORDER — HEPARIN SODIUM (PORCINE) 5000 UNIT/ML IJ SOLN
5000.0000 [IU] | Freq: Once | INTRAMUSCULAR | Status: AC
  Administered 2015-10-10: 5000 [IU] via SUBCUTANEOUS
  Filled 2015-10-10: qty 1

## 2015-10-10 MED ORDER — ESMOLOL HCL 100 MG/10ML IV SOLN
INTRAVENOUS | Status: AC
  Filled 2015-10-10: qty 10

## 2015-10-10 MED ORDER — EPHEDRINE SULFATE 50 MG/ML IJ SOLN
INTRAMUSCULAR | Status: DC | PRN
  Administered 2015-10-10: 5 mg via INTRAVENOUS

## 2015-10-10 MED ORDER — ONDANSETRON HCL 4 MG/2ML IJ SOLN
INTRAMUSCULAR | Status: DC | PRN
  Administered 2015-10-10: 4 mg via INTRAVENOUS

## 2015-10-10 MED ORDER — PROPOFOL 10 MG/ML IV BOLUS
INTRAVENOUS | Status: DC | PRN
  Administered 2015-10-10: 160 mg via INTRAVENOUS

## 2015-10-10 MED ORDER — LIDOCAINE HCL (CARDIAC) 20 MG/ML IV SOLN
INTRAVENOUS | Status: DC | PRN
  Administered 2015-10-10: 100 mg via INTRAVENOUS

## 2015-10-10 MED ORDER — CEFAZOLIN SODIUM-DEXTROSE 2-3 GM-% IV SOLR
INTRAVENOUS | Status: AC
  Filled 2015-10-10: qty 50

## 2015-10-10 MED ORDER — SODIUM CHLORIDE 0.9 % IJ SOLN
INTRAMUSCULAR | Status: DC | PRN
  Administered 2015-10-10: 10 mL via INTRAVENOUS

## 2015-10-10 MED ORDER — HEPARIN SODIUM (PORCINE) 5000 UNIT/ML IJ SOLN
5000.0000 [IU] | Freq: Three times a day (TID) | INTRAMUSCULAR | Status: DC
  Administered 2015-10-10 – 2015-10-12 (×5): 5000 [IU] via SUBCUTANEOUS
  Filled 2015-10-10 (×8): qty 1

## 2015-10-10 MED ORDER — SUGAMMADEX SODIUM 200 MG/2ML IV SOLN
INTRAVENOUS | Status: AC
  Filled 2015-10-10: qty 2

## 2015-10-10 MED ORDER — LACTATED RINGERS IV SOLN
INTRAVENOUS | Status: DC | PRN
  Administered 2015-10-10: 1000 mL via INTRAVENOUS

## 2015-10-10 MED ORDER — ROCURONIUM BROMIDE 100 MG/10ML IV SOLN
INTRAVENOUS | Status: DC | PRN
  Administered 2015-10-10: 20 mg via INTRAVENOUS
  Administered 2015-10-10: 40 mg via INTRAVENOUS

## 2015-10-10 MED ORDER — SUCCINYLCHOLINE CHLORIDE 20 MG/ML IJ SOLN
INTRAMUSCULAR | Status: DC | PRN
  Administered 2015-10-10: 100 mg via INTRAVENOUS

## 2015-10-10 MED ORDER — FENTANYL CITRATE (PF) 100 MCG/2ML IJ SOLN
INTRAMUSCULAR | Status: DC | PRN
  Administered 2015-10-10 (×6): 50 ug via INTRAVENOUS
  Administered 2015-10-10: 100 ug via INTRAVENOUS
  Administered 2015-10-10: 50 ug via INTRAVENOUS

## 2015-10-10 MED ORDER — ONDANSETRON HCL 4 MG/2ML IJ SOLN: 4.0000 mg | Freq: Once | INTRAMUSCULAR | Status: DC | PRN

## 2015-10-10 MED ORDER — ONDANSETRON HCL 4 MG/2ML IJ SOLN
INTRAMUSCULAR | Status: AC
  Filled 2015-10-10: qty 2

## 2015-10-10 MED ORDER — DEXAMETHASONE SODIUM PHOSPHATE 10 MG/ML IJ SOLN
INTRAMUSCULAR | Status: DC | PRN
  Administered 2015-10-10: 10 mg via INTRAVENOUS

## 2015-10-10 MED ORDER — BUPIVACAINE LIPOSOME 1.3 % IJ SUSP
20.0000 mL | Freq: Once | INTRAMUSCULAR | Status: AC
  Administered 2015-10-10: 20 mL
  Filled 2015-10-10: qty 20

## 2015-10-10 MED ORDER — ROCURONIUM BROMIDE 100 MG/10ML IV SOLN
INTRAVENOUS | Status: AC
  Filled 2015-10-10: qty 1

## 2015-10-10 MED ORDER — MIDAZOLAM HCL 2 MG/2ML IJ SOLN
INTRAMUSCULAR | Status: AC
  Filled 2015-10-10: qty 2

## 2015-10-10 MED ORDER — MIDAZOLAM HCL 5 MG/5ML IJ SOLN
INTRAMUSCULAR | Status: DC | PRN
  Administered 2015-10-10: 2 mg via INTRAVENOUS

## 2015-10-10 MED ORDER — PANTOPRAZOLE SODIUM 40 MG IV SOLR
40.0000 mg | Freq: Every day | INTRAVENOUS | Status: DC
  Administered 2015-10-10 – 2015-10-11 (×2): 40 mg via INTRAVENOUS
  Filled 2015-10-10 (×3): qty 40

## 2015-10-10 MED ORDER — PROMETHAZINE HCL 25 MG/ML IJ SOLN
INTRAMUSCULAR | Status: AC
  Filled 2015-10-10: qty 1

## 2015-10-10 MED ORDER — LIDOCAINE HCL (CARDIAC) 20 MG/ML IV SOLN
INTRAVENOUS | Status: AC
  Filled 2015-10-10: qty 5

## 2015-10-10 MED ORDER — ONDANSETRON HCL 4 MG/2ML IJ SOLN
4.0000 mg | Freq: Four times a day (QID) | INTRAMUSCULAR | Status: DC | PRN
Start: 2015-10-10 — End: 2015-10-12

## 2015-10-10 MED ORDER — SODIUM CHLORIDE 0.9 % IJ SOLN
INTRAMUSCULAR | Status: AC
  Filled 2015-10-10: qty 10

## 2015-10-10 MED ORDER — FLEET ENEMA 7-19 GM/118ML RE ENEM: 1.0000 | ENEMA | Freq: Once | RECTAL | Status: DC

## 2015-10-10 MED ORDER — ESMOLOL HCL 100 MG/10ML IV SOLN
INTRAVENOUS | Status: DC | PRN
  Administered 2015-10-10 (×2): 20 mg via INTRAVENOUS

## 2015-10-10 MED ORDER — SUGAMMADEX SODIUM 200 MG/2ML IV SOLN
INTRAVENOUS | Status: DC | PRN
  Administered 2015-10-10: 130 mg via INTRAVENOUS

## 2015-10-10 MED ORDER — CEFAZOLIN SODIUM-DEXTROSE 2-3 GM-% IV SOLR
2.0000 g | INTRAVENOUS | Status: AC
  Administered 2015-10-10: 2 g via INTRAVENOUS

## 2015-10-10 MED ORDER — HYDROMORPHONE HCL 1 MG/ML IJ SOLN: 0.5000 mg | INTRAMUSCULAR | Status: DC | PRN

## 2015-10-10 MED ORDER — PROPOFOL 10 MG/ML IV BOLUS
INTRAVENOUS | Status: AC
  Filled 2015-10-10: qty 20

## 2015-10-10 MED ORDER — LACTATED RINGERS IV SOLN
INTRAVENOUS | Status: DC
  Administered 2015-10-10: 1000 mL via INTRAVENOUS

## 2015-10-10 MED ORDER — PROMETHAZINE HCL 25 MG/ML IJ SOLN
12.5000 mg | Freq: Four times a day (QID) | INTRAMUSCULAR | Status: DC | PRN
  Administered 2015-10-10: 12.5 mg via INTRAVENOUS

## 2015-10-10 MED ORDER — HYDROMORPHONE HCL 1 MG/ML IJ SOLN
0.5000 mg | INTRAMUSCULAR | Status: DC | PRN
  Administered 2015-10-10 – 2015-10-12 (×10): 0.5 mg via INTRAVENOUS
  Filled 2015-10-10 (×10): qty 1

## 2015-10-10 MED ORDER — KCL IN DEXTROSE-NACL 20-5-0.45 MEQ/L-%-% IV SOLN
INTRAVENOUS | Status: AC
  Filled 2015-10-10: qty 1000

## 2015-10-10 MED ORDER — ONDANSETRON 4 MG PO TBDP: 4.0000 mg | ORAL_TABLET | Freq: Four times a day (QID) | ORAL | Status: DC | PRN

## 2015-10-10 SURGICAL SUPPLY — 42 items
APPLIER CLIP ROT 10 11.4 M/L (STAPLE)
APR CLP MED LRG 11.4X10 (STAPLE)
CABLE HIGH FREQUENCY MONO STRZ (ELECTRODE) ×2 IMPLANT
CLAMP ENDO BABCK 10MM (STAPLE) IMPLANT
CLIP APPLIE ROT 10 11.4 M/L (STAPLE) IMPLANT
COVER SURGICAL LIGHT HANDLE (MISCELLANEOUS) ×2 IMPLANT
DECANTER SPIKE VIAL GLASS SM (MISCELLANEOUS) ×2 IMPLANT
DEVICE SUT QUICK LOAD TK 5 (STAPLE) ×12 IMPLANT
DEVICE SUT TI-KNOT TK 5X26 (MISCELLANEOUS) ×2 IMPLANT
DEVICE SUTURE ENDOST 10MM (ENDOMECHANICALS) ×2 IMPLANT
DISSECTOR BLUNT TIP ENDO 5MM (MISCELLANEOUS) ×2 IMPLANT
DRAIN PENROSE 18X1/2 LTX STRL (DRAIN) ×2 IMPLANT
DRAPE LAPAROSCOPIC ABDOMINAL (DRAPES) ×2 IMPLANT
ELECT REM PT RETURN 9FT ADLT (ELECTROSURGICAL) ×2
ELECTRODE REM PT RTRN 9FT ADLT (ELECTROSURGICAL) ×1 IMPLANT
GLOVE BIOGEL M 8.0 STRL (GLOVE) ×2 IMPLANT
GOWN STRL REUS W/TWL XL LVL3 (GOWN DISPOSABLE) ×6 IMPLANT
GRASPER ENDO BABCOCK 10 (MISCELLANEOUS) IMPLANT
GRASPER ENDO BABCOCK 10MM (MISCELLANEOUS)
KIT BASIN OR (CUSTOM PROCEDURE TRAY) ×2 IMPLANT
LIQUID BAND (GAUZE/BANDAGES/DRESSINGS) ×1 IMPLANT
SCISSORS LAP 5X45 EPIX DISP (ENDOMECHANICALS) ×2 IMPLANT
SET IRRIG TUBING LAPAROSCOPIC (IRRIGATION / IRRIGATOR) ×2 IMPLANT
SHEARS HARMONIC ACE PLUS 45CM (MISCELLANEOUS) ×2 IMPLANT
SLEEVE ADV FIXATION 12X100MM (TROCAR) ×2 IMPLANT
SLEEVE ADV FIXATION 5X100MM (TROCAR) ×4 IMPLANT
STAPLER VISISTAT 35W (STAPLE) ×2 IMPLANT
SUT SURGIDAC NAB ES-9 0 48 120 (SUTURE) ×14 IMPLANT
SUT VIC AB 4-0 SH 18 (SUTURE) ×2 IMPLANT
TIP INNERVISION DETACH 40FR (MISCELLANEOUS) IMPLANT
TIP INNERVISION DETACH 50FR (MISCELLANEOUS) IMPLANT
TIP INNERVISION DETACH 56FR (MISCELLANEOUS) IMPLANT
TIPS INNERVISION DETACH 40FR (MISCELLANEOUS)
TOWEL OR 17X26 10 PK STRL BLUE (TOWEL DISPOSABLE) ×4 IMPLANT
TOWEL OR NON WOVEN STRL DISP B (DISPOSABLE) IMPLANT
TRAY FOLEY W/METER SILVER 14FR (SET/KITS/TRAYS/PACK) ×1 IMPLANT
TRAY FOLEY W/METER SILVER 16FR (SET/KITS/TRAYS/PACK) IMPLANT
TRAY LAPAROSCOPIC (CUSTOM PROCEDURE TRAY) ×2 IMPLANT
TROCAR ADV FIXATION 12X100MM (TROCAR) ×2 IMPLANT
TROCAR ADV FIXATION 5X100MM (TROCAR) ×2 IMPLANT
TROCAR BLADELESS OPT 5 100 (ENDOMECHANICALS) ×2 IMPLANT
TUBING FILTER THERMOFLATOR (ELECTROSURGICAL) ×2 IMPLANT

## 2015-10-10 NOTE — Anesthesia Procedure Notes (Signed)
Procedure Name: Intubation Date/Time: 10/10/2015 10:57 AM Performed by: Carleene Cooper A Pre-anesthesia Checklist: Patient identified, Timeout performed, Emergency Drugs available, Suction available and Patient being monitored Patient Re-evaluated:Patient Re-evaluated prior to inductionOxygen Delivery Method: Circle system utilized Preoxygenation: Pre-oxygenation with 100% oxygen Intubation Type: IV induction Ventilation: Mask ventilation without difficulty Laryngoscope Size: Mac and 4 Grade View: Grade I Tube type: Oral Tube size: 7.5 mm Number of attempts: 1 Airway Equipment and Method: Stylet Placement Confirmation: ETT inserted through vocal cords under direct vision,  breath sounds checked- equal and bilateral and positive ETCO2 Secured at: 21 cm Tube secured with: Tape Dental Injury: Teeth and Oropharynx as per pre-operative assessment

## 2015-10-10 NOTE — Anesthesia Preprocedure Evaluation (Addendum)
Anesthesia Evaluation  Patient identified by MRN, date of birth, ID band Patient awake    Reviewed: Allergy & Precautions, NPO status , Patient's Chart, lab work & pertinent test results  Airway Mallampati: II  TM Distance: >3 FB     Dental   Pulmonary Current Smoker,    Pulmonary exam normal        Cardiovascular hypertension, Normal cardiovascular exam     Neuro/Psych  Neuromuscular disease    GI/Hepatic hiatal hernia,   Endo/Other  diabetes, Type 2, Oral Hypoglycemic Agents  Renal/GU      Musculoskeletal   Abdominal   Peds  Hematology   Anesthesia Other Findings Hx GI bleed  Reproductive/Obstetrics                           Anesthesia Physical Anesthesia Plan  ASA: II  Anesthesia Plan: General   Post-op Pain Management:    Induction: Intravenous  Airway Management Planned: Oral ETT  Additional Equipment:   Intra-op Plan:   Post-operative Plan: Extubation in OR  Informed Consent: I have reviewed the patients History and Physical, chart, labs and discussed the procedure including the risks, benefits and alternatives for the proposed anesthesia with the patient or authorized representative who has indicated his/her understanding and acceptance.     Plan Discussed with: CRNA, Anesthesiologist and Surgeon  Anesthesia Plan Comments:         Anesthesia Quick Evaluation

## 2015-10-10 NOTE — Anesthesia Postprocedure Evaluation (Signed)
Anesthesia Post Note  Patient: Courtney Dorsey  Procedure(s) Performed: Procedure(s) (LRB): LAPAROSCOPIC NISSEN FUNDOPLICATION AND HIATAL HERNIA REPAIR (N/A)  Patient location during evaluation: PACU Anesthesia Type: General Level of consciousness: awake and alert Pain management: pain level controlled Vital Signs Assessment: post-procedure vital signs reviewed and stable Respiratory status: spontaneous breathing, nonlabored ventilation, respiratory function stable and patient connected to nasal cannula oxygen Cardiovascular status: blood pressure returned to baseline and stable Postop Assessment: no signs of nausea or vomiting Anesthetic complications: no    Last Vitals:  Filed Vitals:   10/10/15 1415 10/10/15 1430  BP: 139/69 154/80  Pulse: 81 96  Temp: 36.6 C   Resp: 14 20    Last Pain:  Filed Vitals:   10/10/15 1438  PainSc: 0-No pain                 Boyce Keltner A

## 2015-10-10 NOTE — Progress Notes (Signed)
Subq air resolved. Periorbital edema continues lt greater than rt.

## 2015-10-10 NOTE — Progress Notes (Signed)
Subq air noted T4 to collarbones and to deltoid regions bilaterally. Resp easy and unlabored. Color pink. Periorbital edema noted. Will monitor.

## 2015-10-10 NOTE — Transfer of Care (Signed)
Immediate Anesthesia Transfer of Care Note  Patient: Courtney Dorsey  Procedure(s) Performed: Procedure(s): LAPAROSCOPIC NISSEN FUNDOPLICATION AND HIATAL HERNIA REPAIR (N/A)  Patient Location: PACU  Anesthesia Type:General  Level of Consciousness: awake, alert , oriented and patient cooperative  Airway & Oxygen Therapy: Patient Spontanous Breathing and Patient connected to face mask oxygen  Post-op Assessment: Report given to RN, Post -op Vital signs reviewed and stable and Patient moving all extremities  Post vital signs: Reviewed and stable  Last Vitals:  Filed Vitals:   10/10/15 0753 10/10/15 1325  BP: 135/68 180/91  Pulse: 94   Temp: 36.5 C 36.7 C  Resp: 16 11    Complications: No apparent anesthesia complications

## 2015-10-10 NOTE — Interval H&P Note (Signed)
History and Physical Interval Note:  10/10/2015 9:53 AM  Courtney Dorsey  has presented today for surgery, with the diagnosis of HIATAL HERNIA, GERD  The various methods of treatment have been discussed with the patient and family. After consideration of risks, benefits and other options for treatment, the patient has consented to  Procedure(s): [place] (N/A) as a surgical intervention .  The patient's history has been reviewed, patient examined, no change in status, stable for surgery.  I have reviewed the patient's chart and labs.  Questions were answered to the patient's satisfaction.     Bowie Doiron B

## 2015-10-10 NOTE — Op Note (Signed)
Surgeon: Kaylyn Lim, MD, FACS  Asst:  Adonis Housekeeper, M.D. FACS  Anes:  Gen.  Procedure: Laparoscopic takedown of large type III mixed hiatal hernia with over half of the stomach in the chest, 6 suture posterior closure of the hiatus with 3 suture Nissen fundoplication  Diagnosis: Large type III mixed hiatal hernia  Complications: None noted  EBL:   20 cc  Drains: None  Description of Procedure:  The patient was taken to OR 1 at [facility].  After anesthesia was administered and the patient was prepped a timeout was performed.  The operation began through the left upper quadrant with a 5 mm Optiview approach. The patient was noted to have very lax fascia so as very careful in going beneath the left costal margin into the abdomen. The abdomen was insufflated without difficulty. Standard 5 mm trocar placements were performed and a 10-11 was used  to the right of midline. The Nathanson retractor was used in the upper midline to retract the left lateral segment. Large defect was noted to contain most of the stomach. I began dissection on the right crus incise that along the crus and began dissecting the sac out of the chest. We worked our way across the midline. I did the same on the left crus. I then went down to gastric short gastric vessels mobilized this segment of stomach and got that portion of the stomach mobilized. I continued dissecting up in the mediastinum takedown some stringy adhesions to free the esophagus remove any tension on it.  We did note that she had subcutaneous crepitus in her neck and face with the mediastinal dissection.   When this maneuver had been completed we were able to then began closure of the diaphragm posteriorly. I did this by placing  6 sutures  with the Endo Stitch using 0 Surgidek secured these with tie knots. This seemed to close the diaphragm nicely. Next I passed the Prestige grasper  around behind the esophagus and esophagogastric junction grasped a  portion  of the cardia that was free and created the invagination of the Nissen fundoplication. He was brought around and lay in place and did not appear to be under significant tension. A 56 lighted bougie was passed into the stomach down to the light demarcation.  Around this the invagination was completed using 3 sutures that upper most one placed with a stitch through stomach esophagus stomach secured with a timeout. Second and third sutures placed more inferiorly were placed and then tied in the intracorporeally. Ports eyes roll injected with ex Georges Lynch. There appeared to be in order. The good wrap was healthy looking. Bougie was withdrawn and everything looked to be in order. We injected the port sites with Exparel diluted to 30 cc. The incisions (6) were closed with 4-0 vicryl and Liquiban.   The patient tolerated the procedure well and was taken to the PACU in stable condition.     Matt B. Hassell Done, [place], [facility], [place]

## 2015-10-11 ENCOUNTER — Inpatient Hospital Stay (HOSPITAL_COMMUNITY): Payer: Medicare Other

## 2015-10-11 LAB — CBC
HEMATOCRIT: 43.7 % (ref 36.0–46.0)
Hemoglobin: 14.1 g/dL (ref 12.0–15.0)
MCH: 28.4 pg (ref 26.0–34.0)
MCHC: 32.3 g/dL (ref 30.0–36.0)
MCV: 88.1 fL (ref 78.0–100.0)
PLATELETS: 252 10*3/uL (ref 150–400)
RBC: 4.96 MIL/uL (ref 3.87–5.11)
RDW: 16.4 % — AB (ref 11.5–15.5)
WBC: 14.3 10*3/uL — AB (ref 4.0–10.5)

## 2015-10-11 LAB — BASIC METABOLIC PANEL
ANION GAP: 7 (ref 5–15)
BUN: 14 mg/dL (ref 6–20)
CALCIUM: 8.9 mg/dL (ref 8.9–10.3)
CO2: 26 mmol/L (ref 22–32)
CREATININE: 0.68 mg/dL (ref 0.44–1.00)
Chloride: 107 mmol/L (ref 101–111)
Glucose, Bld: 154 mg/dL — ABNORMAL HIGH (ref 65–99)
Potassium: 4.8 mmol/L (ref 3.5–5.1)
SODIUM: 140 mmol/L (ref 135–145)

## 2015-10-11 NOTE — Progress Notes (Signed)
Patient ID: Courtney Dorsey, female   DOB: 02-05-1946, 70 y.o.   MRN: [id number] [facility] Progress Note:   1 Day Post-Op  Subjective: Mental status is clear.  No complaints.  She got scrambled eggs for breakfast Objective: Vital signs in last 24 hours: Temp:  [97.9 F (36.6 C)-99.1 F (37.3 C)] 99.1 F (37.3 C) (02/08 1355) Pulse Rate:  [51-98] 52 (02/08 1355) Resp:  [13-20] 16 (02/08 1355) BP: (120-154)/(61-80) 148/66 mmHg (02/08 1355) SpO2:  [96 %-99 %] 98 % (02/08 1355)  Intake/Output from previous day: 02/07 0701 - 02/08 0700 In: 3610 [P.O.:60; I.V.:3550] Out: 595 [Urine:570; Blood:25] Intake/Output this shift: Total I/O In: -  Out: 650 [Urine:650]  Physical Exam: Work of breathing is normal.  Incisions ok  Lab Results:  Results for orders placed or performed during the hospital encounter of 10/10/15 (from the past 48 hour(s))  Glucose, capillary     Status: Abnormal   Collection Time: 10/10/15  7:52 AM  Result Value Ref Range   Glucose-Capillary 112 (H) 65 - 99 mg/dL  CBC     Status: Abnormal   Collection Time: 10/10/15  4:55 PM  Result Value Ref Range   WBC 19.0 (H) 4.0 - 10.5 K/uL   RBC 5.06 3.87 - 5.11 MIL/uL   Hemoglobin 14.0 12.0 - 15.0 g/dL   HCT 43.3 36.0 - 46.0 %   MCV 85.6 78.0 - 100.0 fL   MCH 27.7 26.0 - 34.0 pg   MCHC 32.3 30.0 - 36.0 g/dL   RDW 16.2 (H) 11.5 - 15.5 %   Platelets 214 150 - 400 K/uL  Creatinine, serum     Status: None   Collection Time: 10/10/15  4:55 PM  Result Value Ref Range   Creatinine, Ser 0.77 0.44 - 1.00 mg/dL   GFR calc non Af Amer >60 >60 mL/min   GFR calc Af Amer >60 >60 mL/min    Comment: (NOTE) The eGFR has been calculated using the CKD EPI equation. This calculation has not been validated in all clinical situations. eGFR's persistently <60 mL/min signify possible Chronic Kidney Disease.   CBC     Status: Abnormal   Collection Time: 10/11/15  5:09 AM  Result Value Ref Range   WBC 14.3 (H) 4.0 - 10.5  K/uL   RBC 4.96 3.87 - 5.11 MIL/uL   Hemoglobin 14.1 12.0 - 15.0 g/dL   HCT 43.7 36.0 - 46.0 %   MCV 88.1 78.0 - 100.0 fL   MCH 28.4 26.0 - 34.0 pg   MCHC 32.3 30.0 - 36.0 g/dL   RDW 16.4 (H) 11.5 - 15.5 %   Platelets 252 150 - 400 K/uL  Basic metabolic panel     Status: Abnormal   Collection Time: 10/11/15  5:09 AM  Result Value Ref Range   Sodium 140 135 - 145 mmol/L   Potassium 4.8 3.5 - 5.1 mmol/L   Chloride 107 101 - 111 mmol/L   CO2 26 22 - 32 mmol/L   Glucose, Bld 154 (H) 65 - 99 mg/dL   BUN 14 6 - 20 mg/dL   Creatinine, Ser 0.68 0.44 - 1.00 mg/dL   Calcium 8.9 8.9 - 10.3 mg/dL   GFR calc non Af Amer >60 >60 mL/min   GFR calc Af Amer >60 >60 mL/min    Comment: (NOTE) The eGFR has been calculated using the CKD EPI equation. This calculation has not been validated in all clinical situations. eGFR's persistently <60 mL/min signify possible  Chronic Kidney Disease.    Anion gap 7 5 - 15    Radiology/Results: Dg Ugi W/water Sol Cm  10/11/2015  ADDENDUM REPORT: 10/11/2015 11:59 ADDENDUM: Per discussion with Dr. Hassell Done, he is in agreement that the outpouching likely represents opacified wrap rather than a contained leak. This examination will serve as baseline for future comparison. Electronically Signed   By: Lorin Picket M.D.   On: 10/11/2015 11:59  10/11/2015  CLINICAL DATA:  Postop laparoscopic Nissen fundoplication. EXAM: WATER SOLUBLE UPPER GI SERIES TECHNIQUE: Single-column upper GI series was performed using water soluble contrast. CONTRAST:  Omnipaque 300. COMPARISON:  CT abdomen pelvis 10/01/2015. FLUOROSCOPY TIME:  Radiation Exposure Index (as provided by the fluoroscopic device): 18.1 mGy If the device does not provide the exposure index: Fluoroscopy Time (in minutes and seconds):  0 minutes 54 seconds. Number of Acquired Images:  3 FINDINGS: Scout view of the abdomen shows postoperative changes in the epigastric region. Bowel gas pattern is otherwise unremarkable.  Patient drank water soluble contrast. Postoperative changes of Nissen fundoplication. An opacified outpouching in the region of the wrap is seen on multiple images. No ill-defined leak. IMPRESSION: Outpouching along the Nissen fundoplication may represent opacification of the wrap or a portion of stomach excluded from the fundoplication. A contained leak cannot be definitively excluded. No ill-defined leak. Please correlate clinically and consider additional follow-up imaging, as clinically indicated. Electronically Signed: By: Lorin Picket M.D. On: 10/11/2015 11:19    Anti-infectives: Anti-infectives    Start     Dose/Rate Route Frequency Ordered Stop   10/10/15 0816  ceFAZolin (ANCEF) IVPB 2 g/50 mL premix     2 g 100 mL/hr over 30 Minutes Intravenous On call to O.R. 10/10/15 0816 10/10/15 1058      Assessment/Plan: Problem List: Patient Active Problem List   Diagnosis Date Noted  . Status post laparoscopic Nissen fundoplication and repair of large type III hiatal hernia Feb 2017 10/10/2015  . IDA (iron deficiency anemia) 01/29/2013  . Microcytic anemia 12/31/2012  . Diabetes mellitus type 2, controlled ([place]) 12/31/2012    UGI reviewed.  Start full liquids.   1 Day Post-Op    LOS: 1 day   Matt B. Hassell Done, MD, [facility] [phone] beeper [phone]  10/11/2015 2:07 PM

## 2015-10-12 LAB — CBC WITH DIFFERENTIAL/PLATELET
Basophils Absolute: 0 10*3/uL (ref 0.0–0.1)
Basophils Relative: 0 %
EOS ABS: 0.1 10*3/uL (ref 0.0–0.7)
Eosinophils Relative: 1 %
HEMATOCRIT: 42.9 % (ref 36.0–46.0)
HEMOGLOBIN: 13.5 g/dL (ref 12.0–15.0)
LYMPHS ABS: 1.6 10*3/uL (ref 0.7–4.0)
LYMPHS PCT: 14 %
MCH: 27.7 pg (ref 26.0–34.0)
MCHC: 31.5 g/dL (ref 30.0–36.0)
MCV: 87.9 fL (ref 78.0–100.0)
MONOS PCT: 12 %
Monocytes Absolute: 1.5 10*3/uL — ABNORMAL HIGH (ref 0.1–1.0)
NEUTROS ABS: 8.9 10*3/uL — AB (ref 1.7–7.7)
NEUTROS PCT: 73 %
Platelets: 191 10*3/uL (ref 150–400)
RBC: 4.88 MIL/uL (ref 3.87–5.11)
RDW: 16.2 % — ABNORMAL HIGH (ref 11.5–15.5)
WBC: 12.1 10*3/uL — AB (ref 4.0–10.5)

## 2015-10-12 MED ORDER — HYDROCODONE-HOMATROPINE 5-1.5 MG/5ML PO SYRP: 5.0000 mL | ORAL_SOLUTION | ORAL | Status: DC | PRN

## 2015-10-12 MED ORDER — ONDANSETRON 4 MG PO TBDP
4.0000 mg | ORAL_TABLET | Freq: Once | ORAL | Status: AC
  Administered 2015-10-12: 4 mg via ORAL
  Filled 2015-10-12: qty 1

## 2015-10-12 MED ORDER — ONDANSETRON 4 MG PO TBDP
4.0000 mg | ORAL_TABLET | Freq: Once | ORAL | Status: DC
Start: 2015-10-12 — End: 2016-10-28

## 2015-10-12 MED ORDER — HYDROCODONE-HOMATROPINE 5-1.5 MG/5ML PO SYRP
5.0000 mL | ORAL_SOLUTION | ORAL | Status: DC | PRN
  Administered 2015-10-12: 5 mL via ORAL
  Filled 2015-10-12: qty 5

## 2015-10-12 NOTE — Discharge Instructions (Signed)
Laparoscopic Nissen Fundoplication Laparoscopic Nissen fundoplication is surgery to relieve heartburn and other problems caused by gastric fluids flowing up into your esophagus. The esophagus is the tube that carries food and liquid from your throat to your stomach. Normally, the muscle that sits between your stomach and your esophagus (lower esophageal sphincter or LES) keeps stomach fluids in your stomach. In some people, the LES does not work properly, and stomach fluids flow up into the esophagus. This can happen when part of the stomach bulges through the LES (hiatal hernia). The backward flow of stomach fluids can cause a type of severe and long-standing heartburn that is called gastroesophageal reflux disease (GERD). You may need this surgery if other treatments for GERD have not helped. In a laparoscopic Nissen fundoplication, the upper part of your stomach is wrapped around the lower part of your esophagus to strengthen the LES and prevent reflux. If you have a hiatal hernia, it will also be repaired with this surgery. The procedure is done through several small incisions in your abdomen. It is performed using a thin, telescopic instrument (laparoscope) and other instruments that can pass through the scope or through other small incisions. LET [facility] CARE PROVIDER KNOW ABOUT:  Any allergies you have.  All medicines you are taking, including vitamins, herbs, eye drops, creams, and over-the-counter medicines.  Previous problems you or members of your family have had with the use of anesthetics.  Any blood disorders you have.  Previous surgeries you have had.  Medical conditions you have. RISKS AND COMPLICATIONS Generally, this is a safe procedure. However, problems may occur, including:  Difficulty swallowing (dysphagia).  Bloating.  Nausea or vomiting.  Damage to the lung, causing a collapsed lung.  Infection or bleeding. BEFORE THE PROCEDURE  Ask your health care provider  about:  Changing or stopping your regular medicines. This is especially important if you are taking diabetes medicines or blood thinners.  Taking medicines such as aspirin and ibuprofen. These medicines can thin your blood. Do not take these medicines before your procedure if your health care provider asks you not to.  Follow your health care provider's instructions about eating or drinking restrictions.  Plan to have someone take you home after the procedure. PROCEDURE  An IV tube will be inserted into one of your veins. It will be used to give you fluids and medicines during the procedure.  You will be given a medicine that makes you fall asleep (general anesthetic).  Your abdomen will be cleaned with a germ-killing solution (antiseptic).  The surgeon will make a small incision in your abdomen and insert a tube through the incision.  Your abdomen will be filled with a gas. This helps the surgeon to see your organs more easily and it makes more space to work.  The surgeon will insert the laparoscope through the incision. The scope has a camera that will send pictures to a monitor in the operating room.  The surgeon will make several other small incisions in your abdomen to insert the other instruments that are needed during the procedure.  Another instrument (dilator) will be passed through your mouth and down your esophagus into the upper part of your stomach. The dilator will prevent your LES from being closed too tightly during surgery.  The surgeon will pass the top portion of your stomach behind the lower part of your esophagus and wrap it all the way around. This will be stitched into place.  If you have a hiatal hernia,  it will be repaired during this procedure.  All instruments will be removed, and your incisions will be closed under your skin with stitches (sutures). Skin adhesive strips may also be used.  A bandage (dressing) will be placed on your skin over the  incisions. The procedure may vary among health care providers and hospitals. AFTER THE PROCEDURE  You will be moved to a recovery area.  Your blood pressure, heart rate, breathing rate, and blood oxygen level will be monitored often until the medicines you were given have worn off.  You will be given pain medicine as needed.  Your IV tube will be kept in until you are able to drink fluids.   This information is not intended to replace advice given to you by your health care provider. Make sure you discuss any questions you have with your health care provider.   Document Released: 09/09/2014 Document Reviewed: 09/09/2014 Elsevier Interactive Patient Education [employer].

## 2015-10-12 NOTE — Discharge Summary (Signed)
Physician Discharge Summary  Patient ID: Courtney Dorsey MRN: [id number] DOB/AGE: 03-02-1946 70 y.o.  Admit date: 10/10/2015 Discharge date: 10/12/2015  Admission Diagnoses:  Large type III hiatal hernia  Discharge Diagnoses:  same  Principal Problem:   Status post laparoscopic Nissen fundoplication and repair of large type III hiatal hernia Feb 2017   Surgery:  Lap Nissen fundoplication and repair of hiatal hernia  Discharged Condition: improved  Hospital Course:   Had surgery. UGI on PD 1 showed wrap intact with no leak.  Diet advanced to full liquid  Consults: none  Significant Diagnostic Studies: ugi    Discharge Exam: Blood pressure 159/69, pulse 66, temperature 98.2 F (36.8 C), temperature source Oral, resp. rate 16, height 5\' 1"  (1.549 m), weight 65.772 kg (145 lb), SpO2 94 %. Incisions ok  Disposition: 01-Home or Self Care  Discharge Instructions    Discharge instructions    Complete by:  As directed   Full liquids for 1 week then pureed diet for 4 weeks     Discharge wound care:    Complete by:  As directed   May shower ad lib     Increase activity slowly    Complete by:  As directed             Medication List    STOP taking these medications        HYDROcodone-acetaminophen 5-325 MG tablet  Commonly known as:  NORCO      TAKE these medications        acetaminophen 500 MG tablet  Commonly known as:  TYLENOL  Take 1,000 mg by mouth every 6 (six) hours as needed for moderate pain.     aspirin 81 MG tablet  Take 81 mg by mouth every morning.     atorvastatin 40 MG tablet  Commonly known as:  LIPITOR  Take 40 mg by mouth every evening.     cephALEXin 500 MG capsule  Commonly known as:  KEFLEX  Take 1 capsule (500 mg total) by mouth 4 (four) times daily.     enalapril 10 MG tablet  Commonly known as:  VASOTEC  Take 10 mg by mouth 2 (two) times daily.     GAS-X PO  Take 1-2 tablets by mouth 2 (two) times daily as needed (stomach  irritation).     HYDROcodone-homatropine 5-1.5 MG/5ML syrup  Commonly known as:  HYCODAN  Take 5 mLs by mouth every 4 (four) hours as needed for cough.     ketoconazole 2 % cream  Commonly known as:  NIZORAL  Apply 1 application topically daily as needed for irritation (behind ear.).     metFORMIN 500 MG tablet  Commonly known as:  GLUCOPHAGE  Take 250 mg by mouth 2 (two) times daily with a meal.     metoCLOPramide 10 MG tablet  Commonly known as:  REGLAN  Take 1 tablet (10 mg total) by mouth every 6 (six) hours.     omeprazole 40 MG capsule  Commonly known as:  PRILOSEC  Take 40 mg by mouth every evening.     ondansetron 4 MG disintegrating tablet  Commonly known as:  ZOFRAN ODT  4mg  ODT q4 hours prn nausea/vomit     ondansetron 4 MG disintegrating tablet  Commonly known as:  ZOFRAN-ODT  Take 1 tablet (4 mg total) by mouth once.     ranitidine 150 MG tablet  Commonly known as:  ZANTAC  Take 150-300 mg by mouth 2 (two) times daily  as needed for heartburn.     sulfamethoxazole-trimethoprim 800-160 MG tablet  Commonly known as:  BACTRIM DS,SEPTRA DS  Take 1 tablet by mouth 2 (two) times daily. Reported on 10/01/2015           Follow-up Information    Follow up with Johnathan Hausen B, MD In 4 weeks.   Specialty:  General Surgery   Contact information:   [place] [address] [phone]       Signed: Pedro Earls 10/12/2015, 8:54 AM

## 2015-10-17 ENCOUNTER — Encounter (HOSPITAL_COMMUNITY): Payer: Self-pay | Admitting: Surgery

## 2015-11-13 ENCOUNTER — Other Ambulatory Visit: Payer: Self-pay | Admitting: Obstetrics & Gynecology

## 2015-11-13 ENCOUNTER — Other Ambulatory Visit: Payer: Self-pay | Admitting: Obstetrics and Gynecology

## 2015-11-13 DIAGNOSIS — Z1231 Encounter for screening mammogram for malignant neoplasm of breast: Secondary | ICD-10-CM

## 2015-11-17 ENCOUNTER — Ambulatory Visit (HOSPITAL_COMMUNITY)
Admission: RE | Admit: 2015-11-17 | Discharge: 2015-11-17 | Disposition: A | Discharge status: Home / Self Care | Payer: Medicare Other | Source: Ambulatory Visit | Attending: Obstetrics & Gynecology | Admitting: Obstetrics & Gynecology

## 2015-11-17 DIAGNOSIS — Z1231 Encounter for screening mammogram for malignant neoplasm of breast: Secondary | ICD-10-CM | POA: Diagnosis not present

## 2016-01-11 DIAGNOSIS — E782 Mixed hyperlipidemia: Secondary | ICD-10-CM | POA: Diagnosis not present

## 2016-01-11 DIAGNOSIS — E119 Type 2 diabetes mellitus without complications: Secondary | ICD-10-CM | POA: Diagnosis not present

## 2016-01-11 DIAGNOSIS — Z6826 Body mass index (BMI) 26.0-26.9, adult: Secondary | ICD-10-CM | POA: Diagnosis not present

## 2016-01-11 DIAGNOSIS — I10 Essential (primary) hypertension: Secondary | ICD-10-CM | POA: Diagnosis not present

## 2016-01-11 DIAGNOSIS — Z1389 Encounter for screening for other disorder: Secondary | ICD-10-CM | POA: Diagnosis not present

## 2016-01-11 DIAGNOSIS — Z719 Counseling, unspecified: Secondary | ICD-10-CM | POA: Diagnosis not present

## 2016-01-11 DIAGNOSIS — E663 Overweight: Secondary | ICD-10-CM | POA: Diagnosis not present

## 2016-07-08 DIAGNOSIS — R011 Cardiac murmur, unspecified: Secondary | ICD-10-CM | POA: Diagnosis not present

## 2016-07-08 DIAGNOSIS — R5383 Other fatigue: Secondary | ICD-10-CM | POA: Diagnosis not present

## 2016-07-08 DIAGNOSIS — E663 Overweight: Secondary | ICD-10-CM | POA: Diagnosis not present

## 2016-07-08 DIAGNOSIS — E119 Type 2 diabetes mellitus without complications: Secondary | ICD-10-CM | POA: Diagnosis not present

## 2016-07-08 DIAGNOSIS — I10 Essential (primary) hypertension: Secondary | ICD-10-CM | POA: Diagnosis not present

## 2016-07-08 DIAGNOSIS — D509 Iron deficiency anemia, unspecified: Secondary | ICD-10-CM | POA: Diagnosis not present

## 2016-07-08 DIAGNOSIS — Z23 Encounter for immunization: Secondary | ICD-10-CM | POA: Diagnosis not present

## 2016-07-08 DIAGNOSIS — Z1389 Encounter for screening for other disorder: Secondary | ICD-10-CM | POA: Diagnosis not present

## 2016-07-08 DIAGNOSIS — Z01419 Encounter for gynecological examination (general) (routine) without abnormal findings: Secondary | ICD-10-CM | POA: Diagnosis not present

## 2016-07-08 DIAGNOSIS — Z6828 Body mass index (BMI) 28.0-28.9, adult: Secondary | ICD-10-CM | POA: Diagnosis not present

## 2016-07-08 DIAGNOSIS — E782 Mixed hyperlipidemia: Secondary | ICD-10-CM | POA: Diagnosis not present

## 2016-08-06 DIAGNOSIS — E119 Type 2 diabetes mellitus without complications: Secondary | ICD-10-CM | POA: Diagnosis not present

## 2016-08-06 DIAGNOSIS — H35372 Puckering of macula, left eye: Secondary | ICD-10-CM | POA: Diagnosis not present

## 2016-08-06 DIAGNOSIS — H17823 Peripheral opacity of cornea, bilateral: Secondary | ICD-10-CM | POA: Diagnosis not present

## 2016-08-06 DIAGNOSIS — H25813 Combined forms of age-related cataract, bilateral: Secondary | ICD-10-CM | POA: Diagnosis not present

## 2016-10-23 ENCOUNTER — Emergency Department (HOSPITAL_COMMUNITY)
Admission: EM | Admit: 2016-10-23 | Discharge: 2016-10-23 | Disposition: A | Discharge status: Home / Self Care | Payer: Medicare Other | Attending: Emergency Medicine | Admitting: Emergency Medicine

## 2016-10-23 ENCOUNTER — Emergency Department (HOSPITAL_COMMUNITY): Payer: Medicare Other

## 2016-10-23 ENCOUNTER — Encounter (HOSPITAL_COMMUNITY): Payer: Self-pay

## 2016-10-23 DIAGNOSIS — Z79899 Other long term (current) drug therapy: Secondary | ICD-10-CM | POA: Insufficient documentation

## 2016-10-23 DIAGNOSIS — Z85828 Personal history of other malignant neoplasm of skin: Secondary | ICD-10-CM | POA: Insufficient documentation

## 2016-10-23 DIAGNOSIS — M545 Low back pain, unspecified: Secondary | ICD-10-CM

## 2016-10-23 DIAGNOSIS — R110 Nausea: Secondary | ICD-10-CM | POA: Insufficient documentation

## 2016-10-23 DIAGNOSIS — I10 Essential (primary) hypertension: Secondary | ICD-10-CM | POA: Insufficient documentation

## 2016-10-23 DIAGNOSIS — R1900 Intra-abdominal and pelvic swelling, mass and lump, unspecified site: Secondary | ICD-10-CM | POA: Diagnosis not present

## 2016-10-23 DIAGNOSIS — E119 Type 2 diabetes mellitus without complications: Secondary | ICD-10-CM | POA: Insufficient documentation

## 2016-10-23 DIAGNOSIS — Z7982 Long term (current) use of aspirin: Secondary | ICD-10-CM | POA: Diagnosis not present

## 2016-10-23 DIAGNOSIS — Z7984 Long term (current) use of oral hypoglycemic drugs: Secondary | ICD-10-CM | POA: Diagnosis not present

## 2016-10-23 DIAGNOSIS — F17200 Nicotine dependence, unspecified, uncomplicated: Secondary | ICD-10-CM | POA: Diagnosis not present

## 2016-10-23 DIAGNOSIS — R109 Unspecified abdominal pain: Secondary | ICD-10-CM | POA: Diagnosis present

## 2016-10-23 LAB — CBC WITH DIFFERENTIAL/PLATELET
BASOS PCT: 1 %
Basophils Absolute: 0.1 10*3/uL (ref 0.0–0.1)
Eosinophils Absolute: 3.1 10*3/uL — ABNORMAL HIGH (ref 0.0–0.7)
Eosinophils Relative: 24 %
HEMATOCRIT: 45.1 % (ref 36.0–46.0)
HEMOGLOBIN: 15 g/dL (ref 12.0–15.0)
LYMPHS ABS: 2.2 10*3/uL (ref 0.7–4.0)
Lymphocytes Relative: 17 %
MCH: 29.4 pg (ref 26.0–34.0)
MCHC: 33.3 g/dL (ref 30.0–36.0)
MCV: 88.4 fL (ref 78.0–100.0)
MONO ABS: 1 10*3/uL (ref 0.1–1.0)
MONOS PCT: 8 %
NEUTROS ABS: 6.8 10*3/uL (ref 1.7–7.7)
NEUTROS PCT: 52 %
Platelets: 225 10*3/uL (ref 150–400)
RBC: 5.1 MIL/uL (ref 3.87–5.11)
RDW: 14.4 % (ref 11.5–15.5)
WBC: 13.2 10*3/uL — ABNORMAL HIGH (ref 4.0–10.5)

## 2016-10-23 LAB — BASIC METABOLIC PANEL
Anion gap: 8 (ref 5–15)
BUN: 12 mg/dL (ref 6–20)
CALCIUM: 9 mg/dL (ref 8.9–10.3)
CHLORIDE: 104 mmol/L (ref 101–111)
CO2: 27 mmol/L (ref 22–32)
CREATININE: 0.71 mg/dL (ref 0.44–1.00)
GFR calc Af Amer: >60 mL/min (ref >60)
GFR calc non Af Amer: >60 mL/min (ref >60)
Glucose, Bld: 130 mg/dL — ABNORMAL HIGH (ref 65–99)
Potassium: 4.2 mmol/L (ref 3.5–5.1)
Sodium: 139 mmol/L (ref 135–145)

## 2016-10-23 LAB — URINALYSIS, MICROSCOPIC (REFLEX)

## 2016-10-23 LAB — URINALYSIS, ROUTINE W REFLEX MICROSCOPIC
BILIRUBIN URINE: NEGATIVE
GLUCOSE, UA: NEGATIVE mg/dL
KETONES UR: NEGATIVE mg/dL
NITRITE: NEGATIVE
pH: 6 (ref 5.0–8.0)

## 2016-10-23 MED ORDER — ONDANSETRON HCL 4 MG/2ML IJ SOLN
4.0000 mg | Freq: Once | INTRAMUSCULAR | Status: AC
  Administered 2016-10-23: 4 mg via INTRAVENOUS
  Filled 2016-10-23: qty 2

## 2016-10-23 MED ORDER — MORPHINE SULFATE (PF) 2 MG/ML IV SOLN
2.0000 mg | Freq: Once | INTRAVENOUS | Status: AC
  Administered 2016-10-23: 2 mg via INTRAVENOUS
  Filled 2016-10-23: qty 1

## 2016-10-23 MED ORDER — TRAMADOL HCL 50 MG PO TABS: 50.0000 mg | ORAL_TABLET | Freq: Four times a day (QID) | ORAL | 0 refills | Status: DC | PRN

## 2016-10-23 MED ORDER — CYCLOBENZAPRINE HCL 10 MG PO TABS: 10.0000 mg | ORAL_TABLET | Freq: Two times a day (BID) | ORAL | 0 refills | Status: DC | PRN

## 2016-10-23 NOTE — ED Provider Notes (Signed)
[facility] DEPT Provider Note   CSN: [id number] Arrival date & time: 10/23/16  [id number]     History   Chief Complaint Chief Complaint  Patient presents with  . Flank Pain    HPI Courtney Dorsey is a 71 y.o. female.  Patient presents to the emergency department for evaluation of right flank pain. Patient reports that she first felt the pain 2 days ago. Pain has been waxing and waning. Tonight became severe, constant. Patient reports associated nausea and retching. She has not noticed urinary symptoms. Pain is in the right lower back area, does not radiate. No numbness, tingling, weakness in the lower extremities.      Past Medical History:  Diagnosis Date  . Anemia   . Diabetes mellitus without complication ([place])    "BORDERLINE"  . Diverticulosis   . Heart murmur   . History of hiatal hernia   . History of skin cancer   . History of transfusion   . Hypercholesteremia   . Hypertension   . UTI (lower urinary tract infection)    TAKING ANTIBIOTICS    Patient Active Problem List   Diagnosis Date Noted  . Status post laparoscopic Nissen fundoplication and repair of large type III hiatal hernia Feb 2017 10/10/2015  . IDA (iron deficiency anemia) 01/29/2013  . Microcytic anemia 12/31/2012  . Diabetes mellitus type 2, controlled ([place]) 12/31/2012    Past Surgical History:  Procedure Laterality Date  . CHOLECYSTECTOMY    . COLONOSCOPY  2009   Dr. Aviva Signs  . COLONOSCOPY N/A 01/01/2013   [id number] mucosa in the terminal ileum  Mild diverticulosis in the descending colon and sigmoid colon and sigmoid colon/Moderate sized internal hemorrhoids  . ESOPHAGOGASTRODUODENOSCOPY N/A 01/01/2013   [id number] hiatal hernia/ MODERATE SIZE PARA-ESOPHAGEAL HERNIA/MILD Erosive gastritis. chronic duodenitis c/w peptic duodenitis. no h.pylori or villous atrophy. minimal chronic gastritis.   Marland Kitchen ESOPHAGOGASTRODUODENOSCOPY N/A 02/16/2013   Procedure: ESOPHAGOGASTRODUODENOSCOPY (EGD);  Surgeon:  Daneil Dolin, MD;  Location: [facility] SUITE;  Service: Endoscopy;  Laterality: N/A;  8:30  . GIVENS CAPSULE STUDY N/A 02/16/2013   EGD with capsule placement. no evidence of paraesophageal hernia. SB essentially unremarkable.  Marland Kitchen KNOT REMOVED FROM SCALP    . LAPAROSCOPIC NISSEN FUNDOPLICATION N/A [id number]   Procedure: LAPAROSCOPIC NISSEN FUNDOPLICATION AND HIATAL HERNIA REPAIR;  Surgeon: Johnathan Hausen, MD;  Location: [facility] ORS;  Service: General;  Laterality: N/A;    OB History    No data available       Home Medications    Prior to Admission medications   Medication Sig Start Date End Date Taking? Authorizing Provider  acetaminophen (TYLENOL) 500 MG tablet Take 1,000 mg by mouth every 6 (six) hours as needed for moderate pain.    Historical Provider, MD  aspirin 81 MG tablet Take 81 mg by mouth every morning.     Historical Provider, MD  atorvastatin (LIPITOR) 40 MG tablet Take 40 mg by mouth every evening.     Historical Provider, MD  cephALEXin (KEFLEX) 500 MG capsule Take 1 capsule (500 mg total) by mouth 4 (four) times daily. 10/01/15   Merrily Pew, MD  cyclobenzaprine (FLEXERIL) 10 MG tablet Take 1 tablet (10 mg total) by mouth 2 (two) times daily as needed for muscle spasms. 10/23/16   Orpah Greek, MD  enalapril (VASOTEC) 10 MG tablet Take 10 mg by mouth 2 (two) times daily.    Historical Provider, MD  HYDROcodone-homatropine (HYCODAN) 5-1.5 MG/5ML syrup Take 5 mLs by mouth  every 4 (four) hours as needed for cough. 10/12/15   Johnathan Hausen, MD  ketoconazole (NIZORAL) 2 % cream Apply 1 application topically daily as needed for irritation (behind ear.).    Historical Provider, MD  metFORMIN (GLUCOPHAGE) 500 MG tablet Take 250 mg by mouth 2 (two) times daily with a meal.    Historical Provider, MD  metoCLOPramide (REGLAN) 10 MG tablet Take 1 tablet (10 mg total) by mouth every 6 (six) hours. 10/01/15   Merrily Pew, MD  omeprazole (PRILOSEC) 40 MG capsule Take 40 mg by mouth every  evening.    Historical Provider, MD  ondansetron (ZOFRAN ODT) 4 MG disintegrating tablet 4mg  ODT q4 hours prn nausea/vomit 10/01/15   Merrily Pew, MD  ondansetron (ZOFRAN-ODT) 4 MG disintegrating tablet Take 1 tablet (4 mg total) by mouth once. 10/12/15   Johnathan Hausen, MD  ranitidine (ZANTAC) 150 MG tablet Take 150-300 mg by mouth 2 (two) times daily as needed for heartburn.    Historical Provider, MD  Simethicone (GAS-X PO) Take 1-2 tablets by mouth 2 (two) times daily as needed (stomach irritation).    Historical Provider, MD  sulfamethoxazole-trimethoprim (BACTRIM DS,SEPTRA DS) 800-160 MG tablet Take 1 tablet by mouth 2 (two) times daily. Reported on 10/01/2015 09/06/15   Historical Provider, MD  traMADol (ULTRAM) 50 MG tablet Take 1 tablet (50 mg total) by mouth every 6 (six) hours as needed. 10/23/16   Orpah Greek, MD    Family History Family History  Problem Relation Age of Onset  . Colon cancer Mother     diagnosed around age 62  . Colon cancer Brother     age 24  . Liver disease Neg Hx   . Lung cancer Neg Hx   . Breast cancer Neg Hx   . Ovarian cancer Neg Hx   . Celiac disease Neg Hx     Social History Social History  Substance Use Topics  . Smoking status: Current Every Day Smoker  . Smokeless tobacco: Never Used     Comment: trying to quit, on the patch  . Alcohol use No     Allergies   Patient has no known allergies.   Review of Systems Review of Systems  Gastrointestinal: Positive for nausea.  Musculoskeletal: Positive for back pain.  All other systems reviewed and are negative.    Physical Exam Updated Vital Signs BP 155/69 (BP Location: Left Arm)   Pulse 88   Temp 98.9 F (37.2 C) (Oral)   Resp 17   Ht 5\' 1"  (1.549 m)   Wt 145 lb (65.8 kg)   SpO2 97%   BMI 27.40 kg/m   Physical Exam  Constitutional: She is oriented to person, place, and time. She appears well-developed and well-nourished. No distress.  HENT:  Head: Normocephalic and  atraumatic.  Right Ear: Hearing normal.  Left Ear: Hearing normal.  Nose: Nose normal.  Mouth/Throat: Oropharynx is clear and moist and mucous membranes are normal.  Eyes: Conjunctivae and EOM are normal. Pupils are equal, round, and reactive to light.  Neck: Normal range of motion. Neck supple.  Cardiovascular: Regular rhythm, S1 normal and S2 normal.  Exam reveals no gallop and no friction rub.   No murmur heard. Pulmonary/Chest: Effort normal and breath sounds normal. No respiratory distress. She exhibits no tenderness.  Abdominal: Soft. Normal appearance and bowel sounds are normal. There is no hepatosplenomegaly. There is no tenderness. There is no rebound, no guarding, no tenderness at McBurney's point and negative Murphy's  sign. No hernia.  Musculoskeletal: Normal range of motion.  Neurological: She is alert and oriented to person, place, and time. She has normal strength. No cranial nerve deficit or sensory deficit. Coordination normal. GCS eye subscore is 4. GCS verbal subscore is 5. GCS motor subscore is 6.  Skin: Skin is warm, dry and intact. No rash noted. No cyanosis.  Psychiatric: She has a normal mood and affect. Her speech is normal and behavior is normal. Thought content normal.  Nursing note and vitals reviewed.    ED Treatments / Results  Labs (all labs ordered are listed, but only abnormal results are displayed) Labs Reviewed  CBC WITH DIFFERENTIAL/PLATELET - Abnormal; Notable for the following:       Result Value   WBC 13.2 (*)    Eosinophils Absolute 3.1 (*)    All other components within normal limits  BASIC METABOLIC PANEL - Abnormal; Notable for the following:    Glucose, Bld 130 (*)    All other components within normal limits  URINALYSIS, ROUTINE W REFLEX MICROSCOPIC - Abnormal; Notable for the following:    Specific Gravity, Urine >1.030 (*)    Hgb urine dipstick SMALL (*)    Protein, ur TRACE (*)    Leukocytes, UA TRACE (*)    All other components  within normal limits  URINALYSIS, MICROSCOPIC (REFLEX) - Abnormal; Notable for the following:    Bacteria, UA RARE (*)    Squamous Epithelial / LPF 0-5 (*)    All other components within normal limits    EKG  EKG Interpretation None       Radiology Ct Renal Stone Study  Result Date: 10/23/2016 CLINICAL DATA:  71 year old female with right flank pain. Swelling right abdomen. Prior fundoplication and cholecystectomy. Initial encounter. EXAM: CT ABDOMEN AND PELVIS WITHOUT CONTRAST TECHNIQUE: Multidetector CT imaging of the abdomen and pelvis was performed following the standard protocol without IV contrast. COMPARISON:  10/01/2015 CT. FINDINGS: Lower chest: Tiny calcified granuloma right middle lobe. Prominent mitral valve calcifications. Aortic valve calcification. Coronary artery calcification. Heart size within normal limits. Dense breast parenchyma contain coarse calcification on the left. Hepatobiliary: Taking into account limitation by non contrast imaging, no worrisome hepatic lesion. Postcholecystectomy. Pancreas: Taking into account limitation by non contrast imaging, no mass or inflammation. Spleen: Taking into account limitation by non contrast imaging, no mass or enlargement. Adrenals/Urinary Tract: 3.9 x 2.2 x 2.4 cm complex left adrenal mass contains tiny calcifications. There has been a change in the density of a component of this lesion. This will require further evaluation with contrast-enhanced dedicated adrenal MR as malignancy cannot be excluded. No right adrenal lesion. No obstructing renal or ureteral calculi or evidence hydronephrosis. 6 cm right kidney with tiny peripheral calcifications without significant change. 7 mm slightly exophytic lesion posterior inferior aspect right kidney. It is possible this represents a hyperdense cyst although a solid mass not excluded. This can be assessed with dedicated MR. Decompressed urinary bladder. Stomach/Bowel: Moderately large hiatal  hernia. Postsurgical changes. Dilated fluid-filled loops of proximal to mid small bowel of indeterminate etiology possibly related to enteritis without point of obstruction identified. Low grade small bowel obstruction secondary less likely consideration. Prominent colonic diverticula most notable left colon without extraluminal bowel inflammatory process, free fluid or free air. Vascular/Lymphatic: Atherosclerotic changes aorta without aneurysmal dilation. Atherosclerotic changes iliac arteries. No adenopathy. Reproductive: Endometrium appears prominent.  No adnexal mass noted. Other: No bowel containing hernia. Musculoskeletal: Lumbar degenerative changes most notable L4-5. IMPRESSION: Dilated fluid-filled loops  of proximal to mid small bowel of indeterminate etiology possibly related to enteritis without point of obstruction identified. Low grade small bowel obstruction secondary less likely consideration. Prominent colonic diverticula most notable left colon without extraluminal bowel inflammatory process, free fluid or free air. Moderately large hiatal hernia.  Postsurgical changes No obstructing renal or ureteral calculi. 6 cm right renal cyst without change. **An incidental finding of potential clinical significance has been found. 3.9 x 2.2 x 2.4 cm complex left adrenal mass contains tiny calcifications. There has been a change in the density of a component of this lesion. This will require further evaluation with contrast-enhanced dedicated adrenal MR as malignancy cannot be excluded. **An incidental finding of potential clinical significance has been found. 7 mm slightly exophytic lesion posterior inferior aspect right kidney. It is possible this represents a hyperdense cyst although a solid mass not excluded. This can be assessed with dedicated MR.** **An incidental finding of potential clinical significance has been found. Endometrial lining prominence as previously noted. Follow-up pelvic sonogram  recommended. Electronically Signed   By: Genia Del M.D.   On: 10/23/2016 06:46    Procedures Procedures (including critical care time)  Medications Ordered in ED Medications  morphine 2 MG/ML injection 2 mg (2 mg Intravenous Given 10/23/16 0554)  ondansetron (ZOFRAN) injection 4 mg (4 mg Intravenous Given 10/23/16 0554)     Initial Impression / Assessment and Plan / ED Course  I have reviewed the triage vital signs and the nursing notes.  Pertinent labs & imaging results that were available during my care of the patient were reviewed by me and considered in my medical decision making (see chart for details).     Patient presents to the emergency department for evaluation of right flank and back pain. Blood work was unremarkable other than slight leukocytosis. Urinalysis did have small hemoglobin, no signs of infection. CT scan performed. No ureterolithiasis or hydronephrosis noted. There are incidental findings of left adrenal mass, possible right kidney mass, increased endometrial lining. Recommendations for outpatient MRI and ultrasound made by radiology. These findings were shared with the patient, will need to follow-up with primary care physician for outpatient studies. Will treat for musculoskeletal pain.  Final Clinical Impressions(s) / ED Diagnoses   Final diagnoses:  Acute right-sided low back pain without sciatica    New Prescriptions New Prescriptions   CYCLOBENZAPRINE (FLEXERIL) 10 MG TABLET    Take 1 tablet (10 mg total) by mouth 2 (two) times daily as needed for muscle spasms.   TRAMADOL (ULTRAM) 50 MG TABLET    Take 1 tablet (50 mg total) by mouth every 6 (six) hours as needed.     Orpah Greek, MD 10/23/16 [id number]

## 2016-10-23 NOTE — ED Triage Notes (Signed)
Pain in right flank per pt.  Patient states that she is having pain and swelling in right abdomen.  Started Monday night and had some relief with ibuprofen.  Yesterday I was fine and then the pain started again last night.  I took Ibuprofen around 430 this morning.  Denies problems with urination.  No history of kidney stones.

## 2016-10-25 DIAGNOSIS — Z1389 Encounter for screening for other disorder: Secondary | ICD-10-CM | POA: Diagnosis not present

## 2016-10-25 DIAGNOSIS — E119 Type 2 diabetes mellitus without complications: Secondary | ICD-10-CM | POA: Diagnosis not present

## 2016-10-25 DIAGNOSIS — E669 Obesity, unspecified: Secondary | ICD-10-CM | POA: Diagnosis not present

## 2016-10-25 DIAGNOSIS — Z6829 Body mass index (BMI) 29.0-29.9, adult: Secondary | ICD-10-CM | POA: Diagnosis not present

## 2016-10-25 DIAGNOSIS — I10 Essential (primary) hypertension: Secondary | ICD-10-CM | POA: Diagnosis not present

## 2016-10-25 DIAGNOSIS — R1011 Right upper quadrant pain: Secondary | ICD-10-CM | POA: Diagnosis not present

## 2016-10-25 DIAGNOSIS — R1909 Other intra-abdominal and pelvic swelling, mass and lump: Secondary | ICD-10-CM | POA: Diagnosis not present

## 2016-10-25 DIAGNOSIS — E782 Mixed hyperlipidemia: Secondary | ICD-10-CM | POA: Diagnosis not present

## 2016-10-25 DIAGNOSIS — N2889 Other specified disorders of kidney and ureter: Secondary | ICD-10-CM | POA: Diagnosis not present

## 2016-10-25 DIAGNOSIS — K449 Diaphragmatic hernia without obstruction or gangrene: Secondary | ICD-10-CM | POA: Diagnosis not present

## 2016-10-25 DIAGNOSIS — E663 Overweight: Secondary | ICD-10-CM | POA: Diagnosis not present

## 2016-10-28 ENCOUNTER — Encounter (HOSPITAL_COMMUNITY): Payer: Self-pay | Admitting: Emergency Medicine

## 2016-10-28 ENCOUNTER — Emergency Department (HOSPITAL_COMMUNITY)
Admission: EM | Admit: 2016-10-28 | Discharge: 2016-10-29 | Disposition: A | Discharge status: Home / Self Care | Payer: Medicare Other | Attending: Emergency Medicine | Admitting: Emergency Medicine

## 2016-10-28 DIAGNOSIS — F17200 Nicotine dependence, unspecified, uncomplicated: Secondary | ICD-10-CM | POA: Diagnosis not present

## 2016-10-28 DIAGNOSIS — K5900 Constipation, unspecified: Secondary | ICD-10-CM

## 2016-10-28 DIAGNOSIS — Z7982 Long term (current) use of aspirin: Secondary | ICD-10-CM | POA: Diagnosis not present

## 2016-10-28 DIAGNOSIS — Z79899 Other long term (current) drug therapy: Secondary | ICD-10-CM | POA: Insufficient documentation

## 2016-10-28 DIAGNOSIS — Z7984 Long term (current) use of oral hypoglycemic drugs: Secondary | ICD-10-CM | POA: Diagnosis not present

## 2016-10-28 DIAGNOSIS — I10 Essential (primary) hypertension: Secondary | ICD-10-CM | POA: Insufficient documentation

## 2016-10-28 DIAGNOSIS — E119 Type 2 diabetes mellitus without complications: Secondary | ICD-10-CM | POA: Diagnosis not present

## 2016-10-28 DIAGNOSIS — R109 Unspecified abdominal pain: Secondary | ICD-10-CM | POA: Diagnosis present

## 2016-10-28 DIAGNOSIS — R1031 Right lower quadrant pain: Secondary | ICD-10-CM | POA: Diagnosis not present

## 2016-10-28 LAB — URINALYSIS, ROUTINE W REFLEX MICROSCOPIC
Bacteria, UA: NONE SEEN
Bilirubin Urine: NEGATIVE
Glucose, UA: NEGATIVE mg/dL
Ketones, ur: NEGATIVE mg/dL
Nitrite: NEGATIVE
Protein, ur: NEGATIVE mg/dL
Specific Gravity, Urine: 1.009 (ref 1.005–1.030)
pH: 5 (ref 5.0–8.0)

## 2016-10-28 LAB — CBG MONITORING, ED: GLUCOSE-CAPILLARY: 86 mg/dL (ref 65–99)

## 2016-10-28 LAB — COMPREHENSIVE METABOLIC PANEL
ALK PHOS: 78 U/L (ref 38–126)
ALT: 16 U/L (ref 14–54)
ANION GAP: 9 (ref 5–15)
AST: 16 U/L (ref 15–41)
Albumin: 4.1 g/dL (ref 3.5–5.0)
BILIRUBIN TOTAL: 0.6 mg/dL (ref 0.3–1.2)
BUN: 12 mg/dL (ref 6–20)
CALCIUM: 9.3 mg/dL (ref 8.9–10.3)
CO2: 23 mmol/L (ref 22–32)
Chloride: 107 mmol/L (ref 101–111)
Creatinine, Ser: 0.69 mg/dL (ref 0.44–1.00)
GFR calc non Af Amer: >60 mL/min (ref >60)
Glucose, Bld: 91 mg/dL (ref 65–99)
Potassium: 4 mmol/L (ref 3.5–5.1)
Sodium: 139 mmol/L (ref 135–145)
TOTAL PROTEIN: 6.9 g/dL (ref 6.5–8.1)

## 2016-10-28 LAB — CBC
HCT: 45.3 % (ref 36.0–46.0)
HEMOGLOBIN: 15.4 g/dL — AB (ref 12.0–15.0)
MCH: 29.6 pg (ref 26.0–34.0)
MCHC: 34 g/dL (ref 30.0–36.0)
MCV: 87.1 fL (ref 78.0–100.0)
Platelets: 240 10*3/uL (ref 150–400)
RBC: 5.2 MIL/uL — AB (ref 3.87–5.11)
RDW: 14.2 % (ref 11.5–15.5)
WBC: 11.1 10*3/uL — ABNORMAL HIGH (ref 4.0–10.5)

## 2016-10-28 LAB — LIPASE, BLOOD: Lipase: 17 U/L (ref 11–51)

## 2016-10-28 MED ORDER — SODIUM CHLORIDE 0.9 % IV BOLUS (SEPSIS)
1000.0000 mL | Freq: Once | INTRAVENOUS | Status: AC
  Administered 2016-10-28: 1000 mL via INTRAVENOUS

## 2016-10-28 MED ORDER — MAGNESIUM CITRATE PO SOLN: 1.0000 | Freq: Once | ORAL | Status: DC

## 2016-10-28 NOTE — ED Provider Notes (Addendum)
[facility] DEPT Provider Note   CSN: [id number] Arrival date & time: 10/28/16  1744   By signing my name below, I, Soijett Blue, attest that this documentation has been prepared under the direction and in the presence of Everlene Balls, MD. Electronically Signed: Soijett Blue, ED Scribe. 10/28/16. 1:29 AM.  History   Chief Complaint Chief Complaint  Patient presents with  . Abdominal Pain  . Emesis    HPI Courtney Dorsey is a 71 y.o. female with a PMHx of HTN, DM, who presents to the Emergency Department complaining of intermittent right lateral abdominal pain onset 6 days ago. Pt reports associated constipation x 5 days and right lower back pain. She describes her right lower back pain as dull and burning. Pt has tried 3 doses of miralax, 1 dose of metamucil, and prune juice with no relief of her symptoms. Pt notes that her pain began in her right lower back pain initially and she was evaluated in the ED on 10/23/2016 to rule out a kidney stone. She denies vomiting, difficulty urinating, dysuria, hematuria, fever, and any other symptoms. She state that she has had cholecystectomy and fundoplication surgeries.    The history is provided by the patient. No language interpreter was used.    Past Medical History:  Diagnosis Date  . Anemia   . Diabetes mellitus without complication ([place])    "BORDERLINE"  . Diverticulosis   . Heart murmur   . History of hiatal hernia   . History of skin cancer   . History of transfusion   . Hypercholesteremia   . Hypertension   . UTI (lower urinary tract infection)    TAKING ANTIBIOTICS    Patient Active Problem List   Diagnosis Date Noted  . Status post laparoscopic Nissen fundoplication and repair of large type III hiatal hernia Feb 2017 10/10/2015  . IDA (iron deficiency anemia) 01/29/2013  . Microcytic anemia 12/31/2012  . Diabetes mellitus type 2, controlled ([place]) 12/31/2012    Past Surgical History:  Procedure Laterality Date  .  CHOLECYSTECTOMY    . COLONOSCOPY  2009   Dr. Aviva Signs  . COLONOSCOPY N/A 01/01/2013   [id number] mucosa in the terminal ileum  Mild diverticulosis in the descending colon and sigmoid colon and sigmoid colon/Moderate sized internal hemorrhoids  . ESOPHAGOGASTRODUODENOSCOPY N/A 01/01/2013   [id number] hiatal hernia/ MODERATE SIZE PARA-ESOPHAGEAL HERNIA/MILD Erosive gastritis. chronic duodenitis c/w peptic duodenitis. no h.pylori or villous atrophy. minimal chronic gastritis.   Marland Kitchen ESOPHAGOGASTRODUODENOSCOPY N/A 02/16/2013   Procedure: ESOPHAGOGASTRODUODENOSCOPY (EGD);  Surgeon: Daneil Dolin, MD;  Location: [facility] SUITE;  Service: Endoscopy;  Laterality: N/A;  8:30  . GIVENS CAPSULE STUDY N/A 02/16/2013   EGD with capsule placement. no evidence of paraesophageal hernia. SB essentially unremarkable.  Marland Kitchen KNOT REMOVED FROM SCALP    . LAPAROSCOPIC NISSEN FUNDOPLICATION N/A [id number]   Procedure: LAPAROSCOPIC NISSEN FUNDOPLICATION AND HIATAL HERNIA REPAIR;  Surgeon: Johnathan Hausen, MD;  Location: [facility] ORS;  Service: General;  Laterality: N/A;    OB History    No data available       Home Medications    Prior to Admission medications   Medication Sig Start Date End Date Taking? Authorizing Provider  aspirin 81 MG tablet Take 81 mg by mouth every morning.    Yes Historical Provider, MD  atorvastatin (LIPITOR) 40 MG tablet Take 40 mg by mouth every evening.    Yes Historical Provider, MD  enalapril (VASOTEC) 10 MG tablet Take 10 mg by mouth  2 (two) times daily.   Yes Historical Provider, MD  ketoconazole (NIZORAL) 2 % cream Apply 1 application topically daily as needed for irritation (behind ear.).   Yes Historical Provider, MD  metFORMIN (GLUCOPHAGE) 500 MG tablet Take 250 mg by mouth 2 (two) times daily with a meal.   Yes Historical Provider, MD  oxyCODONE-acetaminophen (PERCOCET/ROXICET) 5-325 MG tablet Take 1 tablet by mouth every 4 (four) hours as needed for moderate pain or severe pain.  10/25/16   Yes Historical Provider, MD  ranitidine (ZANTAC) 150 MG tablet Take 150-300 mg by mouth 2 (two) times daily as needed for heartburn.   Yes Historical Provider, MD  Simethicone (GAS-X PO) Take 1-2 tablets by mouth 2 (two) times daily as needed (stomach irritation).   Yes Historical Provider, MD  cephALEXin (KEFLEX) 500 MG capsule Take 1 capsule (500 mg total) by mouth 4 (four) times daily. Patient not taking: Reported on 10/28/2016 10/01/15   Merrily Pew, MD  cyclobenzaprine (FLEXERIL) 10 MG tablet Take 1 tablet (10 mg total) by mouth 2 (two) times daily as needed for muscle spasms. Patient not taking: Reported on 10/28/2016 10/23/16   Orpah Greek, MD  HYDROcodone-homatropine [facility]) 5-1.5 MG/5ML syrup Take 5 mLs by mouth every 4 (four) hours as needed for cough. Patient not taking: Reported on 10/28/2016 10/12/15   Johnathan Hausen, MD  metoCLOPramide (REGLAN) 10 MG tablet Take 1 tablet (10 mg total) by mouth every 6 (six) hours. Patient not taking: Reported on 10/28/2016 10/01/15   Merrily Pew, MD  sorbitol-magnesium hydroxide-mineral oil-glycerin Place 960 mLs rectally once. 10/29/16 10/29/16  Everlene Balls, MD  sulfamethoxazole-trimethoprim (BACTRIM DS,SEPTRA DS) 800-160 MG tablet Take 1 tablet by mouth 2 (two) times daily. Reported on 10/01/2015 09/06/15   Historical Provider, MD  traMADol (ULTRAM) 50 MG tablet Take 1 tablet (50 mg total) by mouth every 6 (six) hours as needed. Patient not taking: Reported on 10/28/2016 10/23/16   Orpah Greek, MD    Family History Family History  Problem Relation Age of Onset  . Colon cancer Mother     diagnosed around age 46  . Colon cancer Brother     age 39  . Liver disease Neg Hx   . Lung cancer Neg Hx   . Breast cancer Neg Hx   . Ovarian cancer Neg Hx   . Celiac disease Neg Hx     Social History Social History  Substance Use Topics  . Smoking status: Current Every Day Smoker  . Smokeless tobacco: Never Used     Comment: trying to  quit, on the patch  . Alcohol use No     Allergies   Patient has no known allergies.   Review of Systems Review of Systems A complete 10 system review of systems was obtained and all systems are negative except as noted in the HPI and PMH.   Physical Exam Updated Vital Signs BP 158/100 (BP Location: Left Arm)   Pulse 99   Temp 98.1 F (36.7 C) (Oral)   Resp 20   SpO2 95%   Physical Exam  Constitutional: She is oriented to person, place, and time. She appears well-developed and well-nourished. No distress.  HENT:  Head: Normocephalic and atraumatic.  Nose: Nose normal.  Mouth/Throat: No oropharyngeal exudate.  Dry oropharynx  Eyes: Conjunctivae and EOM are normal. Pupils are equal, round, and reactive to light. No scleral icterus.  Neck: Normal range of motion. Neck supple. No JVD present. No tracheal deviation present. No  thyromegaly present.  Cardiovascular: Normal rate, regular rhythm and normal heart sounds.  Exam reveals no gallop and no friction rub.   No murmur heard. Pulmonary/Chest: Effort normal and breath sounds normal. No respiratory distress. She has no wheezes. She exhibits no tenderness.  Abdominal: Soft. Bowel sounds are normal. She exhibits no distension and no mass. There is no tenderness. There is no rebound and no guarding.  Musculoskeletal: Normal range of motion. She exhibits no edema or tenderness.  Lymphadenopathy:    She has no cervical adenopathy.  Neurological: She is alert and oriented to person, place, and time. No cranial nerve deficit. She exhibits normal muscle tone.  Skin: Skin is warm and dry. No rash noted. No erythema. No pallor.  Nursing note and vitals reviewed.    ED Treatments / Results  DIAGNOSTIC STUDIES: Oxygen Saturation is 96% on RA, nl by my interpretation.    COORDINATION OF CARE: 11:46 PM Discussed treatment plan with pt at bedside which includes labs, UA, CT abdomen pelvis, and pt agreed to plan.   Labs (all labs  ordered are listed, but only abnormal results are displayed) Labs Reviewed  CBC - Abnormal; Notable for the following:       Result Value   WBC 11.1 (*)    RBC 5.20 (*)    Hemoglobin 15.4 (*)    All other components within normal limits  URINALYSIS, ROUTINE W REFLEX MICROSCOPIC - Abnormal; Notable for the following:    Hgb urine dipstick SMALL (*)    Leukocytes, UA SMALL (*)    Squamous Epithelial / LPF 0-5 (*)    All other components within normal limits  LIPASE, BLOOD  COMPREHENSIVE METABOLIC PANEL  CBG MONITORING, ED    EKG  EKG Interpretation None       Radiology Ct Abdomen Pelvis W Contrast  Result Date: 10/29/2016 CLINICAL DATA:  Right flank pain for 6 days. Pain radiates to right lower quadrant. EXAM: CT ABDOMEN AND PELVIS WITH CONTRAST TECHNIQUE: Multidetector CT imaging of the abdomen and pelvis was performed using the standard protocol following bolus administration of intravenous contrast. CONTRAST:  100 cc Isovue-300 IV COMPARISON:  Noncontrast CT 6 days prior. CT 10/01/2015 also reviewed FINDINGS: Lower chest: Dependent atelectasis in both lower lobes, right greater than left. No pleural fluid. Moderate-sized hiatal hernia is unchanged from recent prior exam, postsurgical change with surgical staples at the diaphragmatic hiatus. Hepatobiliary: Postcholecystectomy. Biliary prominence, a common finding postcholecystectomy. No calcified stones. No focal hepatic lesion. Pancreas: No ductal dilatation or inflammation. Spleen: Normal in size without focal abnormality. Adrenals/Urinary Tract: Heterogeneous left adrenal mass measuring 3.4 x 2.9 x 2.6 cm contains a punctate calcification. Normal right adrenal gland. No hydronephrosis or perinephric edema. 5.5 cm cyst in the right kidney is unchanged, question peripheral wall calcification. The adjacent exophytic lesion on prior noncontrast CT is not as well visualized currently. Left kidney appears normal. Symmetric excretion on  delayed phase imaging. Urinary bladder is minimally distended. Stomach/Bowel: Unchanged moderate size hiatal hernia. Normal appendix. No small bowel dilatation or inflammation. Previous prominent fluid-filled small bowel is now decompressed. Moderate diffuse colonic stool burden. Sigmoid colonic tortuosity colonic diverticular most prominent in the descending and sigmoid colon without acute inflammation. Vascular/Lymphatic: Mild aortic and branch atherosclerosis. No aneurysm. No adenopathy. Reproductive: Endometrial thickening persists.  No adnexal mass. Other: No free air, free fluid, or intra-abdominal fluid collection. Musculoskeletal: There are no acute or suspicious osseous abnormalities. Stable degenerative change in the spine. IMPRESSION: 1. Moderate stool burden  suggests constipation. No bowel obstruction or inflammation. Prominent fluid-filled small bowel on prior exam has resolved. 2. Normal appendix. 3. 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 Nonemergent adrenal protocol MRI again recommended for characterization. 4. Endometrial prominence persists. Nonemergent pelvic ultrasound again recommended for characterization. 5. Moderate hiatal hernia with postsurgical change. 6. Aortic atherosclerosis without aneurysm. Electronically Signed   By: Jeb Levering M.D.   On: 10/29/2016 01:05    Procedures Procedures (including critical care time)  Medications Ordered in ED Medications  magnesium citrate solution 1 Bottle (not administered)  sodium chloride 0.9 % bolus 1,000 mL (1,000 mLs Intravenous New Bag/Given 10/28/16 2344)  iopamidol (ISOVUE-300) 61 % injection 100 mL (100 mLs Intravenous Contrast Given 10/29/16 0037)     Initial Impression / Assessment and Plan / ED Course  I have reviewed the triage vital signs and the nursing notes.  Pertinent labs & imaging results that were available during my care of the patient were reviewed by me and considered in my medical decision making (see chart  for details).     Patient presents to the ED for continued abdominal pain and no BM since her last visit 5 days ago.  Prior CT expresses concern for possible early SBO.  Will repeat CT today with contrast for further evaluation.  She was given IVF as well.  Will continue to monitor for improvement. Labs and urine are unchanged today.  1:29 AM CT only reveals constipation.  She was informed of need to follow up for endometrial thickening and the mass seen.  She was given magnesium citrate and SMOG enema to take at home. She appears well and in NAD. PCP fu advised within 3 days. VS remain within her normal limits and she is safe for DC.     Final Clinical Impressions(s) / ED Diagnoses   Final diagnoses:  Constipation, unspecified constipation type    New Prescriptions New Prescriptions   SORBITOL-MAGNESIUM HYDROXIDE-MINERAL OIL-GLYCERIN    Place 960 mLs rectally once.     I personally performed the services described in this documentation, which was scribed in my presence. The recorded information has been reviewed and is accurate.           Everlene Balls, MD 10/29/16 [phone]

## 2016-10-28 NOTE — ED Triage Notes (Addendum)
Pt reports ongoing R flank/abd pain since Tuesday that has progressively gotten worse. Pt reports she has not had a BM in 5 days and tried miralax/prune juice with no relief. Also having emesis.

## 2016-10-29 ENCOUNTER — Emergency Department (HOSPITAL_COMMUNITY): Payer: Medicare Other

## 2016-10-29 ENCOUNTER — Encounter (HOSPITAL_COMMUNITY): Payer: Self-pay

## 2016-10-29 DIAGNOSIS — R1031 Right lower quadrant pain: Secondary | ICD-10-CM | POA: Diagnosis not present

## 2016-10-29 DIAGNOSIS — K5900 Constipation, unspecified: Secondary | ICD-10-CM | POA: Diagnosis not present

## 2016-10-29 MED ORDER — SORBITOL 70 % SOLN: 960.0000 mL | TOPICAL_OIL | Freq: Once | ORAL | 0 refills | Status: AC

## 2016-10-29 MED ORDER — IOPAMIDOL (ISOVUE-300) INJECTION 61%
INTRAVENOUS | Status: AC
Start: 2016-10-29 — End: 2016-10-29
  Administered 2016-10-29: 100 mL via INTRAVENOUS
  Filled 2016-10-29: qty 100

## 2016-10-29 MED ORDER — MAGNESIUM CITRATE PO SOLN
1.0000 | Freq: Once | ORAL | Status: AC
  Administered 2016-10-29: 1 via ORAL
  Filled 2016-10-29: qty 296

## 2016-10-29 MED ORDER — IOPAMIDOL (ISOVUE-300) INJECTION 61%
100.0000 mL | Freq: Once | INTRAVENOUS | Status: AC | PRN
  Administered 2016-10-29: 100 mL via INTRAVENOUS

## 2016-11-01 ENCOUNTER — Telehealth: Payer: Self-pay | Admitting: Gastroenterology

## 2016-11-01 MED ORDER — NALOXEGOL OXALATE 12.5 MG PO TABS: 12.5000 mg | ORAL_TABLET | Freq: Every day | ORAL | 11 refills | Status: DC

## 2016-11-01 MED ORDER — LUBIPROSTONE 24 MCG PO CAPS: 24.0000 ug | ORAL_CAPSULE | Freq: Two times a day (BID) | ORAL | 11 refills | Status: DC

## 2016-11-01 NOTE — Addendum Note (Signed)
Addended by: Danie Binder on: 11/01/2016 05:51 PM   Modules accepted: Orders

## 2016-11-01 NOTE — Telephone Encounter (Signed)
MOVANTIK NEEDS PA. HUSBAND CALLED RX FOR AMITIZA SENT. He will call if needed. One daily @6p  then BID IF NO BM BY SUN am. MED WARNING DISCUSSED.

## 2016-11-01 NOTE — Telephone Encounter (Signed)
HUSBAND CALLED. PT HAVING TROUBLE MOVING BOWELS PAST 2 WEEKS. SHE HAD TWO CT SCAN AND NO MEDS OTC/RX/ENEMAS HAVE MADE HER HAVE A BOWEL MOVEMENT. TAKING PERCOCET IF NEEDED. HAVING TROUBLE WITH LOW BACK PAIN. ADD MOVATNIK 12.5 MG DAILY. IF NO BM BY SUN, TAKE 2 MOVANTIK. OPV NET WEEK IF POSSIBLE, Dx: CONSTIPATION.

## 2016-11-02 NOTE — Telephone Encounter (Signed)
PT CALLED LAST NIGHT. Pt cant' afford AMITIZA W/O PA. SAMPLES PROVIDED  AMITIZA #8 LOT 3 [id number] EXP 10/2019. TAKE ONE SAT SUN. IF NO BM TAKE MOVANTIK ONE ON MON AND THEN TUES. MOVANYIK #3 25 MG TABS LOT [id number] EXP 05/2017

## 2016-11-04 NOTE — Telephone Encounter (Signed)
PATIENT COMING IN THIS WEEK AND IS AWARE OF DATE AND TIME

## 2016-11-07 ENCOUNTER — Encounter: Payer: Self-pay | Admitting: Gastroenterology

## 2016-11-07 ENCOUNTER — Ambulatory Visit (INDEPENDENT_AMBULATORY_CARE_PROVIDER_SITE_OTHER): Payer: Medicare Other | Admitting: Gastroenterology

## 2016-11-07 ENCOUNTER — Ambulatory Visit (HOSPITAL_COMMUNITY)
Admission: RE | Admit: 2016-11-07 | Discharge: 2016-11-07 | Disposition: A | Discharge status: Home / Self Care | Payer: Medicare Other | Source: Ambulatory Visit | Attending: Gastroenterology | Admitting: Gastroenterology

## 2016-11-07 VITALS — BP 137/86 | HR 111 | Temp 98.0°F | Ht 61.0 in | Wt 141.2 lb

## 2016-11-07 DIAGNOSIS — R10A1 Flank pain, right side: Secondary | ICD-10-CM

## 2016-11-07 DIAGNOSIS — K5909 Other constipation: Secondary | ICD-10-CM

## 2016-11-07 DIAGNOSIS — K5900 Constipation, unspecified: Secondary | ICD-10-CM | POA: Insufficient documentation

## 2016-11-07 DIAGNOSIS — R109 Unspecified abdominal pain: Secondary | ICD-10-CM | POA: Insufficient documentation

## 2016-11-07 DIAGNOSIS — K449 Diaphragmatic hernia without obstruction or gangrene: Secondary | ICD-10-CM | POA: Diagnosis not present

## 2016-11-07 DIAGNOSIS — R1900 Intra-abdominal and pelvic swelling, mass and lump, unspecified site: Secondary | ICD-10-CM | POA: Insufficient documentation

## 2016-11-07 DIAGNOSIS — K3189 Other diseases of stomach and duodenum: Secondary | ICD-10-CM | POA: Insufficient documentation

## 2016-11-07 NOTE — Progress Notes (Addendum)
DORIS, PLEASE INFORM PATIENT, call her at [phone]  Spoke with Dr. Misty Stanley, radiologist, regarding patient. He reviewed CT a/p with contrast done last week. He states her right 9th rib protrudes more straight out then on the left side and also she has more fascial laxity (tissues that connect with abdominal wall) on the right side which explains her bulging in the ruq. There was no mass or hernia in the location.   As far as her right posterior flank pain, nothing to suggest referred back pain. She has 5.5cm cyst in the right kidney but unclear that this would create pain especially only when laying down as she reports. Plus, it has been stable in size since 07/2015.   Her abd film today shows minimal stool. Hiatal hernia and gas in the stomach noted.   -->would recommend she take Amitiza 61mcg once or twice daily as she feels she needs it, hold for more than 3 loose stools per day. Minimize pain medication at night to see if her pain is better.  -->she needs MRI abdomen for complex left adrenal mass (adrenal protocol) AND 7 mm exophytic lesion right kidney seen on prior CTs. WE CAN ORDER OR HER PCP. PATIENT'S CHOICE  -->patient needs to follow up with gyn or pcp for prominent endometrial lining on CT.

## 2016-11-07 NOTE — Progress Notes (Signed)
I called and informed pt. She would like for [place] to schedule the MRI. Said she will see PCP for the endometrial lining ( she does not have a GYN). She had samples of the Amitiza to start. Forwarding to [facility] to schedule the MRI.

## 2016-11-07 NOTE — Progress Notes (Signed)
See addendum to ov note today

## 2016-11-07 NOTE — Assessment & Plan Note (Signed)
71 year old female with recent acute onset pain in the right posterior flank, constipation, concern for right upper quadrant abdominal bulge who presents for urgent office visit. Initial renal protocol CT concerning for small bowel dilation and/or early obstruction is a remote possibility. Follow-up CT couple of days later without evidence of bowel extraction but significant colonic stool burden. Nothing reported to explain right upper quadrant abdominal bulge.  Patient has had some loose watery stools with multiple laxatives as outlined. Does not feel constipated, currently feels well. She continues to have some right posterior flank pain especially at bedtime when she lays down.  Reevaluate stool load via abdominal film today. Continue and Amitiza 24 g twice a day, stop Movantik. Don't take movantik if not taking opioids. Generally she does not take pain medication, recently provided with acute illness. I would be concerned her right posterior flank pain may be musculoskeletal in nature and/or referred from her back.  Plan to review CT films with radiologist regarding right upper quadrant bolus to see if they can see any identifying factors.  She will require MRI imaging of complex left adrenal mass and exophytic right kidney mass as well as follow-up with GI and for endometrial lining prominence. Will have her see her PCP for these follow-up studies.

## 2016-11-07 NOTE — Progress Notes (Signed)
I spoke to pt and informed her of the findings. She would like [place] to order the MRI. Forwarding to [facility] to schedule. She will see PCP about the endomethial lining.

## 2016-11-07 NOTE — Patient Instructions (Signed)
1. Only take Movantik IF you have taken a pain pill and you feel constipated.  2. Take Amitiza 83mcg twice daily with food.  3. I will review your CT with radiologist and get back with you.  4. Plain abdominal film today to evaluate stool load.

## 2016-11-07 NOTE — Progress Notes (Signed)
Primary Care Physician: Purvis Kilts, MD  Primary Gastroenterologist:  Barney Drain, MD   Chief Complaint  Patient presents with  . Constipation    has been to ER x 2; RUQ swollen; hasn't had good BM in 2 weeks    HPI: Courtney Dorsey is a 71 y.o. female here for urgent office visit. Patient reports she was doing well until Feb 17th. She felt dizzy and passed out. Felt fine afterward. Event was not witnessed. Later that evening, she developed dry heaves, cannot vomit since fundoplication. Feb 19th, she woke up in middle of night with pain in the right posterior flank area. She noticed a bulging area in ruq below her ribs. She had never noticed before. Finally went to ED on 2/21. CT renal stone protocol Showed left adrenal mass needing further imaging via MR, dilated fluid filled loops of proximal to mid small bowel of indeterminate etiology cannot exclude low-grade small bowel obstruction but not felt to be likely, moderately large hiatal hernia, postsurgical changes, 6 cm right renal cyst without change, 7 mm slightly exophytic lesion posterior inferior aspect of the right kidney needing MR, endometrial lining prominence. WBC 13,200. ED physician treated for musculoskeletal pain with tramadol and Flexeril.  Patient seen in the ED 5 days later with persistent symptoms. Also noted no bowel movement in 5 days. She had tried 3 doses of MiraLAX, Metamucil, prune juice without relief. CT of abdomen pelvis with contrast this time on 4/27 showed no small bowel dilation or inflammation. Moderate diffuse colonic stool burden. Other incidental findings as previously outlined requiring further imaging via MRI. Given magnesium citrate, smog enema. White blood cell count 11,000.  Patient called Dr. Oneida Alar 6 days ago and was provided and Amitiza and Biobrane take samples. Patient has taken one or the other each day but feels like she is not getting any significant results. She is passing small  amount liquidy stool. She has also used Ex-Lax. Used suppository this morning with small soft stool.  Typically does not take chronic pain medication. Recent pain medication provided for acute pain. Advised not to take Movantik unless she is taking pain medication. Prior to this incident she was having a bowel movement about every other day. Never really felt constipated. Feels fine now. Mostly concerned about bulge in the right upper quadrant. Also notes that she continues to have right posterior flank pain especially when she lays down at night. Taking pain medication at bedtime. No known back issues. No urinary symptoms.  Her PCP referred her to [facility] for right upper quadrant bulge, appointment in couple of weeks.    Current Outpatient Prescriptions  Medication Sig Dispense Refill  . aspirin 81 MG tablet Take 81 mg by mouth every morning.     Marland Kitchen atorvastatin (LIPITOR) 40 MG tablet Take 40 mg by mouth every evening.     . cyclobenzaprine (FLEXERIL) 10 MG tablet Take 1 tablet (10 mg total) by mouth 2 (two) times daily as needed for muscle spasms. 20 tablet 0  . enalapril (VASOTEC) 10 MG tablet Take 10 mg by mouth 2 (two) times daily.    Marland Kitchen ketoconazole (NIZORAL) 2 % cream Apply 1 application topically daily as needed for irritation (behind ear.).    Marland Kitchen lubiprostone (AMITIZA) 24 MCG capsule Take 1 capsule (24 mcg total) by mouth 2 (two) times daily with a meal. 60 capsule 11  . metFORMIN (GLUCOPHAGE) 500 MG tablet Take 250 mg by mouth 2 (two) times daily  with a meal.    . naloxegol oxalate (MOVANTIK) 12.5 MG TABS tablet Take 1 tablet (12.5 mg total) by mouth daily. TAKE 1 OR 2 DAILY 30 tablet 11  . oxyCODONE-acetaminophen (PERCOCET/ROXICET) 5-325 MG tablet Take 1 tablet by mouth every 4 (four) hours as needed for moderate pain or severe pain.     . ranitidine (ZANTAC) 150 MG tablet Take 150-300 mg by mouth 2 (two) times daily as needed for heartburn.    . Simethicone (GAS-X PO) Take 1-2 tablets by  mouth 2 (two) times daily as needed (stomach irritation).    . traMADol (ULTRAM) 50 MG tablet Take 1 tablet (50 mg total) by mouth every 6 (six) hours as needed. (Patient not taking: Reported on 10/28/2016) 15 tablet 0   No current facility-administered medications for this visit.     Allergies as of 11/07/2016  . (No Known Allergies)   Past Medical History:  Diagnosis Date  . Anemia   . Diabetes mellitus without complication ([place])    "BORDERLINE"  . Diverticulosis   . Heart murmur   . History of hiatal hernia   . History of skin cancer   . History of transfusion   . Hypercholesteremia   . Hypertension   . UTI (lower urinary tract infection)    TAKING ANTIBIOTICS   Past Surgical History:  Procedure Laterality Date  . CHOLECYSTECTOMY    . COLONOSCOPY  2009   Dr. Aviva Signs  . COLONOSCOPY N/A 01/01/2013   NFA:OZHYQM mucosa in the terminal ileum  Mild diverticulosis in the descending colon and sigmoid colon and sigmoid colon/Moderate sized internal hemorrhoids  . ESOPHAGOGASTRODUODENOSCOPY N/A 01/01/2013   VHQ:IONGE hiatal hernia/ MODERATE SIZE PARA-ESOPHAGEAL HERNIA/MILD Erosive gastritis. chronic duodenitis c/w peptic duodenitis. no h.pylori or villous atrophy. minimal chronic gastritis.   Marland Kitchen ESOPHAGOGASTRODUODENOSCOPY N/A 02/16/2013   Procedure: ESOPHAGOGASTRODUODENOSCOPY (EGD);  Surgeon: Daneil Dolin, MD;  Location: [facility];  Service: Endoscopy;  Laterality: N/A;  8:30  . GIVENS CAPSULE STUDY N/A 02/16/2013   EGD with capsule placement. no evidence of paraesophageal hernia. SB essentially unremarkable.  Marland Kitchen KNOT REMOVED FROM SCALP    . LAPAROSCOPIC NISSEN FUNDOPLICATION N/A 05/07/2840   Procedure: LAPAROSCOPIC NISSEN FUNDOPLICATION AND HIATAL HERNIA REPAIR;  Surgeon: Johnathan Hausen, MD;  Location: [facility];  Service: General;  Laterality: N/A;   Family History  Problem Relation Age of Onset  . Colon cancer Mother     diagnosed around age 44  . Colon cancer Brother     age  11  . Liver disease Neg Hx   . Lung cancer Neg Hx   . Breast cancer Neg Hx   . Ovarian cancer Neg Hx   . Celiac disease Neg Hx    Social History   Social History  . Marital status: Married    Spouse name: N/A  . Number of children: 1  . Years of education: N/A   Occupational History  .  Retired   Social History Main Topics  . Smoking status: Current Every Day Smoker  . Smokeless tobacco: Never Used     Comment: trying to quit, on the patch  . Alcohol use No  . Drug use: No  . Sexual activity: Yes    Birth control/ protection: Post-menopausal   Other Topics Concern  . None   Social History Narrative  . None    ROS:  General: Negative for anorexia, weight loss, fever, chills, fatigue, weakness. ENT: Negative for hoarseness, difficulty swallowing , nasal congestion.  CV: Negative for chest pain, angina, palpitations, dyspnea on exertion, peripheral edema.  Respiratory: Negative for dyspnea at rest, dyspnea on exertion, cough, sputum, wheezing.  GI: See history of present illness. GU:  Negative for dysuria, hematuria, urinary incontinence, urinary frequency, nocturnal urination.  Endo: Negative for unusual weight change.    Physical Examination:   BP 137/86   Pulse (!) 111   Temp 98 F (36.7 C) (Oral)   Ht 5\' 1"  (1.549 m)   Wt 141 lb 3.2 oz (64 kg)   BMI 26.68 kg/m   General: Well-nourished, well-developed in no acute distress.  Eyes: No icterus. Mouth: Oropharyngeal mucosa moist and pink , no lesions erythema or exudate. Lungs: Clear to auscultation bilaterally.  Heart: Regular rate and rhythm, no murmurs rubs or gallops.  Abdomen: Bowel sounds are normal, nontender, nondistended, no hepatosplenomegaly or masses, no abdominal bruits or hernia , no rebound or guarding.  With standing she is prominence of ruq but with laying this disappears. No obvious hernia or mass. No cva tenderness or abd pain on exam today. abd soft.  Extremities: No lower extremity edema.  No clubbing or deformities. Neuro: Alert and oriented x 4   Skin: Warm and dry, no jaundice.   Psych: Alert and cooperative, normal mood and affect.  Labs:  Lab Results  Component Value Date   CREATININE 0.69 10/28/2016   BUN 12 10/28/2016   NA 139 10/28/2016   K 4.0 10/28/2016   CL 107 10/28/2016   CO2 23 10/28/2016   Lab Results  Component Value Date   ALT 16 10/28/2016   AST 16 10/28/2016   ALKPHOS 78 10/28/2016   BILITOT 0.6 10/28/2016   Lab Results  Component Value Date   WBC 11.1 (H) 10/28/2016   HGB 15.4 (H) 10/28/2016   HCT 45.3 10/28/2016   MCV 87.1 10/28/2016   PLT 240 10/28/2016   Lab Results  Component Value Date   LIPASE 17 10/28/2016     Imaging Studies: Ct Abdomen Pelvis W Contrast  Result Date: 10/29/2016 CLINICAL DATA:  Right flank pain for 6 days. Pain radiates to right lower quadrant. EXAM: CT ABDOMEN AND PELVIS WITH CONTRAST TECHNIQUE: Multidetector CT imaging of the abdomen and pelvis was performed using the standard protocol following bolus administration of intravenous contrast. CONTRAST:  100 cc Isovue-300 IV COMPARISON:  Noncontrast CT 6 days prior. CT 10/01/2015 also reviewed FINDINGS: Lower chest: Dependent atelectasis in both lower lobes, right greater than left. No pleural fluid. Moderate-sized hiatal hernia is unchanged from recent prior exam, postsurgical change with surgical staples at the diaphragmatic hiatus. Hepatobiliary: Postcholecystectomy. Biliary prominence, a common finding postcholecystectomy. No calcified stones. No focal hepatic lesion. Pancreas: No ductal dilatation or inflammation. Spleen: Normal in size without focal abnormality. Adrenals/Urinary Tract: Heterogeneous left adrenal mass measuring 3.4 x 2.9 x 2.6 cm contains a punctate calcification. Normal right adrenal gland. No hydronephrosis or perinephric edema. 5.5 cm cyst in the right kidney is unchanged, question peripheral wall calcification. The adjacent exophytic lesion on  prior noncontrast CT is not as well visualized currently. Left kidney appears normal. Symmetric excretion on delayed phase imaging. Urinary bladder is minimally distended. Stomach/Bowel: Unchanged moderate size hiatal hernia. Normal appendix. No small bowel dilatation or inflammation. Previous prominent fluid-filled small bowel is now decompressed. Moderate diffuse colonic stool burden. Sigmoid colonic tortuosity colonic diverticular most prominent in the descending and sigmoid colon without acute inflammation. Vascular/Lymphatic: Mild aortic and branch atherosclerosis. No aneurysm. No adenopathy. Reproductive: Endometrial thickening persists.  No adnexal mass. Other: No free air, free fluid, or intra-abdominal fluid collection. Musculoskeletal: There are no acute or suspicious osseous abnormalities. Stable degenerative change in the spine. IMPRESSION: 1. Moderate stool burden suggests constipation. No bowel obstruction or inflammation. Prominent fluid-filled small bowel on prior exam has resolved. 2. Normal appendix. 3. 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 Nonemergent adrenal protocol MRI again recommended for characterization. 4. Endometrial prominence persists. Nonemergent pelvic ultrasound again recommended for characterization. 5. Moderate hiatal hernia with postsurgical change. 6. Aortic atherosclerosis without aneurysm. Electronically Signed   By: Jeb Levering M.D.   On: 10/29/2016 01:05   Ct Renal Stone Study  Result Date: 10/23/2016 CLINICAL DATA:  71 year old female with right flank pain. Swelling right abdomen. Prior fundoplication and cholecystectomy. Initial encounter. EXAM: CT ABDOMEN AND PELVIS WITHOUT CONTRAST TECHNIQUE: Multidetector CT imaging of the abdomen and pelvis was performed following the standard protocol without IV contrast. COMPARISON:  10/01/2015 CT. FINDINGS: Lower chest: Tiny calcified granuloma right middle lobe. Prominent mitral valve calcifications. Aortic valve  calcification. Coronary artery calcification. Heart size within normal limits. Dense breast parenchyma contain coarse calcification on the left. Hepatobiliary: Taking into account limitation by non contrast imaging, no worrisome hepatic lesion. Postcholecystectomy. Pancreas: Taking into account limitation by non contrast imaging, no mass or inflammation. Spleen: Taking into account limitation by non contrast imaging, no mass or enlargement. Adrenals/Urinary Tract: 3.9 x 2.2 x 2.4 cm complex left adrenal mass contains tiny calcifications. There has been a change in the density of a component of this lesion. This will require further evaluation with contrast-enhanced dedicated adrenal MR as malignancy cannot be excluded. No right adrenal lesion. No obstructing renal or ureteral calculi or evidence hydronephrosis. 6 cm right kidney with tiny peripheral calcifications without significant change. 7 mm slightly exophytic lesion posterior inferior aspect right kidney. It is possible this represents a hyperdense cyst although a solid mass not excluded. This can be assessed with dedicated MR. Decompressed urinary bladder. Stomach/Bowel: Moderately large hiatal hernia. Postsurgical changes. Dilated fluid-filled loops of proximal to mid small bowel of indeterminate etiology possibly related to enteritis without point of obstruction identified. Low grade small bowel obstruction secondary less likely consideration. Prominent colonic diverticula most notable left colon without extraluminal bowel inflammatory process, free fluid or free air. Vascular/Lymphatic: Atherosclerotic changes aorta without aneurysmal dilation. Atherosclerotic changes iliac arteries. No adenopathy. Reproductive: Endometrium appears prominent.  No adnexal mass noted. Other: No bowel containing hernia. Musculoskeletal: Lumbar degenerative changes most notable L4-5. IMPRESSION: Dilated fluid-filled loops of proximal to mid small bowel of indeterminate  etiology possibly related to enteritis without point of obstruction identified. Low grade small bowel obstruction secondary less likely consideration. Prominent colonic diverticula most notable left colon without extraluminal bowel inflammatory process, free fluid or free air. Moderately large hiatal hernia.  Postsurgical changes No obstructing renal or ureteral calculi. 6 cm right renal cyst without change. An incidental finding of potential clinical significance has been found. 3.9 x 2.2 x 2.4 cm complex left adrenal mass contains tiny calcifications. There has been a change in the density of a component of this lesion. This will require further evaluation with contrast-enhanced dedicated adrenal MR as malignancy cannot be excluded. An incidental finding of potential clinical significance has been found. 7 mm slightly exophytic lesion posterior inferior aspect right kidney. It is possible this represents a hyperdense cyst although a solid mass not excluded. This can be assessed with dedicated MR.An incidental finding of potential clinical significance has been found. Endometrial lining prominence  as previously noted. Follow-up pelvic sonogram recommended Electronically Signed   By: Genia Del M.D.   On: 10/23/2016 06:46

## 2016-11-07 NOTE — Progress Notes (Signed)
cc'ed to pcp °

## 2016-11-08 ENCOUNTER — Other Ambulatory Visit: Payer: Self-pay

## 2016-11-08 DIAGNOSIS — E278 Other specified disorders of adrenal gland: Secondary | ICD-10-CM

## 2016-11-08 NOTE — Progress Notes (Signed)
Pt is set up for MRI on 11/14/16 @ 8:00. She is aware

## 2016-11-13 ENCOUNTER — Ambulatory Visit: Payer: Medicare Other | Admitting: Gastroenterology

## 2016-11-14 ENCOUNTER — Ambulatory Visit (HOSPITAL_COMMUNITY)
Admission: RE | Admit: 2016-11-14 | Discharge: 2016-11-14 | Disposition: A | Discharge status: Home / Self Care | Payer: Medicare Other | Source: Ambulatory Visit | Attending: Gastroenterology | Admitting: Gastroenterology

## 2016-11-14 DIAGNOSIS — N281 Cyst of kidney, acquired: Secondary | ICD-10-CM | POA: Diagnosis not present

## 2016-11-14 DIAGNOSIS — K449 Diaphragmatic hernia without obstruction or gangrene: Secondary | ICD-10-CM | POA: Insufficient documentation

## 2016-11-14 DIAGNOSIS — E279 Disorder of adrenal gland, unspecified: Secondary | ICD-10-CM | POA: Insufficient documentation

## 2016-11-14 DIAGNOSIS — E278 Other specified disorders of adrenal gland: Secondary | ICD-10-CM

## 2016-11-14 MED ORDER — GADOBENATE DIMEGLUMINE 529 MG/ML IV SOLN
12.0000 mL | Freq: Once | INTRAVENOUS | Status: AC | PRN
  Administered 2016-11-14: 12 mL via INTRAVENOUS

## 2016-11-20 NOTE — Progress Notes (Signed)
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 She has 5.4cm right renal cyst and 84mm right renal complex cyst. Moderate to large hh. No further imaging needed.   Would recommend f/u OV with [facility] if persistent ruq pain.  She can follow up with PCP for adrenal adenoma.

## 2016-11-21 NOTE — Progress Notes (Signed)
Pt is aware.  

## 2016-11-24 ENCOUNTER — Encounter (HOSPITAL_COMMUNITY): Payer: Self-pay | Admitting: Emergency Medicine

## 2016-11-24 ENCOUNTER — Observation Stay (HOSPITAL_COMMUNITY)
Admission: EM | Admit: 2016-11-24 | Discharge: 2016-11-25 | Disposition: A | Discharge status: Home / Self Care | Payer: Medicare Other | Attending: Internal Medicine | Admitting: Internal Medicine

## 2016-11-24 DIAGNOSIS — Z7982 Long term (current) use of aspirin: Secondary | ICD-10-CM | POA: Diagnosis not present

## 2016-11-24 DIAGNOSIS — F17210 Nicotine dependence, cigarettes, uncomplicated: Secondary | ICD-10-CM | POA: Insufficient documentation

## 2016-11-24 DIAGNOSIS — E119 Type 2 diabetes mellitus without complications: Secondary | ICD-10-CM

## 2016-11-24 DIAGNOSIS — Z9889 Other specified postprocedural states: Secondary | ICD-10-CM | POA: Diagnosis not present

## 2016-11-24 DIAGNOSIS — Z79899 Other long term (current) drug therapy: Secondary | ICD-10-CM | POA: Diagnosis not present

## 2016-11-24 DIAGNOSIS — I10 Essential (primary) hypertension: Secondary | ICD-10-CM | POA: Diagnosis not present

## 2016-11-24 DIAGNOSIS — Z7984 Long term (current) use of oral hypoglycemic drugs: Secondary | ICD-10-CM | POA: Diagnosis not present

## 2016-11-24 DIAGNOSIS — R55 Syncope and collapse: Principal | ICD-10-CM | POA: Diagnosis present

## 2016-11-24 LAB — CBC WITH DIFFERENTIAL/PLATELET
Basophils Absolute: 0 10*3/uL (ref 0.0–0.1)
Basophils Relative: 0 %
Eosinophils Absolute: 0 10*3/uL (ref 0.0–0.7)
Eosinophils Relative: 0 %
HCT: 45.3 % (ref 36.0–46.0)
Hemoglobin: 15.5 g/dL — ABNORMAL HIGH (ref 12.0–15.0)
Lymphocytes Relative: 7 %
Lymphs Abs: 1 10*3/uL (ref 0.7–4.0)
MCH: 30.5 pg (ref 26.0–34.0)
MCHC: 34.2 g/dL (ref 30.0–36.0)
MCV: 89.2 fL (ref 78.0–100.0)
Monocytes Absolute: 0.7 10*3/uL (ref 0.1–1.0)
Monocytes Relative: 5 %
Neutro Abs: 12.1 10*3/uL — ABNORMAL HIGH (ref 1.7–7.7)
Neutrophils Relative %: 88 %
Platelets: 243 10*3/uL (ref 150–400)
RBC: 5.08 MIL/uL (ref 3.87–5.11)
RDW: 13.9 % (ref 11.5–15.5)
WBC: 13.8 10*3/uL — ABNORMAL HIGH (ref 4.0–10.5)

## 2016-11-24 LAB — COMPREHENSIVE METABOLIC PANEL
ALT: 24 U/L (ref 14–54)
AST: 25 U/L (ref 15–41)
Albumin: 4 g/dL (ref 3.5–5.0)
Alkaline Phosphatase: 76 U/L (ref 38–126)
Anion gap: 9 (ref 5–15)
BUN: 14 mg/dL (ref 6–20)
CO2: 26 mmol/L (ref 22–32)
Calcium: 9.2 mg/dL (ref 8.9–10.3)
Chloride: 102 mmol/L (ref 101–111)
Creatinine, Ser: 0.63 mg/dL (ref 0.44–1.00)
GFR calc Af Amer: >60 mL/min (ref >60)
GFR calc non Af Amer: >60 mL/min (ref >60)
Glucose, Bld: 159 mg/dL — ABNORMAL HIGH (ref 65–99)
Potassium: 3.7 mmol/L (ref 3.5–5.1)
Sodium: 137 mmol/L (ref 135–145)
Total Bilirubin: 0.4 mg/dL (ref 0.3–1.2)
Total Protein: 7.2 g/dL (ref 6.5–8.1)

## 2016-11-24 LAB — TROPONIN I: Troponin I: <0.03 ng/mL (ref <0.03)

## 2016-11-24 MED ORDER — ONDANSETRON HCL 4 MG/2ML IJ SOLN
4.0000 mg | Freq: Four times a day (QID) | INTRAMUSCULAR | Status: DC | PRN
Start: 2016-11-24 — End: 2016-11-25

## 2016-11-24 MED ORDER — ONDANSETRON HCL 4 MG/2ML IJ SOLN
4.0000 mg | Freq: Once | INTRAMUSCULAR | Status: AC
  Administered 2016-11-24: 4 mg via INTRAVENOUS
  Filled 2016-11-24: qty 2

## 2016-11-24 MED ORDER — SODIUM CHLORIDE 0.9% FLUSH
3.0000 mL | Freq: Two times a day (BID) | INTRAVENOUS | Status: DC
  Administered 2016-11-24: 3 mL via INTRAVENOUS

## 2016-11-24 MED ORDER — ONDANSETRON HCL 4 MG PO TABS: 4.0000 mg | ORAL_TABLET | Freq: Four times a day (QID) | ORAL | Status: DC | PRN

## 2016-11-24 MED ORDER — OXYCODONE-ACETAMINOPHEN 5-325 MG PO TABS: 1.0000 | ORAL_TABLET | ORAL | Status: DC | PRN

## 2016-11-24 MED ORDER — ACETAMINOPHEN 325 MG PO TABS
650.0000 mg | ORAL_TABLET | Freq: Four times a day (QID) | ORAL | Status: DC | PRN
  Administered 2016-11-25: 650 mg via ORAL
  Filled 2016-11-24: qty 2

## 2016-11-24 MED ORDER — SODIUM CHLORIDE 0.9 % IV BOLUS (SEPSIS)
1000.0000 mL | Freq: Once | INTRAVENOUS | Status: AC
  Administered 2016-11-24: 1000 mL via INTRAVENOUS

## 2016-11-24 MED ORDER — LACTATED RINGERS IV SOLN
INTRAVENOUS | Status: DC
  Administered 2016-11-24: via INTRAVENOUS

## 2016-11-24 MED ORDER — ENOXAPARIN SODIUM 40 MG/0.4ML ~~LOC~~ SOLN
40.0000 mg | SUBCUTANEOUS | Status: DC
  Administered 2016-11-25: 40 mg via SUBCUTANEOUS
  Filled 2016-11-24: qty 0.4

## 2016-11-24 MED ORDER — INSULIN ASPART 100 UNIT/ML ~~LOC~~ SOLN: 0.0000 [IU] | Freq: Three times a day (TID) | SUBCUTANEOUS | Status: DC

## 2016-11-24 MED ORDER — ASPIRIN EC 81 MG PO TBEC
81.0000 mg | DELAYED_RELEASE_TABLET | Freq: Every morning | ORAL | Status: DC
  Administered 2016-11-25: 81 mg via ORAL
  Filled 2016-11-24: qty 1

## 2016-11-24 MED ORDER — ACETAMINOPHEN 650 MG RE SUPP: 650.0000 mg | Freq: Four times a day (QID) | RECTAL | Status: DC | PRN

## 2016-11-24 MED ORDER — ATORVASTATIN CALCIUM 40 MG PO TABS: 40.0000 mg | ORAL_TABLET | Freq: Every evening | ORAL | Status: DC

## 2016-11-24 MED ORDER — ENALAPRIL MALEATE 5 MG PO TABS
10.0000 mg | ORAL_TABLET | Freq: Two times a day (BID) | ORAL | Status: DC
  Administered 2016-11-25: 10 mg via ORAL
  Filled 2016-11-24: qty 2

## 2016-11-24 MED ORDER — CYCLOBENZAPRINE HCL 10 MG PO TABS: 10.0000 mg | ORAL_TABLET | Freq: Two times a day (BID) | ORAL | Status: DC | PRN

## 2016-11-24 MED ORDER — PANTOPRAZOLE SODIUM 40 MG IV SOLR
40.0000 mg | Freq: Once | INTRAVENOUS | Status: AC
  Administered 2016-11-25: 40 mg via INTRAVENOUS
  Filled 2016-11-24: qty 40

## 2016-11-24 NOTE — ED Provider Notes (Signed)
[facility] DEPT Provider Note   CSN: [id number] Arrival date & time: 11/24/16  1955 By signing my name below, I, Courtney Dorsey, attest that this documentation has been prepared under the direction and in the presence of Courtney Manifold, MD. Electronically Signed: Georgette Dorsey, ED Scribe. 11/24/16. 8:39 PM.  History   Chief Complaint Chief Complaint  Patient presents with  . Loss of Consciousness    HPI The history is provided by the patient. No language interpreter was used.   HPI Comments: Courtney Dorsey is a 71 y.o. female with h/o anemia, diverticulosis, DM, and HTN, who presents to the Emergency Department for evaluation following three syncopal episodes since 5pm today. Pt reports she's "nauseous" just prior to her syncope. She states has been dry heaving for several days and immediately started dry heaving immediately after her syncopal episodes. Pt states her daughter has witnessed her most recent episode (just PTA) and notes that she was in syncope for about 10-15 seconds. She has a headache secondary to her last syncopal fall. Husband at bedside reports that pt has had these episodes prior and has been seen for this. Pt has an MRI and CT scan that were all unremarkable. Pt denies fever, chills, diaphoresis, chest pain, or any other associated symptoms   PCP: Courtney Kilts, MD  Past Medical History:  Diagnosis Date  . Anemia   . Diabetes mellitus without complication ([place])    "BORDERLINE"  . Diverticulosis   . Heart murmur   . History of hiatal hernia   . History of skin cancer   . History of transfusion   . Hypercholesteremia   . Hypertension   . UTI (lower urinary tract infection)    TAKING ANTIBIOTICS    Patient Active Problem List   Diagnosis Date Noted  . Acute right flank pain 11/07/2016  . Constipation 11/07/2016  . Abdominal wall bulge 11/07/2016  . Status post laparoscopic Nissen fundoplication and repair of large type III hiatal hernia Feb 2017  10/10/2015  . IDA (iron deficiency anemia) 01/29/2013  . Microcytic anemia 12/31/2012  . Diabetes mellitus type 2, controlled ([place]) 12/31/2012    Past Surgical History:  Procedure Laterality Date  . CHOLECYSTECTOMY    . COLONOSCOPY  2009   Courtney Dorsey  . COLONOSCOPY N/A 01/01/2013   WUJ:WJXBJY mucosa in the terminal ileum  Mild diverticulosis in the descending colon and sigmoid colon and sigmoid colon/Moderate sized internal hemorrhoids  . ESOPHAGOGASTRODUODENOSCOPY N/A 01/01/2013   NWG:NFAOZ hiatal hernia/ MODERATE SIZE PARA-ESOPHAGEAL HERNIA/MILD Erosive gastritis. chronic duodenitis c/w peptic duodenitis. no h.pylori or villous atrophy. minimal chronic gastritis.   Marland Kitchen ESOPHAGOGASTRODUODENOSCOPY N/A 02/16/2013   Procedure: ESOPHAGOGASTRODUODENOSCOPY (EGD);  Surgeon: Courtney Dolin, MD;  [facility];  Service: Endoscopy;  Laterality: N/A;  8:30  . GIVENS CAPSULE STUDY N/A 02/16/2013   EGD with capsule placement. no evidence of paraesophageal hernia. SB essentially unremarkable.  Marland Kitchen KNOT REMOVED FROM SCALP    . LAPAROSCOPIC NISSEN FUNDOPLICATION N/A 3/0/8657   Procedure: LAPAROSCOPIC NISSEN FUNDOPLICATION AND HIATAL HERNIA REPAIR;  Surgeon: Courtney Hausen, MD;  Location: [facility] ORS;  Service: General;  Laterality: N/A;    OB History    No data available       Home Medications    Prior to Admission medications   Medication Sig Start Date End Date Taking? Authorizing Provider  aspirin 81 MG tablet Take 81 mg by mouth every morning.     Historical Provider, MD  atorvastatin (LIPITOR) 40 MG  tablet Take 40 mg by mouth every evening.     Historical Provider, MD  cyclobenzaprine (FLEXERIL) 10 MG tablet Take 1 tablet (10 mg total) by mouth 2 (two) times daily as needed for muscle spasms. 10/23/16   Courtney Greek, MD  enalapril (VASOTEC) 10 MG tablet Take 10 mg by mouth 2 (two) times daily.    Historical Provider, MD  ketoconazole (NIZORAL) 2 % cream Apply 1 application  topically daily as needed for irritation (behind ear.).    Historical Provider, MD  lubiprostone (AMITIZA) 24 MCG capsule Take 1 capsule (24 mcg total) by mouth 2 (two) times daily with a meal. 11/01/16   Danie Binder, MD  metFORMIN (GLUCOPHAGE) 500 MG tablet Take 250 mg by mouth 2 (two) times daily with a meal.    Historical Provider, MD  oxyCODONE-acetaminophen (PERCOCET/ROXICET) 5-325 MG tablet Take 1 tablet by mouth every 4 (four) hours as needed for moderate pain or severe pain.  10/25/16   Historical Provider, MD  ranitidine (ZANTAC) 150 MG tablet Take 150-300 mg by mouth 2 (two) times daily as needed for heartburn.    Historical Provider, MD  Simethicone (GAS-X PO) Take 1-2 tablets by mouth 2 (two) times daily as needed (stomach irritation).    Historical Provider, MD  traMADol (ULTRAM) 50 MG tablet Take 1 tablet (50 mg total) by mouth every 6 (six) hours as needed. Patient not taking: Reported on 10/28/2016 10/23/16   Courtney Greek, MD    Family History Family History  Problem Relation Age of Onset  . Colon cancer Mother     diagnosed around age 35  . Colon cancer Brother     age 51  . Liver disease Neg Hx   . Lung cancer Neg Hx   . Breast cancer Neg Hx   . Ovarian cancer Neg Hx   . Celiac disease Neg Hx     Social History Social History  Substance Use Topics  . Smoking status: Current Every Day Smoker  . Smokeless tobacco: Never Used     Comment: trying to quit, on the patch  . Alcohol use No     Allergies   Patient has no known allergies.   Review of Systems Review of Systems  Constitutional: Negative for chills, diaphoresis and fever.  Cardiovascular: Negative for chest pain.  Neurological: Positive for syncope and headaches.  All other systems reviewed and are negative.    Physical Exam Updated Vital Dorsey BP (!) 161/86 (BP Location: Left Arm)   Pulse (!) 102   Temp 99.3 F (37.4 C) (Temporal)   Resp 20   Ht 5\' 1"  (1.549 m)   Wt 140 lb (63.5 kg)    SpO2 96%   BMI 26.45 kg/m   Physical Exam  Constitutional: She is oriented to person, place, and time. She appears well-developed and well-nourished. No distress.  HENT:  Head: Normocephalic and atraumatic.  Nose: Nose normal.  Mouth/Throat: Oropharynx is clear and moist. No oropharyngeal exudate.  Eyes: Conjunctivae and EOM are normal. Pupils are equal, round, and reactive to light. No scleral icterus.  Neck: Normal range of motion. Neck supple. No JVD present. No tracheal deviation present. No thyromegaly present.  Cardiovascular: Regular rhythm and normal heart sounds.  Tachycardia present.  Exam reveals no gallop and no friction rub.   No murmur heard. Mild tachycardia  Pulmonary/Chest: Effort normal and breath sounds normal. No respiratory distress. She has no wheezes. She exhibits no tenderness.  Abdominal: Soft. Bowel sounds  are normal. She exhibits no distension and no mass. There is no tenderness. There is no rebound and no guarding. A hernia is present.  Right abdominal wall hernia that self-reduces. Non-tender.  Musculoskeletal: Normal range of motion. She exhibits no edema or tenderness.  Lymphadenopathy:    She has no cervical adenopathy.  Neurological: She is alert and oriented to person, place, and time. No cranial nerve deficit. She exhibits normal muscle tone.  Skin: Skin is warm and dry. No rash noted. No erythema. No pallor.  Nursing note and vitals reviewed.    ED Treatments / Results  DIAGNOSTIC STUDIES: Oxygen Saturation is 96% on RA, adequate by my interpretation.   COORDINATION OF CARE: 8:39 PM-Discussed next steps with pt. Pt verbalized understanding and is agreeable with the plan.   Labs (all labs ordered are listed, but only abnormal results are displayed) Labs Reviewed  CBC WITH DIFFERENTIAL/PLATELET - Abnormal; Notable for the following:       Result Value   WBC 13.8 (*)    Hemoglobin 15.5 (*)    Neutro Abs 12.1 (*)    All other components  within normal limits  COMPREHENSIVE METABOLIC PANEL - Abnormal; Notable for the following:    Glucose, Bld 159 (*)    All other components within normal limits  TROPONIN I    EKG  EKG Interpretation  Date/Time:  Sunday November 24 2016 20:22:23 EDT Ventricular Rate:  98 PR Interval:    QRS Duration: 88 QT Interval:  351 QTC Calculation: 449 R Axis:   14 Text Interpretation:  Sinus rhythm Anteroseptal infarct, old Baseline wander in lead(s) V3 No significant change since last tracing Confirmed by Wilson Singer  MD, Pamala Hayman [id number]) on 11/24/2016 10:00:06 PM       Radiology No results found.  Procedures Procedures (including critical care time)  Medications Ordered in ED Medications - No data to display   Initial Impression / Assessment and Plan / ED Course  I have reviewed the triage vital Dorsey and the nursing notes.  Pertinent labs & imaging results that were available during my care of the patient were reviewed by me and considered in my medical decision making (see chart for details).    70yF with syncope. She reports three episodes of LOC this afternoon/evening. She reports preceding nausea and then dry heaving after she regained consciousness. Increased vagal tone with n/v? No episodes with exertion. Denies any pain.   Final Clinical Impressions(s) / ED Diagnoses   Final diagnoses:  Syncope, unspecified syncope type    New Prescriptions New Prescriptions   No medications on file   I personally preformed the services scribed in my presence. The recorded information has been reviewed is accurate. Courtney Manifold, MD.     Courtney Manifold, MD 11/25/16 [phone]

## 2016-11-24 NOTE — ED Notes (Signed)
Dr. Lorin Mercy in room

## 2016-11-24 NOTE — H&P (Signed)
History and Physical    Courtney Dorsey YBO:[id number] DOB: 10/14/1945 DOA: 11/24/2016  PCP: Purvis Kilts, MD Consultants:  Soyla Murphy - surgery Patient coming from: home - lives with husband; NOK: husband, [phone]  Chief Complaint: syncope  HPI: Courtney Dorsey is a 71 y.o. female with medical history significant of DM, HTN, HLD, and hiatal hernia s/p Nissen fundoplication in 6/14 presenting with syncope.  Patient reports that she passed out 3 times today between 5-7:30.  Has been having dry heaves really bad.  Had Nissen a year ago and unable to vomit since.  Also passed out a few times a couple of weeks ago.  Was not seen at that time.  Has been having ongoing GI issues - 4-6 weeks now.  Also with a bulge on her belly, no recent stools.  Bulge is worse when she is upright.  Dry heaves, nausea.  Wants to see her surgeon to talk about the dry heaves and what can be done.  Awoke in the middle of the night Friday for these issues.  Got up at 730 this AM with the same issue.  Pharmacist suggested Emetrol for the vomiting and that hasn't helped.  With the passing out, got very swimmy headed and nauseated.  When she came to, she was dry heaving badly.  The next time, she was in a chair and she had passed out and again dry heaving when she came to.  The last time she did not dry heave upon awakening.   ED Course: Syncope - thought to be increased vagal tone with n/v  Review of Systems: As per HPI; otherwise 10 point review of systems reviewed and negative.   Ambulatory Status:   ambulates without assistance.  Past Medical History:  Diagnosis Date  . Anemia   . Diabetes mellitus without complication ([place])    "BORDERLINE"  . Diverticulosis   . Heart murmur   . History of hiatal hernia   . History of skin cancer   . History of transfusion   . Hypercholesteremia   . Hypertension   . UTI (lower urinary tract infection)    TAKING ANTIBIOTICS    Past Surgical History:    Procedure Laterality Date  . CHOLECYSTECTOMY    . COLONOSCOPY  2009   Dr. Aviva Signs  . COLONOSCOPY N/A 01/01/2013   ERX:VQMGQQ mucosa in the terminal ileum  Mild diverticulosis in the descending colon and sigmoid colon and sigmoid colon/Moderate sized internal hemorrhoids  . ESOPHAGOGASTRODUODENOSCOPY N/A 01/01/2013   PYP:PJKDT hiatal hernia/ MODERATE SIZE PARA-ESOPHAGEAL HERNIA/MILD Erosive gastritis. chronic duodenitis c/w peptic duodenitis. no h.pylori or villous atrophy. minimal chronic gastritis.   Marland Kitchen ESOPHAGOGASTRODUODENOSCOPY N/A 02/16/2013   Procedure: ESOPHAGOGASTRODUODENOSCOPY (EGD);  Surgeon: Daneil Dolin, MD;  Location[facility];  Service: Endoscopy;  Laterality: N/A;  8:30  . GIVENS CAPSULE STUDY N/A 02/16/2013   EGD with capsule placement. no evidence of paraesophageal hernia. SB essentially unremarkable.  Marland Kitchen KNOT REMOVED FROM SCALP    . LAPAROSCOPIC NISSEN FUNDOPLICATION N/A 10/08/7122   Procedure: LAPAROSCOPIC NISSEN FUNDOPLICATION AND HIATAL HERNIA REPAIR;  Surgeon: Johnathan Hausen, MD;  Location: [facility];  Service: General;  Laterality: N/A;    Social History   Social History  . Marital status: Married    Spouse name: N/A  . Number of children: 1  . Years of education: N/A   Occupational History  .  Retired   Social History Main Topics  . Smoking status: Current Every Day  Smoker    Packs/day: 1.00    Years: 28.00    Types: Cigarettes  . Smokeless tobacco: Never Used  . Alcohol use No  . Drug use: No  . Sexual activity: Yes    Birth control/ protection: Post-menopausal   Other Topics Concern  . Not on file   Social History Narrative  . No narrative on file    No Known Allergies  Family History  Problem Relation Age of Onset  . Colon cancer Mother     diagnosed around age 59  . Colon cancer Brother     age 5  . Liver disease Neg Hx   . Lung cancer Neg Hx   . Breast cancer Neg Hx   . Ovarian cancer Neg Hx   . Celiac disease Neg Hx     Prior  to Admission medications   Medication Sig Start Date End Date Taking? Authorizing Provider  aspirin 81 MG tablet Take 81 mg by mouth every morning.     Historical Provider, MD  atorvastatin (LIPITOR) 40 MG tablet Take 40 mg by mouth every evening.     Historical Provider, MD  cyclobenzaprine (FLEXERIL) 10 MG tablet Take 1 tablet (10 mg total) by mouth 2 (two) times daily as needed for muscle spasms. 10/23/16   Orpah Greek, MD  enalapril (VASOTEC) 10 MG tablet Take 10 mg by mouth 2 (two) times daily.    Historical Provider, MD  ketoconazole (NIZORAL) 2 % cream Apply 1 application topically daily as needed for irritation (behind ear.).    Historical Provider, MD  lubiprostone (AMITIZA) 24 MCG capsule Take 1 capsule (24 mcg total) by mouth 2 (two) times daily with a meal. 11/01/16   Danie Binder, MD  metFORMIN (GLUCOPHAGE) 500 MG tablet Take 250 mg by mouth 2 (two) times daily with a meal.    Historical Provider, MD  oxyCODONE-acetaminophen (PERCOCET/ROXICET) 5-325 MG tablet Take 1 tablet by mouth every 4 (four) hours as needed for moderate pain or severe pain.  10/25/16   Historical Provider, MD  ranitidine (ZANTAC) 150 MG tablet Take 150-300 mg by mouth 2 (two) times daily as needed for heartburn.    Historical Provider, MD  Simethicone (GAS-X PO) Take 1-2 tablets by mouth 2 (two) times daily as needed (stomach irritation).    Historical Provider, MD  traMADol (ULTRAM) 50 MG tablet Take 1 tablet (50 mg total) by mouth every 6 (six) hours as needed. Patient not taking: Reported on 10/28/2016 10/23/16   Orpah Greek, MD    Physical Exam: Vitals:   11/24/16 2130 11/24/16 2200 11/24/16 2230 11/24/16 2245  BP: (!) 142/70 126/63 138/71   Pulse: 80 82 93 90  Resp: 11 20 12 18   Temp:      TempSrc:      SpO2: 96% 93% 95% 94%  Weight:      Height:         General:  Appears calm and comfortable and is NAD Eyes:  PERRL, EOMI, normal lids, iris ENT:  grossly normal hearing, lips &  tongue, mmm Neck:  no LAD, masses or thyromegaly Cardiovascular:  RRR, no m/r/g. No LE edema.  Respiratory:  CTA bilaterally, no w/r/r. Normal respiratory effort. Abdomen:  soft, ntnd, NABS Skin:  no rash or induration seen on limited exam Musculoskeletal:  grossly normal tone BUE/BLE, good ROM, no bony abnormality Psychiatric:  grossly normal mood and affect, speech fluent and appropriate, AOx3 Neurologic:  CN 2-12 grossly intact, moves all  extremities in coordinated fashion, sensation intact  Labs on Admission: I have personally reviewed following labs and imaging studies  CBC:  Recent Labs Lab 11/24/16 2026  WBC 13.8*  NEUTROABS 12.1*  HGB 15.5*  HCT 45.3  MCV 89.2  PLT 638   Basic Metabolic Panel:  Recent Labs Lab 11/24/16 2026  NA 137  K 3.7  CL 102  CO2 26  GLUCOSE 159*  BUN 14  CREATININE 0.63  CALCIUM 9.2   GFR: Estimated Creatinine Clearance: 57.5 mL/min (by C-G formula based on SCr of 0.63 mg/dL). Liver Function Tests:  Recent Labs Lab 11/24/16 2026  AST 25  ALT 24  ALKPHOS 76  BILITOT 0.4  PROT 7.2  ALBUMIN 4.0   No results for input(s): LIPASE, AMYLASE in the last 168 hours. No results for input(s): AMMONIA in the last 168 hours. Coagulation Profile: No results for input(s): INR, PROTIME in the last 168 hours. Cardiac Enzymes:  Recent Labs Lab 11/24/16 2026  TROPONINI <0.03   BNP (last 3 results) No results for input(s): PROBNP in the last 8760 hours. HbA1C: No results for input(s): HGBA1C in the last 72 hours. CBG: No results for input(s): GLUCAP in the last 168 hours. Lipid Profile: No results for input(s): CHOL, HDL, LDLCALC, TRIG, CHOLHDL, LDLDIRECT in the last 72 hours. Thyroid Function Tests: No results for input(s): TSH, T4TOTAL, FREET4, T3FREE, THYROIDAB in the last 72 hours. Anemia Panel: No results for input(s): VITAMINB12, FOLATE, FERRITIN, TIBC, IRON, RETICCTPCT in the last 72 hours. Urine analysis:    Component  Value Date/Time   COLORURINE YELLOW 10/28/2016 1929   APPEARANCEUR CLEAR 10/28/2016 1929   LABSPEC 1.009 10/28/2016 1929   PHURINE 5.0 10/28/2016 1929   GLUCOSEU NEGATIVE 10/28/2016 1929   HGBUR SMALL (A) 10/28/2016 [place] NEGATIVE 10/28/2016 [place] NEGATIVE 10/28/2016 1929   PROTEINUR NEGATIVE 10/28/2016 1929   NITRITE NEGATIVE 10/28/2016 1929   LEUKOCYTESUR SMALL (A) 10/28/2016 1929    Creatinine Clearance: Estimated Creatinine Clearance: 57.5 mL/min (by C-G formula based on SCr of 0.63 mg/dL).  Sepsis Labs: @LABRCNTIP (procalcitonin:4,lacticidven:4) )No results found for this or any previous visit (from the past 240 hour(s)).   Radiological Exams on Admission: No results found.  EKG: Independently reviewed.  NSR with rate 98,  nonspecific ST changes with no evidence of acute ischemia, NSCSLT  Assessment/Plan Principal Problem:   Syncope Active Problems:   Diabetes mellitus type 2, controlled ([facility])   Status post laparoscopic Nissen fundoplication and repair of large type III hiatal hernia Feb 2017   Syncope -Etiology appears to most likely be vasovagal syncope associated with her ongoing GI issues/retching. -By the [facility] syncope rule, the patient is at low risk for serious outcome. -Will monitor overnight on telemetry -Orthostatic vital signs in AM -Neuro checks  -Elevated WBC count is likely reactive; will recheck in the AM. -If no further issues overnight, anticipate discharge in the AM.  s/p Nissen due to large hiatal hernia -Patient now with recurrent moderate-large hiatal hernia -This appears to be the source of her recent GI distress (2 prior ER visits plus tonight) -Needs ASAP surgical consultation -We discussed the option of transferring to [facility] for AM inpatient evaluation, but outpatient evaluation is a reasonable option -Will ask the day team to facilitate an ASAP surgical consult with Dr.Martin/[facility], as she is  likely to continue to return for ER visits and hospitalizations until the issue has been addressed.  DM -Glucose 159 -Hold Glucophage -Check  A1c -Cover with SSI   DVT prophylaxis:Lovenox  Code Status: Full - confirmed with patient/family Family Communication: Husband and another friend/family member present throughout Disposition Plan:  Home once clinically improved Consults called: None  Admission status: It is my clinical opinion that referral for OBSERVATION is reasonable and necessary in this patient based on the above information provided. The aforementioned taken together are felt to place the patient at high risk for further clinical deterioration. However it is anticipated that the patient may be medically stable for discharge from the hospital within 24 to 48 hours.    Karmen Bongo MD [facility]  If 7PM-7AM, please contact night-coverage [email or web address] Password [facility]  11/24/2016, 10:58 PM

## 2016-11-24 NOTE — ED Triage Notes (Signed)
Pt is has been having dry heaves for several days. Pt states she can not vomit due to surgery she had last year. Pt has had 3 episodes of passing out since 5Pm today. She comes to with dry heaves. Pt has knot on the back of her head from last fall

## 2016-11-25 DIAGNOSIS — R55 Syncope and collapse: Secondary | ICD-10-CM | POA: Diagnosis not present

## 2016-11-25 LAB — CBC
HCT: 42.5 % (ref 36.0–46.0)
Hemoglobin: 14.3 g/dL (ref 12.0–15.0)
MCH: 30 pg (ref 26.0–34.0)
MCHC: 33.6 g/dL (ref 30.0–36.0)
MCV: 89.3 fL (ref 78.0–100.0)
Platelets: 236 10*3/uL (ref 150–400)
RBC: 4.76 MIL/uL (ref 3.87–5.11)
RDW: 14 % (ref 11.5–15.5)
WBC: 11.1 10*3/uL — ABNORMAL HIGH (ref 4.0–10.5)

## 2016-11-25 LAB — BASIC METABOLIC PANEL
Anion gap: 7 (ref 5–15)
BUN: 10 mg/dL (ref 6–20)
CALCIUM: 8.7 mg/dL — AB (ref 8.9–10.3)
CO2: 27 mmol/L (ref 22–32)
Chloride: 107 mmol/L (ref 101–111)
Creatinine, Ser: 0.59 mg/dL (ref 0.44–1.00)
GFR calc Af Amer: >60 mL/min (ref >60)
GLUCOSE: 104 mg/dL — AB (ref 65–99)
Potassium: 3.6 mmol/L (ref 3.5–5.1)
Sodium: 141 mmol/L (ref 135–145)

## 2016-11-25 LAB — GLUCOSE, CAPILLARY
GLUCOSE-CAPILLARY: 94 mg/dL (ref 65–99)
GLUCOSE-CAPILLARY: 97 mg/dL (ref 65–99)

## 2016-11-25 NOTE — Discharge Summary (Signed)
Physician Discharge Summary  ARIS MOMAN TFT:[id number] DOB: October 01, 1945 DOA: 11/24/2016  PCP: Purvis Kilts, MD  Admit date: 11/24/2016 Discharge date: 11/25/2016  Time spent: 45 minutes  Recommendations for Outpatient Follow-up:  -Will be discharged home today. -Advised to follow up with PCP in 2 weeks.   Discharge Diagnoses:  Principal Problem:   Syncope Active Problems:   Diabetes mellitus type 2, controlled ([place])   Status post laparoscopic Nissen fundoplication and repair of large type III hiatal hernia Feb 2017   Discharge Condition: Stable and improved  Filed Weights   11/24/16 2007 11/24/16 2309 11/25/16 0436  Weight: 63.5 kg (140 lb) 65.8 kg (145 lb) 66 kg (145 lb 9.6 oz)    History of present illness:  As per Dr. Lorin Mercy on 3/25:  Courtney Dorsey is a 71 y.o. female with medical history significant of DM, HTN, HLD, and hiatal hernia s/p Nissen fundoplication in 7/06 presenting with syncope.  Patient reports that she passed out 3 times today between 5-7:30.  Has been having dry heaves really bad.  Had Nissen a year ago and unable to vomit since.  Also passed out a few times a couple of weeks ago.  Was not seen at that time.  Has been having ongoing GI issues - 4-6 weeks now.  Also with a bulge on her belly, no recent stools.  Bulge is worse when she is upright.  Dry heaves, nausea.  Wants to see her surgeon to talk about the dry heaves and what can be done.  Awoke in the middle of the night Friday for these issues.  Got up at 730 this AM with the same issue.  Pharmacist suggested Emetrol for the vomiting and that hasn't helped.  With the passing out, got very swimmy headed and nauseated.  When she came to, she was dry heaving badly.  The next time, she was in a chair and she had passed out and again dry heaving when she came to.  The last time she did not dry heave upon awakening.   ED Course: Syncope - thought to be increased vagal tone with n/v  Hospital  Course:   Syncope -Etiology unknown, altho likely vasovagal. -No further w/u required at present.  N/V -Likely due to [facility] with severe GERD. -Would like to see Dr. Hassell Done with [facility] who had performed her prior fundoplication. -Continue ranitidine.  Procedures:  None   Consultations:  None  Discharge Instructions  Discharge Instructions    Diet - low sodium heart healthy    Complete by:  As directed    Increase activity slowly    Complete by:  As directed      Allergies as of 11/25/2016   No Known Allergies     Medication List    STOP taking these medications   cyclobenzaprine 10 MG tablet Commonly known as:  FLEXERIL   traMADol 50 MG tablet Commonly known as:  ULTRAM     TAKE these medications   aspirin 81 MG tablet Take 81 mg by mouth every morning.   atorvastatin 40 MG tablet Commonly known as:  LIPITOR Take 40 mg by mouth every evening.   enalapril 10 MG tablet Commonly known as:  VASOTEC Take 10 mg by mouth 2 (two) times daily.   GAS-X PO Take 1-2 tablets by mouth 2 (two) times daily as needed (stomach irritation).   ketoconazole 2 % cream Commonly known as:  NIZORAL Apply 1 application topically daily as needed for  irritation (behind ear.).   lubiprostone 24 MCG capsule Commonly known as:  AMITIZA Take 1 capsule (24 mcg total) by mouth 2 (two) times daily with a meal.   metFORMIN 500 MG tablet Commonly known as:  GLUCOPHAGE Take 250 mg by mouth 2 (two) times daily with a meal.   ranitidine 150 MG tablet Commonly known as:  ZANTAC Take 150-300 mg by mouth 2 (two) times daily as needed for heartburn.      No Known Allergies Follow-up Information    Purvis Kilts, MD. Schedule an appointment as soon as possible for a visit in 2 week(s).   Specialty:  Family Medicine Contact information: [address] [place] [address] [phone]        Clovis Riley, MD Follow up on 12/06/2016.   Specialty:  General Surgery Why:   At 4:15pm [facility]  Contact information: [phone]            The results of significant diagnostics from this hospitalization (including imaging, microbiology, ancillary and laboratory) are listed below for reference.    Significant Diagnostic Studies: Mr Abdomen W Wo Contrast  Result Date: 11/14/2016 CLINICAL DATA:  Evaluate left adrenal mass.  Right renal lesion. EXAM: MRI ABDOMEN WITHOUT AND WITH CONTRAST TECHNIQUE: Multiplanar multisequence MR imaging of the abdomen was performed both before and after the administration of intravenous contrast. CONTRAST:  35mL MULTIHANCE GADOBENATE DIMEGLUMINE 529 MG/ML IV SOLN COMPARISON:  10/29/2016 CT FINDINGS: Lower chest: Normal heart size without pericardial or pleural effusion. Hepatobiliary: Normal liver. Cholecystectomy, without biliary ductal dilatation. Pancreas:  Normal, without mass or ductal dilatation. Spleen:  Normal in size, without focal abnormality. Adrenals/Urinary Tract: Normal right adrenal gland. Left adrenal nodule measures 2.5 x 2.5 cm. This is heterogeneous in T2 signal with central T2 hyperintensity, including on image 9/series 6. On out of phase phase imaging, this demonstrates signal dropout. Post-contrast images demonstrate heterogeneous enhancement. Similar in size back to 10/01/2015. Normal left kidney.  An interpolar 5.4 cm right renal cyst. Posterior interpolar right renal lesion measures 7 mm. Precontrast T1 hyperintense on image 64/ series 11. No post-contrast enhancement. Apparent signal on subtracted images is secondary to motion limitation. No hydronephrosis. Stomach/Bowel: Moderate to large hiatal hernia. Normal abdominal bowel loops. Vascular/Lymphatic: Normal caliber of the aorta and branch vessels. No retroperitoneal or retrocrural adenopathy. Other:  No ascites. Musculoskeletal: No acute osseous abnormality. IMPRESSION: 1. Left adrenal lesion demonstrates signal dropout on out of phase imaging,  consistent with an adenoma. 2. 7 mm right renal complex cyst. 3. Moderate to large hiatal hernia. Electronically Signed   By: Abigail Miyamoto M.D.   On: 11/14/2016 08:56   Ct Abdomen Pelvis W Contrast  Result Date: 10/29/2016 CLINICAL DATA:  Right flank pain for 6 days. Pain radiates to right lower quadrant. EXAM: CT ABDOMEN AND PELVIS WITH CONTRAST TECHNIQUE: Multidetector CT imaging of the abdomen and pelvis was performed using the standard protocol following bolus administration of intravenous contrast. CONTRAST:  100 cc Isovue-300 IV COMPARISON:  Noncontrast CT 6 days prior. CT 10/01/2015 also reviewed FINDINGS: Lower chest: Dependent atelectasis in both lower lobes, right greater than left. No pleural fluid. Moderate-sized hiatal hernia is unchanged from recent prior exam, postsurgical change with surgical staples at the diaphragmatic hiatus. Hepatobiliary: Postcholecystectomy. Biliary prominence, a common finding postcholecystectomy. No calcified stones. No focal hepatic lesion. Pancreas: No ductal dilatation or inflammation. Spleen: Normal in size without focal abnormality. Adrenals/Urinary Tract: Heterogeneous left adrenal mass measuring 3.4 x 2.9 x 2.6  cm contains a punctate calcification. Normal right adrenal gland. No hydronephrosis or perinephric edema. 5.5 cm cyst in the right kidney is unchanged, question peripheral wall calcification. The adjacent exophytic lesion on prior noncontrast CT is not as well visualized currently. Left kidney appears normal. Symmetric excretion on delayed phase imaging. Urinary bladder is minimally distended. Stomach/Bowel: Unchanged moderate size hiatal hernia. Normal appendix. No small bowel dilatation or inflammation. Previous prominent fluid-filled small bowel is now decompressed. Moderate diffuse colonic stool burden. Sigmoid colonic tortuosity colonic diverticular most prominent in the descending and sigmoid colon without acute inflammation. Vascular/Lymphatic: Mild  aortic and branch atherosclerosis. No aneurysm. No adenopathy. Reproductive: Endometrial thickening persists.  No adnexal mass. Other: No free air, free fluid, or intra-abdominal fluid collection. Musculoskeletal: There are no acute or suspicious osseous abnormalities. Stable degenerative change in the spine. IMPRESSION: 1. Moderate stool burden suggests constipation. No bowel obstruction or inflammation. Prominent fluid-filled small bowel on prior exam has resolved. 2. Normal appendix. 3. 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 Nonemergent adrenal protocol MRI again recommended for characterization. 4. Endometrial prominence persists. Nonemergent pelvic ultrasound again recommended for characterization. 5. Moderate hiatal hernia with postsurgical change. 6. Aortic atherosclerosis without aneurysm. Electronically Signed   By: Jeb Levering M.D.   On: 10/29/2016 01:05   Dg Abd 2 Views  Result Date: 11/07/2016 CLINICAL DATA:  Constipation.  Evaluate stool load. EXAM: ABDOMEN - 2 VIEW COMPARISON:  10/29/2016 CT FINDINGS: Minimal stool noted. No evidence for small bowel obstruction. Moderate gaseous distention of the stomach, half herniated through the diaphragm. Gaseous distention balls both parts of the stomach such that gastric obstruction is considered unlikely. Cholecystectomy clips.  Clear lung bases. IMPRESSION: 1. Minimal stool present. 2. Moderate hiatal hernia.  The stomach is moderately gas distended. Electronically Signed   By: Monte Fantasia M.D.   On: 11/07/2016 15:10    Microbiology: No results found for this or any previous visit (from the past 240 hour(s)).   Labs: Basic Metabolic Panel:  Recent Labs Lab 11/24/16 2026 11/25/16 0435  NA 137 141  K 3.7 3.6  CL 102 107  CO2 26 27  GLUCOSE 159* 104*  BUN 14 10  CREATININE 0.63 0.59  CALCIUM 9.2 8.7*   Liver Function Tests:  Recent Labs Lab 11/24/16 2026  AST 25  ALT 24  ALKPHOS 76  BILITOT 0.4  PROT 7.2  ALBUMIN 4.0     No results for input(s): LIPASE, AMYLASE in the last 168 hours. No results for input(s): AMMONIA in the last 168 hours. CBC:  Recent Labs Lab 11/24/16 2026 11/25/16 0435  WBC 13.8* 11.1*  NEUTROABS 12.1*  --   HGB 15.5* 14.3  HCT 45.3 42.5  MCV 89.2 89.3  PLT 243 236   Cardiac Enzymes:  Recent Labs Lab 11/24/16 2026  TROPONINI <0.03   BNP: BNP (last 3 results) No results for input(s): BNP in the last 8760 hours.  ProBNP (last 3 results) No results for input(s): PROBNP in the last 8760 hours.  CBG:  Recent Labs Lab 11/25/16 0811 11/25/16 1144  GLUCAP 94 97       Signed:  [facility] Pager: [phone] 11/25/2016, 6:41 PM

## 2016-11-25 NOTE — Progress Notes (Signed)
Pt refusing bed alarm stating that she can feel when she was going down by being light headed. Pt educated on importance of bed alarm and as well as her being a high fall risk. Pt verbalized understanding.

## 2016-11-25 NOTE — Progress Notes (Signed)
Refusing bed alarm  

## 2016-11-26 LAB — HEMOGLOBIN A1C
Hgb A1c MFr Bld: 6.1 % — ABNORMAL HIGH (ref 4.8–5.6)
Mean Plasma Glucose: 128 mg/dL

## 2016-11-26 NOTE — Progress Notes (Signed)
Discharge instructions given, verbalized understanding, out in stable condition via w/c with staff. 

## 2016-12-06 DIAGNOSIS — K449 Diaphragmatic hernia without obstruction or gangrene: Secondary | ICD-10-CM | POA: Diagnosis not present

## 2016-12-10 ENCOUNTER — Other Ambulatory Visit: Payer: Self-pay | Admitting: Surgery

## 2016-12-10 DIAGNOSIS — K449 Diaphragmatic hernia without obstruction or gangrene: Secondary | ICD-10-CM

## 2016-12-17 ENCOUNTER — Ambulatory Visit
Admission: RE | Admit: 2016-12-17 | Discharge: 2016-12-17 | Disposition: A | Discharge status: Home / Self Care | Payer: Medicare Other | Source: Ambulatory Visit | Attending: Surgery | Admitting: Surgery

## 2016-12-17 DIAGNOSIS — K449 Diaphragmatic hernia without obstruction or gangrene: Secondary | ICD-10-CM | POA: Diagnosis not present

## 2016-12-23 NOTE — Progress Notes (Signed)
REVIEWED-NO ADDITIONAL RECOMMENDATIONS. 

## 2017-01-01 DIAGNOSIS — K449 Diaphragmatic hernia without obstruction or gangrene: Secondary | ICD-10-CM | POA: Diagnosis not present

## 2017-01-29 DIAGNOSIS — E119 Type 2 diabetes mellitus without complications: Secondary | ICD-10-CM | POA: Diagnosis not present

## 2017-01-29 DIAGNOSIS — H17823 Peripheral opacity of cornea, bilateral: Secondary | ICD-10-CM | POA: Diagnosis not present

## 2017-01-29 DIAGNOSIS — H25813 Combined forms of age-related cataract, bilateral: Secondary | ICD-10-CM | POA: Diagnosis not present

## 2017-01-29 DIAGNOSIS — H43811 Vitreous degeneration, right eye: Secondary | ICD-10-CM | POA: Diagnosis not present

## 2017-01-29 DIAGNOSIS — H35372 Puckering of macula, left eye: Secondary | ICD-10-CM | POA: Diagnosis not present

## 2017-02-08 DIAGNOSIS — R404 Transient alteration of awareness: Secondary | ICD-10-CM | POA: Diagnosis not present

## 2017-02-08 DIAGNOSIS — R531 Weakness: Secondary | ICD-10-CM | POA: Diagnosis not present

## 2017-02-10 DIAGNOSIS — Z6827 Body mass index (BMI) 27.0-27.9, adult: Secondary | ICD-10-CM | POA: Diagnosis not present

## 2017-02-10 DIAGNOSIS — E663 Overweight: Secondary | ICD-10-CM | POA: Diagnosis not present

## 2017-02-10 DIAGNOSIS — R55 Syncope and collapse: Secondary | ICD-10-CM | POA: Diagnosis not present

## 2017-02-17 ENCOUNTER — Encounter: Payer: Self-pay | Admitting: Cardiovascular Disease

## 2017-02-18 DIAGNOSIS — E785 Hyperlipidemia, unspecified: Secondary | ICD-10-CM | POA: Diagnosis not present

## 2017-02-18 DIAGNOSIS — Z6827 Body mass index (BMI) 27.0-27.9, adult: Secondary | ICD-10-CM | POA: Diagnosis not present

## 2017-02-18 DIAGNOSIS — E236 Other disorders of pituitary gland: Secondary | ICD-10-CM | POA: Diagnosis not present

## 2017-02-18 DIAGNOSIS — I6521 Occlusion and stenosis of right carotid artery: Secondary | ICD-10-CM | POA: Diagnosis not present

## 2017-02-18 DIAGNOSIS — I6782 Cerebral ischemia: Secondary | ICD-10-CM | POA: Diagnosis not present

## 2017-02-18 DIAGNOSIS — R55 Syncope and collapse: Secondary | ICD-10-CM | POA: Diagnosis not present

## 2017-02-18 DIAGNOSIS — E119 Type 2 diabetes mellitus without complications: Secondary | ICD-10-CM | POA: Diagnosis not present

## 2017-02-18 DIAGNOSIS — I10 Essential (primary) hypertension: Secondary | ICD-10-CM | POA: Diagnosis not present

## 2017-02-18 DIAGNOSIS — I6523 Occlusion and stenosis of bilateral carotid arteries: Secondary | ICD-10-CM | POA: Diagnosis not present

## 2017-02-26 NOTE — Progress Notes (Signed)
Cardiology Office Note   Date:  02/27/2017   ID:  Courtney Dorsey, DOB 1945-09-30, Courtney Dorsey  PCP:  Sharilyn Sites, MD  Cardiologist:   Jenkins Rouge, MD   No chief complaint on file.     History of Present Illness: Courtney Dorsey is a 71 y.o. female who presents for consultation regarding syncope. Referred by Dr [facility].  In March seen in ER for "black out" Noted recurrence 6/9 and twice on 6/10. BP noted 95/57 and pulse 44 patient used her own cuff. After a few minutes BP 147/78 and pulse 78 Has family history of seizures. Sitting during blackouts Associated with dry heaves Previous fundoplication and cannot vomit. Needs surgical revision. No associated chest pain palpitations dyspnea diaphoresis. Did not injure self No post ictal state or confusion.  She indicates first episode from dehydration and dry heaves 2nd episode she was raising hands over head to put something on shelf  No chest pain palpitations diaphoresis or dyspnea      Past Medical History:  Diagnosis Date  . Anemia   . Diabetes mellitus without complication ([place])    "BORDERLINE"  . Diverticulosis   . Heart murmur   . History of hiatal hernia   . History of skin cancer   . History of transfusion   . Hypercholesteremia   . Hypertension   . UTI (lower urinary tract infection)    TAKING ANTIBIOTICS    Past Surgical History:  Procedure Laterality Date  . CHOLECYSTECTOMY    . COLONOSCOPY  2009   Dr. Aviva Signs  . COLONOSCOPY N/A 01/01/2013   SWN:IOEVOJ mucosa in the terminal ileum  Mild diverticulosis in the descending colon and sigmoid colon and sigmoid colon/Moderate sized internal hemorrhoids  . ESOPHAGOGASTRODUODENOSCOPY N/A 01/01/2013   JKK:XFGHW hiatal hernia/ MODERATE SIZE PARA-ESOPHAGEAL HERNIA/MILD Erosive gastritis. chronic duodenitis c/w peptic duodenitis. no h.pylori or villous atrophy. minimal chronic gastritis.   Marland Kitchen ESOPHAGOGASTRODUODENOSCOPY N/A 02/16/2013   Procedure:  ESOPHAGOGASTRODUODENOSCOPY (EGD);  Surgeon: Daneil Dolin, MD;  Location: [facility] SUITE;  Service: Endoscopy;  Laterality: N/A;  8:30  . GIVENS CAPSULE STUDY N/A 02/16/2013   EGD with capsule placement. no evidence of paraesophageal hernia. SB essentially unremarkable.  Marland Kitchen KNOT REMOVED FROM SCALP    . LAPAROSCOPIC NISSEN FUNDOPLICATION N/A 10/11/9369   Procedure: LAPAROSCOPIC NISSEN FUNDOPLICATION AND HIATAL HERNIA REPAIR;  Surgeon: Johnathan Hausen, MD;  Location: [facility] ORS;  Service: General;  Laterality: N/A;     Current Outpatient Prescriptions  Medication Sig Dispense Refill  . aspirin 81 MG tablet Take 81 mg by mouth every morning.     Marland Kitchen atorvastatin (LIPITOR) 40 MG tablet Take 40 mg by mouth every evening.     . enalapril (VASOTEC) 10 MG tablet Take 10 mg by mouth 2 (two) times daily.    Marland Kitchen ketoconazole (NIZORAL) 2 % cream Apply 1 application topically daily as needed for irritation (behind ear.).    Marland Kitchen metFORMIN (GLUCOPHAGE) 500 MG tablet Take 250 mg by mouth 2 (two) times daily with a meal.    . ranitidine (ZANTAC) 150 MG tablet Take 150-300 mg by mouth 2 (two) times daily as needed for heartburn.    . Simethicone (GAS-X PO) Take 1-2 tablets by mouth 2 (two) times daily as needed (stomach irritation).     No current facility-administered medications for this visit.     Allergies:   Patient has no known allergies.    Social History:  The patient  reports  that she has been smoking Cigarettes.  She has a 28.00 pack-year smoking history. She has never used smokeless tobacco. She reports that she does not drink alcohol or use drugs.   Family History:  The patient's family history includes Colon cancer in her brother and mother; Diabetes in her brother and sister.    ROS:  Please see the history of present illness.   Otherwise, review of systems are positive for none.   All other systems are reviewed and negative.    PHYSICAL EXAM: VS:  BP (!) 142/72 (BP Location: Right Arm)   Pulse (!)  102   Ht 5\' 2"  (1.575 m)   Wt 65.8 kg (145 lb)   SpO2 97%   BMI 26.52 kg/m  , BMI Body mass index is 26.52 kg/m. Affect appropriate Healthy:  appears stated age 67: normal Neck supple with no adenopathy JVP normal no bruits no thyromegaly Lungs clear with no wheezing and good diaphragmatic motion Heart:  S1/S2SEM radiating to base of right carotid   no rub, gallop or click PMI normal Abdomen: benighn, BS positve, no tenderness, no AAA no bruit.  No HSM or HJR Distal pulses intact with no bruits No edema Neuro non-focal Skin warm and dry No muscular weakness    EKG:  SR septal infarct otherwise normal    Recent Labs: 11/24/2016: ALT 24 11/25/2016: BUN 10; Creatinine, Ser 0.59; Hemoglobin 14.3; Platelets 236; Potassium 3.6; Sodium 141    Lipid Panel No results found for: CHOL, TRIG, HDL, CHOLHDL, VLDL, LDLCALC, LDLDIRECT    Wt Readings from Last 3 Encounters:  02/27/17 65.8 kg (145 lb)  11/25/16 66 kg (145 lb 9.6 oz)  11/07/16 64 kg (141 lb 3.2 oz)      Other studies Reviewed: Additional studies/ records that were reviewed today include: Notes from primary ECG and ER visit  With labs .    ASSESSMENT AND PLAN:  1.  Syncope doubt cardiac etiology given SEM will order echo to r/o structural heart disease. ECG ok no high risk  Family history would consider f/u with EP for loop recorder if she has another episode that is clearly not precipitated By vagal inputs or position changes 2. HTN: .nblp 3. Chol: continue statin labs with primary  4. GI:  F/u Dr Hassell Done for revision of fundoplication clear to proceed from cardiac perspective    Current medicines are reviewed at length with the patient today.  The patient does not have concerns regarding medicines.  The following changes have been made:  no change  Labs/ tests ordered today include: Echo  No orders of the defined types were placed in this encounter.    Disposition:   FU with cardiology PRN       Signed, Jenkins Rouge, MD  02/27/2017 3:27 PM    [facility] [place], [place], [place]  [address] Phone: [phone]; Fax: [phone]

## 2017-02-27 ENCOUNTER — Ambulatory Visit (INDEPENDENT_AMBULATORY_CARE_PROVIDER_SITE_OTHER): Payer: Medicare Other | Admitting: Cardiovascular Disease

## 2017-02-27 ENCOUNTER — Encounter: Payer: Self-pay | Admitting: Cardiovascular Disease

## 2017-02-27 VITALS — BP 142/72 | HR 102 | Ht 62.0 in | Wt 145.0 lb

## 2017-02-27 DIAGNOSIS — R55 Syncope and collapse: Secondary | ICD-10-CM | POA: Diagnosis not present

## 2017-02-27 NOTE — Patient Instructions (Signed)
Your physician recommends that you schedule a follow-up appointment in:  As needed   Your physician has requested that you have an echocardiogram. Echocardiography is a painless test that uses sound waves to create images of your heart. It provides your doctor with information about the size and shape of your heart and how well your heart's chambers and valves are working. This procedure takes approximately one hour. There are no restrictions for this procedure.      No lab work ordered today     Your physician recommends that you continue on your current medications as directed. Please refer to the Current Medication list given to you today.      Thank you for choosing [facility] !

## 2017-03-03 ENCOUNTER — Ambulatory Visit (HOSPITAL_COMMUNITY)
Admission: RE | Admit: 2017-03-03 | Discharge: 2017-03-03 | Disposition: A | Discharge status: Home / Self Care | Payer: Medicare Other | Source: Ambulatory Visit | Attending: Cardiovascular Disease | Admitting: Cardiovascular Disease

## 2017-03-03 DIAGNOSIS — I080 Rheumatic disorders of both mitral and aortic valves: Secondary | ICD-10-CM | POA: Insufficient documentation

## 2017-03-03 DIAGNOSIS — Z720 Tobacco use: Secondary | ICD-10-CM | POA: Diagnosis not present

## 2017-03-03 DIAGNOSIS — R55 Syncope and collapse: Secondary | ICD-10-CM | POA: Insufficient documentation

## 2017-03-03 DIAGNOSIS — E119 Type 2 diabetes mellitus without complications: Secondary | ICD-10-CM | POA: Insufficient documentation

## 2017-03-03 NOTE — Progress Notes (Signed)
*  PRELIMINARY RESULTS* Echocardiogram 2D Echocardiogram has been performed.  Leavy Cella 03/03/2017, 9:48 AM

## 2017-03-07 NOTE — Progress Notes (Signed)
Please place orders in EPIC as patient is being scheduled for a pre-op appointment! Thank you! 

## 2017-03-13 NOTE — Patient Instructions (Signed)
KINISHA SOPER  03/13/2017   Your procedure is scheduled on: 03-21-17  Report to [facility] Main  Entrance Take [place]  elevators to 3rd floor to  [place] at 530AM.   Call this number if you have problems the morning of surgery [phone]   Remember: ONLY 1 PERSON MAY GO WITH YOU TO SHORT STAY TO GET  READY MORNING OF [place].  Do not eat food or drink liquids :After Midnight.     Take these medicines the morning of surgery with A SIP OF WATER: tylenol as needed, ranitidine(zantac) as needed  DO NOT TAKE ANY DIABETIC MEDICATIONS DAY OF YOUR SURGERY                               You may not have any metal on your body including hair pins and              piercings  Do not wear jewelry, make-up, lotions, powders or perfumes, deodorant             Do not wear nail polish.  Do not shave  48 hours prior to surgery.       Do not bring valuables to the hospital. [place].  Contacts, dentures or bridgework may not be worn into surgery.  Leave suitcase in the car. After surgery it may be brought to your room.               Please read over the following fact sheets you were given: _____________________________________________________________________             How to Manage Your Diabetes Before and After Surgery  Why is it important to control my blood sugar before and after surgery? . Improving blood sugar levels before and after surgery helps healing and can limit problems. . A way of improving blood sugar control is eating a healthy diet by: o  Eating less sugar and carbohydrates o  Increasing activity/exercise o  Talking with your doctor about reaching your blood sugar goals . High blood sugars (greater than 180 mg/dL) can raise your risk of infections and slow your recovery, so you will need to focus on controlling your diabetes during the weeks before surgery. . Make sure that the  doctor who takes care of your diabetes knows about your planned surgery including the date and location.  How do I manage my blood sugar before surgery? . Check your blood sugar at least 4 times a day, starting 2 days before surgery, to make sure that the level is not too high or low. o Check your blood sugar the morning of your surgery when you wake up and every 2 hours until you get to the Short Stay unit. . If your blood sugar is less than 70 mg/dL, you will need to treat for low blood sugar: o Do not take insulin. o Treat a low blood sugar (less than 70 mg/dL) with  cup of clear juice (cranberry or apple), 4 glucose tablets, OR glucose gel. o Recheck blood sugar in 15 minutes after treatment (to make sure it is greater than 70 mg/dL). If your blood sugar is not greater than 70 mg/dL on recheck, call [phone] for further instructions. Marland Kitchen  Report your blood sugar to the short stay nurse when you get to Short Stay.  . If you are admitted to the hospital after surgery: o Your blood sugar will be checked by the staff and you will probably be given insulin after surgery (instead of oral diabetes medicines) to make sure you have good blood sugar levels. o The goal for blood sugar control after surgery is 80-180 mg/dL.   WHAT DO I DO ABOUT MY DIABETES MEDICATION?   . THE DAY BEFORE SURGERY, take  METFORMIN as usual.       . THE MORNING OF SURGERY, Do not take oral diabetes medicines (pills)  Patient Signature:  Date:   Nurse Signature:  Date:   Reviewed and Endorsed by [facility] Patient Education Committee, August 2015  [facility] - Preparing for Surgery Before surgery, you can play an important role.  Because skin is not sterile, your skin needs to be as free of germs as possible.  You can reduce the number of germs on your skin by washing with CHG (chlorahexidine gluconate) soap before surgery.  CHG is an antiseptic cleaner which kills germs and bonds with the skin to continue  killing germs even after washing. Please DO NOT use if you have an allergy to CHG or antibacterial soaps.  If your skin becomes reddened/irritated stop using the CHG and inform your nurse when you arrive at Short Stay. Do not shave (including legs and underarms) for at least 48 hours prior to the first CHG shower.  You may shave your face/neck. Please follow these instructions carefully:  1.  Shower with CHG Soap the night before surgery and the  morning of Surgery.  2.  If you choose to wash your hair, wash your hair first as usual with your  normal  shampoo.  3.  After you shampoo, rinse your hair and body thoroughly to remove the  shampoo.                           4.  Use CHG as you would any other liquid soap.  You can apply chg directly  to the skin and wash                       Gently with a scrungie or clean washcloth.  5.  Apply the CHG Soap to your body ONLY FROM THE NECK DOWN.   Do not use on face/ open                           Wound or open sores. Avoid contact with eyes, ears mouth and genitals (private parts).                       Wash face,  Genitals (private parts) with your normal soap.             6.  Wash thoroughly, paying special attention to the area where your surgery  will be performed.  7.  Thoroughly rinse your body with warm water from the neck down.  8.  DO NOT shower/wash with your normal soap after using and rinsing off  the CHG Soap.                9.  Pat yourself dry with a clean towel.            10.  Wear clean pajamas.            11.  Place clean sheets on your bed the night of your first shower and do not  sleep with pets. Day of Surgery : Do not apply any lotions/deodorants the morning of surgery.  Please wear clean clothes to the hospital/surgery center.  FAILURE TO FOLLOW THESE INSTRUCTIONS MAY RESULT IN THE CANCELLATION OF YOUR SURGERY PATIENT SIGNATURE_________________________________  NURSE  SIGNATURE__________________________________  ________________________________________________________________________

## 2017-03-13 NOTE — Progress Notes (Signed)
LOV cardiology/ clearance Dr Johnsie Cancel 02-27-17 epic  " summart note reads : "Syncope doubt cardiac etiology given SEM will order echo to r/o structural heart disease. ECG ok no high risk (...) GI:  F/u Dr Hassell Done for revision of fundoplication clear to proceed from cardiac perspective "  ECHO 03-03-17 epic "Aortic valve:   Mildly calcified annulus. Trileaflet; mildly thickened leaflets.  Doppler:   There was no stenosis.   There was no significant regurgitation.  "  EKG 11-24-16 epic

## 2017-03-14 ENCOUNTER — Encounter (HOSPITAL_COMMUNITY): Payer: Self-pay

## 2017-03-14 ENCOUNTER — Encounter (HOSPITAL_COMMUNITY)
Admission: RE | Admit: 2017-03-14 | Discharge: 2017-03-14 | Disposition: A | Discharge status: Home / Self Care | Payer: Medicare Other | Source: Ambulatory Visit | Attending: Surgery | Admitting: Surgery

## 2017-03-14 DIAGNOSIS — K449 Diaphragmatic hernia without obstruction or gangrene: Secondary | ICD-10-CM | POA: Diagnosis not present

## 2017-03-14 DIAGNOSIS — Z7984 Long term (current) use of oral hypoglycemic drugs: Secondary | ICD-10-CM | POA: Diagnosis not present

## 2017-03-14 DIAGNOSIS — Z01818 Encounter for other preprocedural examination: Secondary | ICD-10-CM | POA: Diagnosis not present

## 2017-03-14 DIAGNOSIS — Z79899 Other long term (current) drug therapy: Secondary | ICD-10-CM | POA: Insufficient documentation

## 2017-03-14 DIAGNOSIS — Z7982 Long term (current) use of aspirin: Secondary | ICD-10-CM | POA: Insufficient documentation

## 2017-03-14 LAB — BASIC METABOLIC PANEL
ANION GAP: 9 (ref 5–15)
BUN: 13 mg/dL (ref 6–20)
CHLORIDE: 105 mmol/L (ref 101–111)
CO2: 26 mmol/L (ref 22–32)
Calcium: 9.3 mg/dL (ref 8.9–10.3)
Creatinine, Ser: 0.7 mg/dL (ref 0.44–1.00)
GFR calc Af Amer: >60 mL/min (ref >60)
GLUCOSE: 101 mg/dL — AB (ref 65–99)
POTASSIUM: 4.3 mmol/L (ref 3.5–5.1)
SODIUM: 140 mmol/L (ref 135–145)

## 2017-03-14 LAB — GLUCOSE, CAPILLARY: Glucose-Capillary: 109 mg/dL — ABNORMAL HIGH (ref 65–99)

## 2017-03-14 LAB — CBC
HCT: 45.9 % (ref 36.0–46.0)
HEMOGLOBIN: 15.5 g/dL — AB (ref 12.0–15.0)
MCH: 29.6 pg (ref 26.0–34.0)
MCHC: 33.8 g/dL (ref 30.0–36.0)
MCV: 87.6 fL (ref 78.0–100.0)
PLATELETS: 235 10*3/uL (ref 150–400)
RBC: 5.24 MIL/uL — AB (ref 3.87–5.11)
RDW: 14.2 % (ref 11.5–15.5)
WBC: 9.1 10*3/uL (ref 4.0–10.5)

## 2017-03-14 NOTE — Progress Notes (Signed)
Creatinine lab 02-18-17 on chart

## 2017-03-15 LAB — HEMOGLOBIN A1C
Hgb A1c MFr Bld: 6 % — ABNORMAL HIGH (ref 4.8–5.6)
Mean Plasma Glucose: 126 mg/dL

## 2017-03-19 DIAGNOSIS — H35372 Puckering of macula, left eye: Secondary | ICD-10-CM | POA: Diagnosis not present

## 2017-03-19 DIAGNOSIS — H43811 Vitreous degeneration, right eye: Secondary | ICD-10-CM | POA: Diagnosis not present

## 2017-03-19 DIAGNOSIS — H25813 Combined forms of age-related cataract, bilateral: Secondary | ICD-10-CM | POA: Diagnosis not present

## 2017-03-19 DIAGNOSIS — E119 Type 2 diabetes mellitus without complications: Secondary | ICD-10-CM | POA: Diagnosis not present

## 2017-03-19 DIAGNOSIS — H17823 Peripheral opacity of cornea, bilateral: Secondary | ICD-10-CM | POA: Diagnosis not present

## 2017-03-19 NOTE — H&P (Signed)
Courtney Dorsey 01/01/2017 1:35 PM Location: [facility] Patient #: [id number] DOB: 09-Jul-1946 Married / Language: Courtney Dorsey / Race: White Female   History of Present Illness Courtney Dorsey B. Courtney Done MD; 01/01/2017 2:11 PM) Patient words: Courtney Dorsey and her husband come in this afternoon. She's been having some episodes of dry heaves at home. This initially started with a viral bug and vomiting. Back in 2017 performed a repair of a large type III mixed hiatal hernia. A 6 suture closure of her diaphragm was performed and a Nissen over 56 dilator. Dr. Excell Seltzer assisted. She did well initially until she had this episode of upper GI but with vomiting. Since then she's been having episodes of dry heaves and was recently admitted to the hospital in any pain in when she passed out. I got an upper GI series on her which I reviewed it appears that she is herniating some of her stomach again above her diaphragm. I think the best way to approach this will be to do a laparoscopic takedown been tried again. This reduced and the anatomy sorted out. Then probably place a G-tube or a gastropexy to secure the stomach below the diaphragm. I have explained this to her and her husband and we'll proceed.  The patient is a 71 year old female.   Allergies Courtney Dorsey, [place]; 01/01/2017 1:36 PM) No Known Drug Allergies 08/31/2015  Medication History Courtney Dorsey, [place]; 01/01/2017 1:36 PM) Enalapril Maleate (10MG  Tablet, Oral) Active. Aspirin (81MG  Tablet DR, Oral) Active. Atorvastatin Calcium (40MG  Tablet, Oral) Active. MetFORMIN HCl (500MG  Tablet, Oral) Active. Medications Reconciled  Vitals Courtney Dorsey RMA; 01/01/2017 1:37 PM) 01/01/2017 1:36 PM Weight: 149.8 lb Height: 61in Body Surface Area: 1.67 m Body Mass Index: 28.3 kg/m  Temp.: 97.89F  Pulse: 83 (Regular)  BP: 160/92 (Sitting, Left Arm, Standard)       Physical Exam (Courtney Dorsey B. Courtney Done MD; 01/01/2017 2:13  PM) General Note: WDWNWF NAD-now HEENT unremarkable Neck supple Chest clear Heart SR without murmurs Abdomen nontender Ext FROM Neuro alert and oriented x 3     Assessment & Plan Courtney Dorsey B. Courtney Done MD; 01/01/2017 2:13 PM) HIATAL HERNIA (K44.9) Impression: Will set up for redo Nissen fundoplication  Courtney B. Courtney Done, MD, FACS

## 2017-03-20 ENCOUNTER — Ambulatory Visit: Payer: Self-pay | Admitting: Surgery

## 2017-03-20 NOTE — Progress Notes (Signed)
Called [facility] triage desk to request orders for 0530 03/21/17. Abigail Butts in triage is to have text message sent to Dr Hassell Done

## 2017-03-20 NOTE — Anesthesia Preprocedure Evaluation (Addendum)
Anesthesia Evaluation  Patient identified by MRN, date of birth, ID band Patient awake    Reviewed: Allergy & Precautions, NPO status , Patient's Chart, lab work & pertinent test results  Airway Mallampati: III  TM Distance: >3 FB     Dental   Pulmonary Current Smoker,    Pulmonary exam normal        Cardiovascular hypertension, Pt. on medications Normal cardiovascular exam  Pre-op eval per cardiology ([place])  ECG: SR, rate 98  ECHO:  Left ventricle: The cavity size was normal. Wall thickness was normal. Systolic function was normal. The estimated ejection fraction was in the range of 60% to 65%. Wall motion was normal; there were no regional wall motion abnormalities. Doppler parameters are consistent with abnormal left ventricular relaxation (grade 1 diastolic dysfunction). Aortic valve: Mildly calcified annulus. Trileaflet; mildly thickened leaflets. Valve area (VTI): 1.65 cm^2. Valve area (Vmax): 1.75 cm^2. Mitral valve: Mildly to moderately calcified annulus. Mildly thickened leaflets .   Neuro/Psych  Neuromuscular disease    GI/Hepatic hiatal hernia,   Endo/Other  diabetes, Type 2, Oral Hypoglycemic Agents  Renal/GU      Musculoskeletal   Abdominal   Peds  Hematology   Anesthesia Other Findings Hyperlipidemia  Reproductive/Obstetrics                            Anesthesia Physical  Anesthesia Plan  ASA: III  Anesthesia Plan: General   Post-op Pain Management:    Induction: Intravenous  PONV Risk Score and Plan: 2 and Ondansetron, Dexamethasone, Propofol and Midazolam  Airway Management Planned: Oral ETT  Additional Equipment:   Intra-op Plan:   Post-operative Plan: Extubation in OR  Informed Consent: I have reviewed the patients History and Physical, chart, labs and discussed the procedure including the risks, benefits and alternatives for the proposed anesthesia with  the patient or authorized representative who has indicated his/her understanding and acceptance.     Plan Discussed with: CRNA and Surgeon  Anesthesia Plan Comments:         Anesthesia Quick Evaluation

## 2017-03-21 ENCOUNTER — Encounter (HOSPITAL_COMMUNITY): Payer: Self-pay

## 2017-03-21 ENCOUNTER — Encounter (HOSPITAL_COMMUNITY)
Admission: RE | Disposition: A | Discharge status: Home / Self Care | Payer: Self-pay | Source: Ambulatory Visit | Attending: Surgery

## 2017-03-21 ENCOUNTER — Inpatient Hospital Stay (HOSPITAL_COMMUNITY)
Admission: RE | Admit: 2017-03-21 | Discharge: 2017-03-23 | DRG: 328 | Disposition: A | Discharge status: Home / Self Care | Payer: Medicare Other | Source: Ambulatory Visit | Attending: Surgery | Admitting: Surgery

## 2017-03-21 ENCOUNTER — Inpatient Hospital Stay (HOSPITAL_COMMUNITY): Payer: Medicare Other | Admitting: Anesthesiology

## 2017-03-21 DIAGNOSIS — F17200 Nicotine dependence, unspecified, uncomplicated: Secondary | ICD-10-CM | POA: Diagnosis present

## 2017-03-21 DIAGNOSIS — Z7984 Long term (current) use of oral hypoglycemic drugs: Secondary | ICD-10-CM | POA: Diagnosis not present

## 2017-03-21 DIAGNOSIS — R1310 Dysphagia, unspecified: Secondary | ICD-10-CM | POA: Diagnosis present

## 2017-03-21 DIAGNOSIS — E119 Type 2 diabetes mellitus without complications: Secondary | ICD-10-CM | POA: Diagnosis present

## 2017-03-21 DIAGNOSIS — I10 Essential (primary) hypertension: Secondary | ICD-10-CM | POA: Diagnosis present

## 2017-03-21 DIAGNOSIS — K449 Diaphragmatic hernia without obstruction or gangrene: Secondary | ICD-10-CM | POA: Diagnosis present

## 2017-03-21 DIAGNOSIS — E785 Hyperlipidemia, unspecified: Secondary | ICD-10-CM | POA: Diagnosis present

## 2017-03-21 DIAGNOSIS — F17210 Nicotine dependence, cigarettes, uncomplicated: Secondary | ICD-10-CM | POA: Diagnosis present

## 2017-03-21 DIAGNOSIS — K660 Peritoneal adhesions (postprocedural) (postinfection): Secondary | ICD-10-CM | POA: Diagnosis not present

## 2017-03-21 HISTORY — PX: HIATAL HERNIA REPAIR: SHX195

## 2017-03-21 LAB — CREATININE, SERUM
CREATININE: 0.81 mg/dL (ref 0.44–1.00)
GFR calc non Af Amer: >60 mL/min (ref >60)

## 2017-03-21 LAB — GLUCOSE, CAPILLARY
GLUCOSE-CAPILLARY: 154 mg/dL — AB (ref 65–99)
GLUCOSE-CAPILLARY: 158 mg/dL — AB (ref 65–99)
Glucose-Capillary: 107 mg/dL — ABNORMAL HIGH (ref 65–99)
Glucose-Capillary: 114 mg/dL — ABNORMAL HIGH (ref 65–99)
Glucose-Capillary: 128 mg/dL — ABNORMAL HIGH (ref 65–99)
Glucose-Capillary: 156 mg/dL — ABNORMAL HIGH (ref 65–99)

## 2017-03-21 LAB — CBC
HCT: 43.8 % (ref 36.0–46.0)
Hemoglobin: 14.4 g/dL (ref 12.0–15.0)
MCH: 29.4 pg (ref 26.0–34.0)
MCHC: 32.9 g/dL (ref 30.0–36.0)
MCV: 89.6 fL (ref 78.0–100.0)
PLATELETS: 200 10*3/uL (ref 150–400)
RBC: 4.89 MIL/uL (ref 3.87–5.11)
RDW: 14.6 % (ref 11.5–15.5)
WBC: 19.6 10*3/uL — AB (ref 4.0–10.5)

## 2017-03-21 SURGERY — REPAIR, HERNIA, HIATAL, LAPAROSCOPIC
Anesthesia: General

## 2017-03-21 MED ORDER — KCL IN DEXTROSE-NACL 20-5-0.45 MEQ/L-%-% IV SOLN
INTRAVENOUS | Status: DC
  Administered 2017-03-21 – 2017-03-22 (×3): via INTRAVENOUS
  Filled 2017-03-21 (×4): qty 1000

## 2017-03-21 MED ORDER — BUPIVACAINE LIPOSOME 1.3 % IJ SUSP
20.0000 mL | Freq: Once | INTRAMUSCULAR | Status: DC
  Filled 2017-03-21: qty 20

## 2017-03-21 MED ORDER — BUPIVACAINE-EPINEPHRINE 0.25% -1:200000 IJ SOLN
INTRAMUSCULAR | Status: AC
  Filled 2017-03-21: qty 1

## 2017-03-21 MED ORDER — CEFOTETAN DISODIUM-DEXTROSE 2-2.08 GM-% IV SOLR
INTRAVENOUS | Status: AC
  Filled 2017-03-21: qty 50

## 2017-03-21 MED ORDER — HEPARIN SODIUM (PORCINE) 5000 UNIT/ML IJ SOLN
5000.0000 [IU] | Freq: Three times a day (TID) | INTRAMUSCULAR | Status: DC
  Administered 2017-03-21 – 2017-03-23 (×4): 5000 [IU] via SUBCUTANEOUS
  Filled 2017-03-21 (×5): qty 1

## 2017-03-21 MED ORDER — OXYCODONE HCL 5 MG PO TABS: 5.0000 mg | ORAL_TABLET | Freq: Once | ORAL | Status: DC | PRN

## 2017-03-21 MED ORDER — LIDOCAINE HCL (CARDIAC) 20 MG/ML IV SOLN
INTRAVENOUS | Status: DC | PRN
  Administered 2017-03-21: 25 mg via INTRATRACHEAL

## 2017-03-21 MED ORDER — FENTANYL CITRATE (PF) 250 MCG/5ML IJ SOLN
INTRAMUSCULAR | Status: AC
  Filled 2017-03-21: qty 5

## 2017-03-21 MED ORDER — HYDROMORPHONE HCL-NACL 0.5-0.9 MG/ML-% IV SOSY
PREFILLED_SYRINGE | INTRAVENOUS | Status: AC
  Filled 2017-03-21: qty 1

## 2017-03-21 MED ORDER — HYDRALAZINE HCL 20 MG/ML IJ SOLN
10.0000 mg | INTRAMUSCULAR | Status: DC | PRN
  Filled 2017-03-21: qty 0.5

## 2017-03-21 MED ORDER — KETOROLAC TROMETHAMINE 30 MG/ML IJ SOLN
INTRAMUSCULAR | Status: DC | PRN
  Administered 2017-03-21: 30 mg via INTRAVENOUS

## 2017-03-21 MED ORDER — DEXAMETHASONE SODIUM PHOSPHATE 10 MG/ML IJ SOLN
INTRAMUSCULAR | Status: DC | PRN
  Administered 2017-03-21: 10 mg via INTRAVENOUS

## 2017-03-21 MED ORDER — HYDROMORPHONE HCL-NACL 0.5-0.9 MG/ML-% IV SOSY
0.2500 mg | PREFILLED_SYRINGE | INTRAVENOUS | Status: DC | PRN
  Administered 2017-03-21 (×2): 0.25 mg via INTRAVENOUS

## 2017-03-21 MED ORDER — FENTANYL CITRATE (PF) 250 MCG/5ML IJ SOLN
INTRAMUSCULAR | Status: DC | PRN
  Administered 2017-03-21 (×3): 50 ug via INTRAVENOUS
  Administered 2017-03-21: 100 ug via INTRAVENOUS
  Administered 2017-03-21 (×2): 50 ug via INTRAVENOUS

## 2017-03-21 MED ORDER — FENTANYL CITRATE (PF) 100 MCG/2ML IJ SOLN
12.5000 ug | INTRAMUSCULAR | Status: DC | PRN
  Administered 2017-03-21 – 2017-03-22 (×3): 12.5 ug via INTRAVENOUS
  Filled 2017-03-21 (×3): qty 2

## 2017-03-21 MED ORDER — ROCURONIUM 10MG/ML (10ML) SYRINGE FOR MEDFUSION PUMP - OPTIME
INTRAVENOUS | Status: DC | PRN
  Administered 2017-03-21: 50 mg via INTRAVENOUS
  Administered 2017-03-21: 10 mg via INTRAVENOUS
  Administered 2017-03-21: 20 mg via INTRAVENOUS

## 2017-03-21 MED ORDER — PHENYLEPHRINE HCL 10 MG/ML IJ SOLN
INTRAMUSCULAR | Status: DC | PRN
  Administered 2017-03-21 (×2): 80 ug via INTRAVENOUS
  Administered 2017-03-21 (×3): 40 ug via INTRAVENOUS
  Administered 2017-03-21: 80 ug via INTRAVENOUS
  Administered 2017-03-21: 40 ug via INTRAVENOUS
  Administered 2017-03-21: 80 ug via INTRAVENOUS

## 2017-03-21 MED ORDER — LABETALOL HCL 5 MG/ML IV SOLN
INTRAVENOUS | Status: DC | PRN
  Administered 2017-03-21: 5 mg via INTRAVENOUS

## 2017-03-21 MED ORDER — PANTOPRAZOLE SODIUM 40 MG IV SOLR
40.0000 mg | Freq: Every day | INTRAVENOUS | Status: DC
  Administered 2017-03-21 – 2017-03-22 (×2): 40 mg via INTRAVENOUS
  Filled 2017-03-21 (×2): qty 40

## 2017-03-21 MED ORDER — MIDAZOLAM HCL 2 MG/2ML IJ SOLN
INTRAMUSCULAR | Status: DC | PRN
  Administered 2017-03-21: 2 mg via INTRAVENOUS

## 2017-03-21 MED ORDER — ONDANSETRON 4 MG PO TBDP: 4.0000 mg | ORAL_TABLET | Freq: Four times a day (QID) | ORAL | Status: DC | PRN

## 2017-03-21 MED ORDER — LACTATED RINGERS IV SOLN
INTRAVENOUS | Status: DC | PRN
  Administered 2017-03-21 (×2): via INTRAVENOUS

## 2017-03-21 MED ORDER — ONDANSETRON HCL 4 MG/2ML IJ SOLN
INTRAMUSCULAR | Status: AC
  Filled 2017-03-21: qty 2

## 2017-03-21 MED ORDER — CHLORHEXIDINE GLUCONATE CLOTH 2 % EX PADS: 6.0000 | MEDICATED_PAD | Freq: Once | CUTANEOUS | Status: DC

## 2017-03-21 MED ORDER — CEFOTETAN DISODIUM-DEXTROSE 2-2.08 GM-% IV SOLR
2.0000 g | INTRAVENOUS | Status: AC
  Administered 2017-03-21: 2 g via INTRAVENOUS
  Filled 2017-03-21: qty 50

## 2017-03-21 MED ORDER — PROMETHAZINE HCL 25 MG/ML IJ SOLN
12.5000 mg | Freq: Four times a day (QID) | INTRAMUSCULAR | Status: DC | PRN
  Administered 2017-03-21: 12.5 mg via INTRAVENOUS
  Filled 2017-03-21: qty 1

## 2017-03-21 MED ORDER — SUGAMMADEX SODIUM 200 MG/2ML IV SOLN
INTRAVENOUS | Status: DC | PRN
  Administered 2017-03-21: 120 mg via INTRAVENOUS

## 2017-03-21 MED ORDER — ROCURONIUM BROMIDE 50 MG/5ML IV SOSY
PREFILLED_SYRINGE | INTRAVENOUS | Status: AC
  Filled 2017-03-21: qty 5

## 2017-03-21 MED ORDER — PROPOFOL 10 MG/ML IV BOLUS
INTRAVENOUS | Status: DC | PRN
  Administered 2017-03-21: 200 mg via INTRAVENOUS

## 2017-03-21 MED ORDER — HEPARIN SODIUM (PORCINE) 5000 UNIT/ML IJ SOLN
5000.0000 [IU] | Freq: Once | INTRAMUSCULAR | Status: AC
  Administered 2017-03-21: 5000 [IU] via SUBCUTANEOUS
  Filled 2017-03-21: qty 1

## 2017-03-21 MED ORDER — MIDAZOLAM HCL 2 MG/2ML IJ SOLN
INTRAMUSCULAR | Status: AC
  Filled 2017-03-21: qty 2

## 2017-03-21 MED ORDER — KETOROLAC TROMETHAMINE 30 MG/ML IJ SOLN
INTRAMUSCULAR | Status: AC
  Filled 2017-03-21: qty 1

## 2017-03-21 MED ORDER — HYDROCODONE-ACETAMINOPHEN 5-325 MG PO TABS
1.0000 | ORAL_TABLET | ORAL | Status: DC | PRN
  Administered 2017-03-22 (×3): 1 via ORAL
  Administered 2017-03-22: 2 via ORAL
  Administered 2017-03-23: 1 via ORAL
  Filled 2017-03-21 (×2): qty 1
  Filled 2017-03-21: qty 2
  Filled 2017-03-21 (×2): qty 1

## 2017-03-21 MED ORDER — SODIUM CHLORIDE 0.9 % IR SOLN
Status: DC | PRN
  Administered 2017-03-21: 1000 mL

## 2017-03-21 MED ORDER — DEXAMETHASONE SODIUM PHOSPHATE 10 MG/ML IJ SOLN
INTRAMUSCULAR | Status: AC
  Filled 2017-03-21: qty 1

## 2017-03-21 MED ORDER — CEFAZOLIN SODIUM-DEXTROSE 2-4 GM/100ML-% IV SOLN
2.0000 g | Freq: Three times a day (TID) | INTRAVENOUS | Status: AC
  Administered 2017-03-21: 2 g via INTRAVENOUS
  Filled 2017-03-21: qty 100

## 2017-03-21 MED ORDER — PROPOFOL 10 MG/ML IV BOLUS
INTRAVENOUS | Status: AC
  Filled 2017-03-21: qty 40

## 2017-03-21 MED ORDER — INSULIN ASPART 100 UNIT/ML ~~LOC~~ SOLN
0.0000 [IU] | SUBCUTANEOUS | Status: DC
  Administered 2017-03-21: 2 [IU] via SUBCUTANEOUS
  Administered 2017-03-21 – 2017-03-22 (×3): 1 [IU] via SUBCUTANEOUS

## 2017-03-21 MED ORDER — SUCCINYLCHOLINE CHLORIDE 200 MG/10ML IV SOSY
PREFILLED_SYRINGE | INTRAVENOUS | Status: AC
  Filled 2017-03-21: qty 10

## 2017-03-21 MED ORDER — PROMETHAZINE HCL 25 MG/ML IJ SOLN: 6.2500 mg | INTRAMUSCULAR | Status: DC | PRN

## 2017-03-21 MED ORDER — FAMOTIDINE IN NACL 20-0.9 MG/50ML-% IV SOLN
20.0000 mg | Freq: Once | INTRAVENOUS | Status: AC
  Administered 2017-03-21: 20 mg via INTRAVENOUS
  Filled 2017-03-21: qty 50

## 2017-03-21 MED ORDER — ONDANSETRON HCL 4 MG/2ML IJ SOLN
4.0000 mg | Freq: Four times a day (QID) | INTRAMUSCULAR | Status: DC | PRN
  Administered 2017-03-21: 4 mg via INTRAVENOUS
  Filled 2017-03-21 (×2): qty 2

## 2017-03-21 MED ORDER — OXYCODONE HCL 5 MG/5ML PO SOLN
5.0000 mg | Freq: Once | ORAL | Status: DC | PRN
  Filled 2017-03-21: qty 5

## 2017-03-21 MED ORDER — PHENYLEPHRINE 40 MCG/ML (10ML) SYRINGE FOR IV PUSH (FOR BLOOD PRESSURE SUPPORT)
PREFILLED_SYRINGE | INTRAVENOUS | Status: AC
  Filled 2017-03-21: qty 10

## 2017-03-21 MED ORDER — BUPIVACAINE-EPINEPHRINE 0.25% -1:200000 IJ SOLN
INTRAMUSCULAR | Status: DC | PRN
  Administered 2017-03-21: 20 mL

## 2017-03-21 MED ORDER — LIDOCAINE 2% (20 MG/ML) 5 ML SYRINGE
INTRAMUSCULAR | Status: AC
  Filled 2017-03-21: qty 5

## 2017-03-21 MED ORDER — ONDANSETRON HCL 4 MG/2ML IJ SOLN
INTRAMUSCULAR | Status: DC | PRN
  Administered 2017-03-21: 4 mg via INTRAVENOUS

## 2017-03-21 SURGICAL SUPPLY — 49 items
ADH SKN CLS APL DERMABOND .7 (GAUZE/BANDAGES/DRESSINGS) ×1
APL SKNCLS STERI-STRIP NONHPOA (GAUZE/BANDAGES/DRESSINGS)
APPLIER CLIP ROT 10 11.4 M/L (STAPLE)
APR CLP MED LRG 11.4X10 (STAPLE)
BENZOIN TINCTURE PRP APPL 2/3 (GAUZE/BANDAGES/DRESSINGS) IMPLANT
CABLE HIGH FREQUENCY MONO STRZ (ELECTRODE) IMPLANT
CLIP APPLIE ROT 10 11.4 M/L (STAPLE) IMPLANT
COVER SURGICAL LIGHT HANDLE (MISCELLANEOUS) ×2 IMPLANT
DECANTER SPIKE VIAL GLASS SM (MISCELLANEOUS) ×2 IMPLANT
DERMABOND ADVANCED (GAUZE/BANDAGES/DRESSINGS) ×1
DERMABOND ADVANCED .7 DNX12 (GAUZE/BANDAGES/DRESSINGS) ×1 IMPLANT
DEVICE SUT QUICK LOAD TK 5 (STAPLE) IMPLANT
DEVICE SUT TI-KNOT TK 5X26 (MISCELLANEOUS) IMPLANT
DEVICE SUTURE ENDOST 10MM (ENDOMECHANICALS) ×2 IMPLANT
DISSECTOR BLUNT TIP ENDO 5MM (MISCELLANEOUS) ×2 IMPLANT
DRAIN PENROSE 18X1/2 LTX STRL (DRAIN) ×2 IMPLANT
ELECT PENCIL ROCKER SW 15FT (MISCELLANEOUS) IMPLANT
ELECT REM PT RETURN 15FT ADLT (MISCELLANEOUS) ×2 IMPLANT
GLOVE BIOGEL M 8.0 STRL (GLOVE) ×2 IMPLANT
GOWN STRL REUS W/TWL XL LVL3 (GOWN DISPOSABLE) ×8 IMPLANT
IRRIG SUCT STRYKERFLOW 2 WTIP (MISCELLANEOUS) ×2
IRRIGATION SUCT STRKRFLW 2 WTP (MISCELLANEOUS) ×1 IMPLANT
KIT BASIN OR (CUSTOM PROCEDURE TRAY) ×2 IMPLANT
NDL SPNL 22GX3.5 QUINCKE BK (NEEDLE) ×1 IMPLANT
NEEDLE SPNL 22GX3.5 QUINCKE BK (NEEDLE) ×2 IMPLANT
PAD POSITIONING PINK XL (MISCELLANEOUS) IMPLANT
POSITIONER SURGICAL ARM (MISCELLANEOUS) IMPLANT
SCISSORS LAP 5X45 EPIX DISP (ENDOMECHANICALS) ×2 IMPLANT
SCRUB TECHNI CARE 4 OZ NO DYE (MISCELLANEOUS) ×2 IMPLANT
SHEARS HARMONIC ACE PLUS 45CM (MISCELLANEOUS) ×2 IMPLANT
SLEEVE ADV FIXATION 5X100MM (TROCAR) ×3 IMPLANT
SOLUTION ANTI FOG 6CC (MISCELLANEOUS) ×2 IMPLANT
STAPLER VISISTAT 35W (STAPLE) ×2 IMPLANT
STRIP CLOSURE SKIN 1/2X4 (GAUZE/BANDAGES/DRESSINGS) IMPLANT
SUT MNCRL AB 4-0 PS2 18 (SUTURE) ×3 IMPLANT
SUT SURGIDAC NAB ES-9 0 48 120 (SUTURE) ×8 IMPLANT
SUT VIC AB 4-0 SH 18 (SUTURE) ×2 IMPLANT
TAPE CLOTH 4X10 WHT NS (GAUZE/BANDAGES/DRESSINGS) IMPLANT
TIP INNERVISION DETACH 40FR (MISCELLANEOUS) IMPLANT
TIP INNERVISION DETACH 50FR (MISCELLANEOUS) IMPLANT
TIP INNERVISION DETACH 56FR (MISCELLANEOUS) IMPLANT
TIPS INNERVISION DETACH 40FR (MISCELLANEOUS)
TOWEL OR 17X26 10 PK STRL BLUE (TOWEL DISPOSABLE) ×2 IMPLANT
TRAY FOLEY CATH 14FR (SET/KITS/TRAYS/PACK) ×1 IMPLANT
TRAY LAPAROSCOPIC (CUSTOM PROCEDURE TRAY) ×2 IMPLANT
TROCAR ADV FIXATION 11X100MM (TROCAR) ×1 IMPLANT
TROCAR ADV FIXATION 5X100MM (TROCAR) IMPLANT
TROCAR BLADELESS OPT 5 100 (ENDOMECHANICALS) ×2 IMPLANT
TUBING INSUF HEATED (TUBING) ×2 IMPLANT

## 2017-03-21 NOTE — Anesthesia Postprocedure Evaluation (Signed)
Anesthesia Post Note  Patient: Courtney Dorsey  Procedure(s) Performed: Procedure(s) (LRB): LAPAROSCOPIC lysis of adhesions,reduction hiatal hernia, upper endoscopy,gastropexy (N/A)     Patient location during evaluation: PACU Anesthesia Type: General Level of consciousness: awake and alert Pain management: pain level controlled Vital Signs Assessment: post-procedure vital signs reviewed and stable Respiratory status: spontaneous breathing, nonlabored ventilation, respiratory function stable and patient connected to nasal cannula oxygen Cardiovascular status: blood pressure returned to baseline and stable Postop Assessment: no signs of nausea or vomiting Anesthetic complications: no    Last Vitals:  Vitals:   03/21/17 1215 03/21/17 1310  BP: 131/68 (!) 106/56  Pulse: 80 68  Resp: 14 14  Temp: 36.9 C 36.7 C    Last Pain:  Vitals:   03/21/17 1310  TempSrc: Oral  PainSc:                  Ryan P Ellender

## 2017-03-21 NOTE — Transfer of Care (Signed)
Immediate Anesthesia Transfer of Care Note  Patient: Courtney Dorsey  Procedure(s) Performed: Procedure(s): LAPAROSCOPIC lysis of adhesions,reduction hiatal hernia, upper endoscopy,gastropexy (N/A)  Patient Location: PACU  Anesthesia Type:General  Level of Consciousness: sedated  Airway & Oxygen Therapy: Patient connected to face mask oxygen  Post-op Assessment: Report given to RN and Post -op Vital signs reviewed and stable  Post vital signs: stable  Last Vitals:  Vitals:   03/21/17 0545  BP: 138/68  Pulse: 88  Resp: 16  Temp: 36.6 C    Last Pain:  Vitals:   03/21/17 0545  TempSrc: Oral      Patients Stated Pain Goal: 4 (90/38/33 3832)  Complications: No apparent anesthesia complications

## 2017-03-21 NOTE — Op Note (Signed)
Surgeon: Kaylyn Lim, MD, FACS  Asst:  Armandina Gemma, MD,FACS  Anes:  general  Preop Dx: Recurrent hiatal hernia  Postop Dx: same  Procedure: Laparoscopic enterolysis, upper endoscopy, gastropexy Findings: Large recurrent hiatal defect, EG junction up in the mediastinum  Location Surgery: [facility] OR 1 Complications: None noted  EBL:   10 cc  Drains: none  Description of Procedure:  The patient was taken to OR 1 .  After anesthesia was administered and the patient was prepped a timeout was performed.  Access was achieved with a 5 mm Optiview through the left upper quadrant without difficulty. 5 mm to the left of midline placed for camera port.  A 12 was placed to the right of the midline along with a 5 Left Way over the right and upper midline with the [facility] retractor. Upon looking at the diaphragm with the left lateral segment retracted there was a huge diaphragmatic defect and the stomach was once again out of the chest. Preoperatively the patient had reported no further problems with vomiting or reflux was actually doing very well.  Using the Harmonic Scalpel on mobilized the adhesions and were tired higher only find at the EG junction was stuck and fused to the surrounding tissue in the mediastinum.  At that point I went ahead and passed the endoscope and found the squamocolumnar junction and it was in fact well above the diaphragmatic opening. There was no evidence of any esophagitis in the scope easily passed through the wrap remnant and into the distal stomach. It would appear that with the wrap intact that this had been pulled up into this hiatal recurrence. With the size of this defect I did not feel that I was going to be able to close that primarily nor was I began to be able to get the EG junction below the diaphragm. Therefore I will elected to packs the stomach to the anterior abdominal wall below the right costal margin. A site was selected and using the Endo Stitch and 2 of [place]  I placed 2 horizontal sutures through the stomach and then with the Endo Close pulled those out and tied those intact him to the anterior abdominal wall. This was done under direct visualization with the scope with a lowered  Pneumoperitoneum.  Everything appeared to be in order. There is no evidence of any enterotomies and no bleeding. The port sites were injected with Exparel and closed with 4-0 Monocryl and Dermabond. The patient tolerated the procedure well and was taken to the PACU in stable condition.     Matt B. Hassell Done, [place], [facility], [place]

## 2017-03-21 NOTE — Interval H&P Note (Signed)
History and Physical Interval Note:  03/21/2017 7:20 AM  Courtney Dorsey  has presented today for surgery, with the diagnosis of recurrent hiatal hernia  The various methods of treatment have been discussed with the patient and family. After consideration of risks, benefits and other options for treatment, the patient has consented to  Procedure(s): [place] (N/A) as a surgical intervention .  The patient's history has been reviewed, patient examined, no change in status, stable for surgery.  I have reviewed the patient's chart and labs.  Questions were answered to the patient's satisfaction.     Courtney Dorsey B

## 2017-03-21 NOTE — Anesthesia Procedure Notes (Signed)
Procedure Name: Intubation Date/Time: 03/21/2017 7:36 AM Performed by: Pilar Grammes Pre-anesthesia Checklist: Patient identified, Emergency Drugs available, Suction available, Patient being monitored and Timeout performed Patient Re-evaluated:Patient Re-evaluated prior to induction Oxygen Delivery Method: Circle system utilized Preoxygenation: Pre-oxygenation with 100% oxygen Induction Type: IV induction Ventilation: Mask ventilation without difficulty Laryngoscope Size: Miller and 2 Grade View: Grade I Tube type: Oral Tube size: 7.5 mm Number of attempts: 1 Airway Equipment and Method: Stylet Placement Confirmation: positive ETCO2,  ETT inserted through vocal cords under direct vision,  CO2 detector and breath sounds checked- equal and bilateral Secured at: 20 cm Tube secured with: Tape Dental Injury: Teeth and Oropharynx as per pre-operative assessment

## 2017-03-22 LAB — GLUCOSE, CAPILLARY
GLUCOSE-CAPILLARY: 95 mg/dL (ref 65–99)
GLUCOSE-CAPILLARY: 125 mg/dL — AB (ref 65–99)
Glucose-Capillary: 139 mg/dL — ABNORMAL HIGH (ref 65–99)
Glucose-Capillary: 78 mg/dL (ref 65–99)
Glucose-Capillary: 83 mg/dL (ref 65–99)

## 2017-03-22 LAB — CBC
HCT: 41.2 % (ref 36.0–46.0)
HEMOGLOBIN: 13.2 g/dL (ref 12.0–15.0)
MCH: 28.7 pg (ref 26.0–34.0)
MCHC: 32 g/dL (ref 30.0–36.0)
MCV: 89.6 fL (ref 78.0–100.0)
Platelets: 206 10*3/uL (ref 150–400)
RBC: 4.6 MIL/uL (ref 3.87–5.11)
RDW: 14.7 % (ref 11.5–15.5)
WBC: 12.6 10*3/uL — AB (ref 4.0–10.5)

## 2017-03-22 LAB — BASIC METABOLIC PANEL
ANION GAP: 9 (ref 5–15)
BUN: 14 mg/dL (ref 6–20)
CALCIUM: 8.8 mg/dL — AB (ref 8.9–10.3)
CHLORIDE: 106 mmol/L (ref 101–111)
CO2: 27 mmol/L (ref 22–32)
Creatinine, Ser: 0.8 mg/dL (ref 0.44–1.00)
GFR calc non Af Amer: >60 mL/min (ref >60)
GLUCOSE: 107 mg/dL — AB (ref 65–99)
POTASSIUM: 4 mmol/L (ref 3.5–5.1)
Sodium: 142 mmol/L (ref 135–145)

## 2017-03-22 NOTE — Progress Notes (Signed)
Patient ID: Courtney Dorsey, female   DOB: 09-06-1945, 71 y.o.   MRN: [id number] [facility] Progress Note:   1 Day Post-Op  Subjective: Mental status is clear.  Having pain at site of gastropexy Objective: Vital signs in last 24 hours: Temp:  [97.9 F (36.6 C)-98.6 F (37 C)] 98 F (36.7 C) (07/21 0506) Pulse Rate:  [54-83] 55 (07/21 0506) Resp:  [12-18] 18 (07/21 0506) BP: (95-157)/(56-79) 125/62 (07/21 0506) SpO2:  [91 %-100 %] 94 % (07/21 0506)  Intake/Output from previous day: 07/20 0701 - 07/21 0700 In: 2903.3 [I.V.:2903.3] Out: 1130 [Urine:1100; Blood:30] Intake/Output this shift: No intake/output data recorded.  Physical Exam: Work of breathing is normal.  Abdominal exam flat and area of her concern was the site of gastropexy.    Lab Results:  Results for orders placed or performed during the hospital encounter of 03/21/17 (from the past 48 hour(s))  Glucose, capillary     Status: Abnormal   Collection Time: 03/21/17  5:34 AM  Result Value Ref Range   Glucose-Capillary 114 (H) 65 - 99 mg/dL  Glucose, capillary     Status: Abnormal   Collection Time: 03/21/17 10:28 AM  Result Value Ref Range   Glucose-Capillary 154 (H) 65 - 99 mg/dL  CBC     Status: Abnormal   Collection Time: 03/21/17 11:37 AM  Result Value Ref Range   WBC 19.6 (H) 4.0 - 10.5 K/uL   RBC 4.89 3.87 - 5.11 MIL/uL   Hemoglobin 14.4 12.0 - 15.0 g/dL   HCT 43.8 36.0 - 46.0 %   MCV 89.6 78.0 - 100.0 fL   MCH 29.4 26.0 - 34.0 pg   MCHC 32.9 30.0 - 36.0 g/dL   RDW 14.6 11.5 - 15.5 %   Platelets 200 150 - 400 K/uL  Creatinine, serum     Status: None   Collection Time: 03/21/17 11:37 AM  Result Value Ref Range   Creatinine, Ser 0.81 0.44 - 1.00 mg/dL   GFR calc non Af Amer >60 >60 mL/min   GFR calc Af Amer >60 >60 mL/min    Comment: (NOTE) The eGFR has been calculated using the CKD EPI equation. This calculation has not been validated in all clinical situations. eGFR's persistently <60  mL/min signify possible Chronic Kidney Disease.   Glucose, capillary     Status: Abnormal   Collection Time: 03/21/17 12:02 PM  Result Value Ref Range   Glucose-Capillary 158 (H) 65 - 99 mg/dL  Glucose, capillary     Status: Abnormal   Collection Time: 03/21/17  4:03 PM  Result Value Ref Range   Glucose-Capillary 156 (H) 65 - 99 mg/dL  Glucose, capillary     Status: Abnormal   Collection Time: 03/21/17  8:08 PM  Result Value Ref Range   Glucose-Capillary 128 (H) 65 - 99 mg/dL  Glucose, capillary     Status: Abnormal   Collection Time: 03/21/17 11:53 PM  Result Value Ref Range   Glucose-Capillary 107 (H) 65 - 99 mg/dL  CBC     Status: Abnormal   Collection Time: 03/22/17  5:43 AM  Result Value Ref Range   WBC 12.6 (H) 4.0 - 10.5 K/uL   RBC 4.60 3.87 - 5.11 MIL/uL   Hemoglobin 13.2 12.0 - 15.0 g/dL   HCT 41.2 36.0 - 46.0 %   MCV 89.6 78.0 - 100.0 fL   MCH 28.7 26.0 - 34.0 pg   MCHC 32.0 30.0 - 36.0 g/dL   RDW 14.7  11.5 - 15.5 %   Platelets 206 150 - 400 K/uL  Basic metabolic panel     Status: Abnormal   Collection Time: 03/22/17  5:43 AM  Result Value Ref Range   Sodium 142 135 - 145 mmol/L   Potassium 4.0 3.5 - 5.1 mmol/L   Chloride 106 101 - 111 mmol/L   CO2 27 22 - 32 mmol/L   Glucose, Bld 107 (H) 65 - 99 mg/dL   BUN 14 6 - 20 mg/dL   Creatinine, Ser 0.80 0.44 - 1.00 mg/dL   Calcium 8.8 (L) 8.9 - 10.3 mg/dL   GFR calc non Af Amer >60 >60 mL/min   GFR calc Af Amer >60 >60 mL/min    Comment: (NOTE) The eGFR has been calculated using the CKD EPI equation. This calculation has not been validated in all clinical situations. eGFR's persistently <60 mL/min signify possible Chronic Kidney Disease.    Anion gap 9 5 - 15  Glucose, capillary     Status: None   Collection Time: 03/22/17  8:09 AM  Result Value Ref Range   Glucose-Capillary 95 65 - 99 mg/dL    Radiology/Results: No results found.  Anti-infectives: Anti-infectives    Start     Dose/Rate Route Frequency  Ordered Stop   03/21/17 1600  ceFAZolin (ANCEF) IVPB 2g/100 mL premix     2 g 200 mL/hr over 30 Minutes Intravenous Every 8 hours 03/21/17 1124 03/21/17 1700   03/21/17 0645  cefoTEtan in Dextrose 5% (CEFOTAN) 2-2.08 GM-% IVPB  Status:  Discontinued    Comments:  Sheria Lang   : cabinet override      03/21/17 0645 03/21/17 0708   03/21/17 0600  cefoTEtan in Dextrose 5% (CEFOTAN) IVPB 2 g     2 g Intravenous On call to O.R. 03/21/17 0536 03/21/17 0740      Assessment/Plan: Problem List: Patient Active Problem List   Diagnosis Date Noted  . Large hiatal hernia 03/21/2017  . Syncope 11/24/2016  . Acute right flank pain 11/07/2016  . Constipation 11/07/2016  . Abdominal wall bulge 11/07/2016  . Status post laparoscopic Nissen fundoplication and repair of large type III hiatal hernia Feb 2017 10/10/2015  . IDA (iron deficiency anemia) 01/29/2013  . Microcytic anemia 12/31/2012  . Diabetes mellitus type 2, controlled ([place]) 12/31/2012    Begin clear liquids and observe today.   1 Day Post-Op    LOS: 1 day   Matt B. Hassell Done, MD, [facility] [phone] beeper [phone]  03/22/2017 9:59 AM

## 2017-03-23 LAB — GLUCOSE, CAPILLARY
GLUCOSE-CAPILLARY: 90 mg/dL (ref 65–99)
Glucose-Capillary: 101 mg/dL — ABNORMAL HIGH (ref 65–99)

## 2017-03-23 MED ORDER — HYDROCODONE-ACETAMINOPHEN 5-325 MG PO TABS: 1.0000 | ORAL_TABLET | ORAL | 0 refills | Status: DC | PRN

## 2017-03-23 MED ORDER — ONDANSETRON 4 MG PO TBDP: 4.0000 mg | ORAL_TABLET | Freq: Four times a day (QID) | ORAL | 0 refills | Status: DC | PRN

## 2017-03-23 NOTE — Discharge Summary (Signed)
Physician Discharge Summary  Patient ID: Courtney Dorsey MRN: [id number] DOB/AGE: 10-Oct-1945 71 y.o.  Admit date: 03/21/2017 Discharge date: 03/23/2017  Admission Diagnoses:  Dysphagia after Nissen fundoplication with recurrent hiatal hernia  Discharge Diagnoses:  same  Active Problems:   Large hiatal hernia   Surgery:  Foregut dissection and partial reduction of hernia; no closure of diaphragm and gastropexy  Discharged Condition: improved  Hospital Course:   Had surgery on Friday;  Pain at gastropexy site on Saturday but got along well.  Ready for discharge on Sunday.  Consults: none  Significant Diagnostic Studies: none    Discharge Exam: Blood pressure (!) 149/65, pulse (!) 57, temperature 98 F (36.7 C), temperature source Oral, resp. rate 18, height 5\' 2"  (1.575 m), weight 65.8 kg (145 lb), SpO2 93 %. Incisions OK  Disposition: 01-Home or Self Care  Discharge Instructions    Call MD for:  redness, tenderness, or signs of infection (pain, swelling, redness, odor or green/yellow discharge around incision site)    Complete by:  As directed    Call MD for:  severe uncontrolled pain    Complete by:  As directed    Diet - low sodium heart healthy    Complete by:  As directed    Discharge instructions    Complete by:  As directed    Advance diet over the next 7 days to regular diet.  Chew food thoroughly   Increase activity slowly    Complete by:  As directed    No wound care    Complete by:  As directed      Allergies as of 03/23/2017   No Known Allergies     Medication List    TAKE these medications   acetaminophen 500 MG tablet Commonly known as:  TYLENOL Take 1,000 mg by mouth daily as needed for headache.   aspirin 81 MG tablet Take 81 mg by mouth every morning.   atorvastatin 80 MG tablet Commonly known as:  LIPITOR Take 40 mg by mouth every evening.   diphenhydramine-acetaminophen 25-500 MG Tabs tablet Commonly known as:  TYLENOL PM Take 1  tablet by mouth at bedtime as needed (sleep).   enalapril 10 MG tablet Commonly known as:  VASOTEC Take 10 mg by mouth 2 (two) times daily.   GAS-X PO Take 1-2 tablets by mouth 2 (two) times daily as needed (stomach irritation).   HYDROcodone-acetaminophen 5-325 MG tablet Commonly known as:  NORCO/VICODIN Take 1-2 tablets by mouth every 4 (four) hours as needed for moderate pain.   ketoconazole 2 % cream Commonly known as:  NIZORAL Apply 1 application topically daily as needed for irritation (behind ear.).   metFORMIN 500 MG tablet Commonly known as:  GLUCOPHAGE Take 250 mg by mouth 2 (two) times daily.   ondansetron 4 MG disintegrating tablet Commonly known as:  ZOFRAN-ODT Take 1 tablet (4 mg total) by mouth every 6 (six) hours as needed for nausea.   ranitidine 150 MG tablet Commonly known as:  ZANTAC Take 300 mg by mouth daily as needed for heartburn.      Follow-up Information    Johnathan Hausen, MD Follow up in 4 week(s).   Specialty:  General Surgery Contact information: [address] [address] [phone]           Signed: Pedro Earls 03/23/2017, 8:15 AM

## 2017-03-23 NOTE — Progress Notes (Signed)
Pt ambulating often in the hallway, tolerating regular diet. Prescription and d/c instructions were given and understanding was verbalized.

## 2017-03-23 NOTE — Discharge Instructions (Signed)
May shower Over the next week advance from full liquid diet to a regular diet.  Chew food thoroughly

## 2017-04-09 ENCOUNTER — Encounter: Payer: Self-pay | Admitting: Neurology

## 2017-04-09 ENCOUNTER — Ambulatory Visit (INDEPENDENT_AMBULATORY_CARE_PROVIDER_SITE_OTHER): Payer: Medicare Other | Admitting: Neurology

## 2017-04-09 VITALS — BP 143/81 | HR 94 | Ht 62.0 in | Wt 144.0 lb

## 2017-04-09 DIAGNOSIS — R55 Syncope and collapse: Secondary | ICD-10-CM | POA: Diagnosis not present

## 2017-04-09 NOTE — Progress Notes (Signed)
PATIENT: Courtney Dorsey DOB: [birth year]  Chief Complaint  Patient presents with  . New Patient (Initial Visit)    REFERRAL BY DR. Hilma Favors  "Pt stated pass out/light headed episode.'' MRI result brought by pt.     HISTORICAL  Courtney Dorsey is a 71 years old right-handed female, seen in refer by her primary care doctor Sharilyn Sites, for evaluation of passing out episode, initial evaluation was on April 09 2017.  She had a past medical history of hypertension, diabetes, hyperlipidemia, previous history of hiatal hernia.  Because of her severe hiatal hernia, she eventually had laparoscopic Nissen fundoplication and repair of large type III hiatal hernia in February 2017.  Prior to the hiatal hernia repair, she has occasionally food stuck while swallowing, this will induce nausea vomiting, but since the Nissen fundoplication, she could only dry heaves without throwing up.  In March 2018, during few weeks history of GI discomfort including diarrhea, nausea, she has developed frequent dry heaves without throwing up, during wine episode of intense dry heaves, she had transient loss of consciousness, there was no seizure reported.   Reevaluation with MRI of abdomen in March 2018 showed moderate to large hiatal hernia, this has led to her most recent surgery on March 21 2017, laparoscopic enteroclysis, upper endoscopy, gastropexy, for recurrent large hiatal defect, esophagus gastric junction was up in the mediastinum  Since the second laparoscopic surgery in July 2018, her GI symptoms has much improved, she no longer has frequent recurrent nausea, there was only one passing out episode on March 26 2017, again it is associated with prolonged dry heaves, there was no seizure activity noted.   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 acetaminophen (TYLENOL) 500 MG tablet Take 1,000 mg by mouth daily as needed for headache.    Marland Kitchen aspirin 81 MG tablet Take 81 mg by mouth every morning.     Marland Kitchen atorvastatin (LIPITOR) 80 MG tablet Take 40 mg by mouth every evening.    . diphenhydramine-acetaminophen (TYLENOL PM) 25-500 MG TABS tablet Take 1 tablet by mouth at bedtime as needed (sleep).    . enalapril (VASOTEC) 10 MG tablet Take 10 mg by mouth 2 (two) times daily.    Marland Kitchen ketoconazole (NIZORAL) 2 % cream Apply 1 application topically daily as needed for irritation (behind ear.).    Marland Kitchen metFORMIN (GLUCOPHAGE) 500 MG tablet Take 250 mg by mouth 2 (two) times daily.     . ondansetron (ZOFRAN-ODT) 4 MG disintegrating tablet Take 1 tablet (4 mg total) by mouth every 6 (six) hours as needed for nausea. 20 tablet 0  . ranitidine (ZANTAC) 150 MG tablet Take 300 mg by mouth daily as needed for heartburn.     . Simethicone (GAS-X PO) Take 1-2 tablets by mouth 2 (two) times daily  as needed (stomach irritation).    Marland Kitchen HYDROcodone-acetaminophen (NORCO/VICODIN) 5-325 MG tablet TAKE 1 TO 2 TABLETS BY MOUTH EVERY 4 HOURS AS NEEDED FOR MODERATE PAIN  0   No current facility-administered medications for this visit.     PAST MEDICAL HISTORY: Past Medical History:  Diagnosis Date  . Anemia   . Diabetes mellitus without complication ([place])    "BORDERLINE"  . Diverticulosis   . Heart murmur    childhood   . History of  hiatal hernia   . History of skin cancer   . History of transfusion   . Hypercholesteremia   . Hypertension   . UTI (lower urinary tract infection)    TAKING ANTIBIOTICS    PAST SURGICAL HISTORY: Past Surgical History:  Procedure Laterality Date  . CHOLECYSTECTOMY    . COLONOSCOPY  2009   Dr. Aviva Signs  . COLONOSCOPY N/A 01/01/2013   NLZ:JQBHAL mucosa in the terminal ileum  Mild diverticulosis in the descending colon and sigmoid colon and sigmoid colon/Moderate sized internal hemorrhoids  . ESOPHAGOGASTRODUODENOSCOPY N/A 01/01/2013   PFX:TKWIO hiatal hernia/ MODERATE SIZE PARA-ESOPHAGEAL HERNIA/MILD Erosive gastritis. chronic duodenitis c/w peptic duodenitis. no h.pylori or villous atrophy. minimal chronic gastritis.   Marland Kitchen ESOPHAGOGASTRODUODENOSCOPY N/A 02/16/2013   Procedure: ESOPHAGOGASTRODUODENOSCOPY (EGD);  Surgeon: Daneil Dolin, MD;  [facility];  Service: Endoscopy;  Laterality: N/A;  8:30  . GIVENS CAPSULE STUDY N/A 02/16/2013   EGD with capsule placement. no evidence of paraesophageal hernia. SB essentially unremarkable.  Marland Kitchen HIATAL HERNIA REPAIR N/A 03/21/2017   Procedure: LAPAROSCOPIC lysis of adhesions,reduction hiatal hernia, upper endoscopy,gastropexy;  Surgeon: Johnathan Hausen, MD;  Location: [facility];  Service: General;  Laterality: N/A;  . KNOT REMOVED FROM SCALP    . LAPAROSCOPIC NISSEN FUNDOPLICATION N/A 05/10/3531   Procedure: LAPAROSCOPIC NISSEN FUNDOPLICATION AND HIATAL HERNIA REPAIR;  Surgeon: Johnathan Hausen, MD;  Location: [facility];  Service: General;  Laterality: N/A;    FAMILY HISTORY: Family History  Problem Relation Age of Onset  . Colon cancer Mother        diagnosed around age 12  . Colon cancer Brother        age 56  . Diabetes Sister   . Diabetes Brother   . Liver disease Neg Hx   . Lung cancer Neg Hx   . Breast cancer Neg Hx   . Ovarian cancer Neg Hx   . Celiac disease Neg Hx     SOCIAL HISTORY:  Social History   Social History  .  Marital status: Married    Spouse name: N/A  . Number of children: 1  . Years of education: N/A   Occupational History  .  Retired   Social History Main Topics  . Smoking status: Current Every Day Smoker    Packs/day: 1.00    Years: 28.00    Types: Cigarettes  . Smokeless tobacco: Never Used  . Alcohol use No  . Drug use: No  . Sexual activity: Yes    Birth control/ protection: Post-menopausal   Other Topics Concern  . Not on file   Social History Narrative   Pt live with spouse. Retired.  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    4 cups caffeine daily.       PHYSICAL EXAM   Vitals:   04/09/17 1004  BP: (!) 143/81  Pulse: 94  Weight: 144 lb (65.3 kg)  Height: 5\' 2"  (1.575 m)    Not recorded      Body  mass index is 26.34 kg/m.  PHYSICAL EXAMNIATION:  Gen: NAD, conversant, well nourised, obese, well groomed                     Cardiovascular: Regular rate rhythm, no peripheral edema, warm, nontender. Eyes: Conjunctivae clear without exudates or hemorrhage Neck: Supple, no carotid bruits. Pulmonary: Clear to auscultation bilaterally   NEUROLOGICAL EXAM:  MENTAL STATUS: Speech:    Speech is normal; fluent and spontaneous with normal comprehension.  Cognition:     Orientation to time, place and person     Normal recent and remote memory     Normal Attention span and concentration     Normal Language, naming, repeating,spontaneous speech     Fund of knowledge   CRANIAL NERVES: CN II: Visual fields are full to confrontation. Fundoscopic exam is normal with sharp discs and no vascular changes. Pupils are round equal and briskly reactive to light. CN III, IV, VI: extraocular movement are normal. No ptosis. CN V: Facial sensation is intact to pinprick in all 3 divisions bilaterally. Corneal responses are intact.  CN VII: Face is symmetric with normal eye closure and smile. CN VIII: Hearing is normal to rubbing fingers CN IX, X: Palate elevates symmetrically. Phonation  is normal. CN XI: Head turning and shoulder shrug are intact CN XII: Tongue is midline with normal movements and no atrophy.  MOTOR: There is no pronator drift of out-stretched arms. Muscle bulk and tone are normal. Muscle strength is normal.  REFLEXES: Reflexes are 2+ and symmetric at the biceps, triceps, knees, and ankles. Plantar responses are flexor.  SENSORY: Intact to light touch, pinprick, positional sensation and vibratory sensation are intact in fingers and toes.  COORDINATION: Rapid alternating movements and fine finger movements are intact. There is no dysmetria on finger-to-nose and heel-knee-shin.    GAIT/STANCE: Posture is normal. Gait is steady with normal steps, base, arm swing, and turning. Heel and toe walking are normal. Tandem gait is normal.  Romberg is absent.   DIAGNOSTIC DATA (LABS, IMAGING, TESTING) - I reviewed patient records, labs, notes, testing and imaging myself where available.   ASSESSMENT AND PLAN  LUVERTA KORTE is a 71 y.o. female   Syncope  Related to her prolonged dry heaves, esophagus gastric junction was up in the mediastinum, strong vasovagal stimulation   her symptoms has much improved following her recent laparoscopic hiatal hernia repair procedure  Call office for recurrent symptoms,  Marcial Pacas, M.D. Ph.D.  [facility] [address], [place], [place] [address] Ph: [phone] Fax: [phone]  CC: Sharilyn Sites, MD

## 2017-04-15 DIAGNOSIS — E119 Type 2 diabetes mellitus without complications: Secondary | ICD-10-CM | POA: Diagnosis not present

## 2017-06-06 DIAGNOSIS — Z1389 Encounter for screening for other disorder: Secondary | ICD-10-CM | POA: Diagnosis not present

## 2017-06-06 DIAGNOSIS — E119 Type 2 diabetes mellitus without complications: Secondary | ICD-10-CM | POA: Diagnosis not present

## 2017-06-06 DIAGNOSIS — E663 Overweight: Secondary | ICD-10-CM | POA: Diagnosis not present

## 2017-06-06 DIAGNOSIS — Z6828 Body mass index (BMI) 28.0-28.9, adult: Secondary | ICD-10-CM | POA: Diagnosis not present

## 2017-06-06 DIAGNOSIS — Z23 Encounter for immunization: Secondary | ICD-10-CM | POA: Diagnosis not present

## 2017-06-06 DIAGNOSIS — E785 Hyperlipidemia, unspecified: Secondary | ICD-10-CM | POA: Diagnosis not present

## 2017-06-06 DIAGNOSIS — E782 Mixed hyperlipidemia: Secondary | ICD-10-CM | POA: Diagnosis not present

## 2017-06-06 DIAGNOSIS — D509 Iron deficiency anemia, unspecified: Secondary | ICD-10-CM | POA: Diagnosis not present

## 2017-06-06 DIAGNOSIS — K449 Diaphragmatic hernia without obstruction or gangrene: Secondary | ICD-10-CM | POA: Diagnosis not present

## 2017-06-09 DIAGNOSIS — R739 Hyperglycemia, unspecified: Secondary | ICD-10-CM | POA: Diagnosis not present

## 2017-06-09 DIAGNOSIS — E119 Type 2 diabetes mellitus without complications: Secondary | ICD-10-CM | POA: Diagnosis not present

## 2017-06-09 DIAGNOSIS — E785 Hyperlipidemia, unspecified: Secondary | ICD-10-CM | POA: Diagnosis not present

## 2017-06-09 DIAGNOSIS — Z1389 Encounter for screening for other disorder: Secondary | ICD-10-CM | POA: Diagnosis not present

## 2017-06-09 DIAGNOSIS — E782 Mixed hyperlipidemia: Secondary | ICD-10-CM | POA: Diagnosis not present

## 2017-06-09 NOTE — Progress Notes (Signed)
Cardiology Office Note   Date:  06/10/2017   ID:  Courtney Dorsey, DOB 1946/05/20, MRN [id number]  PCP:  Sharilyn Sites, MD  Cardiologist:   Jenkins Rouge, MD   No chief complaint on file.     History of Present Illness: Courtney Dorsey is a 71 y.o. female initially seen June 2018  regarding syncope. Referred by Dr [facility].  In March seen in ER for "black out" Noted recurrence 6/9 and twice on 6/10. BP noted 95/57 and pulse 44 patient used her own cuff. After a few minutes BP 147/78 and pulse 78 Has family history of seizures. Sitting during blackouts Associated with dry heaves Previous fundoplication and cannot vomit. Needs surgical revision. No associated chest pain palpitations dyspnea diaphoresis. Did not injure self No post ictal state or confusion.  She indicates first episode from dehydration and dry heaves 2nd episode she was raising hands over head to put something on shelf  No chest pain palpitations diaphoresis or dyspnea    Reviewed echo from 03/03/17: EF 60-65% no significant valve disease   Had revision of bariatric surgery with Dr Hassell Done in July with foregut dissection and partial reduction of hernia done laparoscopically  Still having some crampy epigastric pain and this elicits some dizziness She has app on her phone and HR has been fine In 60's at night and 80-95 during day   Past Medical History:  Diagnosis Date  . Anemia   . Diabetes mellitus without complication ([place])    "BORDERLINE"  . Diverticulosis   . Heart murmur    childhood   . History of hiatal hernia   . History of skin cancer   . History of transfusion   . Hypercholesteremia   . Hypertension   . UTI (lower urinary tract infection)    TAKING ANTIBIOTICS    Past Surgical History:  Procedure Laterality Date  . CHOLECYSTECTOMY    . COLONOSCOPY  2009   Dr. Aviva Signs  . COLONOSCOPY N/A 01/01/2013   YBO:FBPZWC mucosa in the terminal ileum  Mild diverticulosis in the  descending colon and sigmoid colon and sigmoid colon/Moderate sized internal hemorrhoids  . ESOPHAGOGASTRODUODENOSCOPY N/A 01/01/2013   HEN:IDPOE hiatal hernia/ MODERATE SIZE PARA-ESOPHAGEAL HERNIA/MILD Erosive gastritis. chronic duodenitis c/w peptic duodenitis. no h.pylori or villous atrophy. minimal chronic gastritis.   Marland Kitchen ESOPHAGOGASTRODUODENOSCOPY N/A 02/16/2013   Procedure: ESOPHAGOGASTRODUODENOSCOPY (EGD);  Surgeon: Daneil Dolin, MD;  [facility];  Service: Endoscopy;  Laterality: N/A;  8:30  . GIVENS CAPSULE STUDY N/A 02/16/2013   EGD with capsule placement. no evidence of paraesophageal hernia. SB essentially unremarkable.  Marland Kitchen HIATAL HERNIA REPAIR N/A 03/21/2017   Procedure: LAPAROSCOPIC lysis of adhesions,reduction hiatal hernia, upper endoscopy,gastropexy;  Surgeon: Johnathan Hausen, MD;  Location: [facility];  Service: General;  Laterality: N/A;  . KNOT REMOVED FROM SCALP    . LAPAROSCOPIC NISSEN FUNDOPLICATION N/A 12/03/3534   Procedure: LAPAROSCOPIC NISSEN FUNDOPLICATION AND HIATAL HERNIA REPAIR;  Surgeon: Johnathan Hausen, MD;  Location: [facility];  Service: General;  Laterality: N/A;     Current Outpatient Prescriptions  Medication Sig Dispense Refill  . acetaminophen (TYLENOL) 500 MG tablet Take 1,000 mg by mouth daily as needed for headache.    Marland Kitchen aspirin 81 MG tablet Take 81 mg by mouth every morning.     Marland Kitchen atorvastatin (LIPITOR) 80 MG tablet Take 40 mg by mouth every evening.    . diphenhydramine-acetaminophen (TYLENOL PM) 25-500 MG TABS tablet Take 1 tablet by  mouth at bedtime as needed (sleep).    . enalapril (VASOTEC) 10 MG tablet Take 10 mg by mouth 2 (two) times daily.    Marland Kitchen HYDROcodone-acetaminophen (NORCO/VICODIN) 5-325 MG tablet TAKE 1 TO 2 TABLETS BY MOUTH EVERY 4 HOURS AS NEEDED FOR MODERATE PAIN  0  . ketoconazole (NIZORAL) 2 % cream Apply 1 application topically daily as needed for irritation (behind ear.).    Marland Kitchen metFORMIN (GLUCOPHAGE) 500 MG tablet Take 250 mg by  mouth 2 (two) times daily.     . ondansetron (ZOFRAN-ODT) 4 MG disintegrating tablet Take 1 tablet (4 mg total) by mouth every 6 (six) hours as needed for nausea. 20 tablet 0  . ranitidine (ZANTAC) 150 MG tablet Take 300 mg by mouth daily as needed for heartburn.     . Simethicone (GAS-X PO) Take 1-2 tablets by mouth 2 (two) times daily as needed (stomach irritation).     No current facility-administered medications for this visit.     Allergies:   Patient has no known allergies.    Social History:  The patient  reports that she has been smoking Cigarettes.  She has a 28.00 pack-year smoking history. She has never used smokeless tobacco. She reports that she does not drink alcohol or use drugs.   Family History:  The patient's family history includes Colon cancer in her brother and mother; Diabetes in her brother and sister.    ROS:  Please see the history of present illness.   Otherwise, review of systems are positive for none.   All other systems are reviewed and negative.    PHYSICAL EXAM: VS:  BP (!) 154/80 (BP Location: Right Arm)   Pulse 84   Ht 5\' 2"  (1.575 m)   Wt 150 lb (68 kg)   SpO2 98%   BMI 27.44 kg/m  , BMI Body mass index is 27.44 kg/m. Affect appropriate Healthy:  appears stated age 64: normal Neck supple with no adenopathy JVP normal no bruits no thyromegaly Lungs clear with no wheezing and good diaphragmatic motion Heart:  S1/S2SEM radiating to base of right carotid   no rub, gallop or click PMI normal Abdomen: benighn, BS positve, no tenderness, no AAA post lap bariatric surgery  no bruit.  No HSM or HJR Distal pulses intact with no bruits No edema Neuro non-focal Skin warm and dry No muscular weakness    EKG:  SR septal infarct otherwise normal    Recent Labs: 11/24/2016: ALT 24 03/22/2017: BUN 14; Creatinine, Ser 0.80; Hemoglobin 13.2; Platelets 206; Potassium 4.0; Sodium 142    Lipid Panel No results found for: CHOL, TRIG, HDL, CHOLHDL,  VLDL, LDLCALC, LDLDIRECT    Wt Readings from Last 3 Encounters:  06/10/17 150 lb (68 kg)  04/09/17 144 lb (65.3 kg)  03/21/17 145 lb (65.8 kg)      Other studies Reviewed: Additional studies/ records that were reviewed today include: Notes from primary ECG and ER visit  With labs .    ASSESSMENT AND PLAN:  1.  Syncope doubt cardiac etiology echo with normal EF and no structural heart disease  Consider f/u with EP for loop recorder if she has another episode that is clearly not precipitated By vagal inputs or position changes 2. HTN: Well controlled.  Continue current medications and low sodium Dash type diet.   3. Chol: continue statin labs with primary  4. GI:  F/u Dr Hassell Done post revision of bariatric surgery   Jenkins Rouge

## 2017-06-10 ENCOUNTER — Ambulatory Visit (INDEPENDENT_AMBULATORY_CARE_PROVIDER_SITE_OTHER): Payer: Medicare Other | Admitting: Cardiovascular Disease

## 2017-06-10 ENCOUNTER — Encounter: Payer: Self-pay | Admitting: Cardiovascular Disease

## 2017-06-10 VITALS — BP 154/80 | HR 84 | Ht 62.0 in | Wt 150.0 lb

## 2017-06-10 DIAGNOSIS — R55 Syncope and collapse: Secondary | ICD-10-CM | POA: Diagnosis not present

## 2017-06-10 NOTE — Patient Instructions (Signed)

## 2017-06-12 ENCOUNTER — Other Ambulatory Visit (HOSPITAL_COMMUNITY): Payer: Self-pay | Admitting: Family Medicine

## 2017-06-12 DIAGNOSIS — Z1231 Encounter for screening mammogram for malignant neoplasm of breast: Secondary | ICD-10-CM

## 2017-06-18 ENCOUNTER — Ambulatory Visit (HOSPITAL_COMMUNITY)
Admission: RE | Admit: 2017-06-18 | Discharge: 2017-06-18 | Disposition: A | Discharge status: Home / Self Care | Payer: Medicare Other | Source: Ambulatory Visit | Attending: Family Medicine | Admitting: Family Medicine

## 2017-06-18 DIAGNOSIS — Z1231 Encounter for screening mammogram for malignant neoplasm of breast: Secondary | ICD-10-CM | POA: Diagnosis not present

## 2017-07-29 DIAGNOSIS — E782 Mixed hyperlipidemia: Secondary | ICD-10-CM | POA: Diagnosis not present

## 2017-07-29 DIAGNOSIS — Z0001 Encounter for general adult medical examination with abnormal findings: Secondary | ICD-10-CM | POA: Diagnosis not present

## 2017-07-29 DIAGNOSIS — D509 Iron deficiency anemia, unspecified: Secondary | ICD-10-CM | POA: Diagnosis not present

## 2017-07-29 DIAGNOSIS — Z6827 Body mass index (BMI) 27.0-27.9, adult: Secondary | ICD-10-CM | POA: Diagnosis not present

## 2017-07-29 DIAGNOSIS — I10 Essential (primary) hypertension: Secondary | ICD-10-CM | POA: Diagnosis not present

## 2017-07-29 DIAGNOSIS — Z1389 Encounter for screening for other disorder: Secondary | ICD-10-CM | POA: Diagnosis not present

## 2017-07-29 DIAGNOSIS — E119 Type 2 diabetes mellitus without complications: Secondary | ICD-10-CM | POA: Diagnosis not present

## 2017-08-04 ENCOUNTER — Other Ambulatory Visit (HOSPITAL_COMMUNITY): Payer: Self-pay | Admitting: Family Medicine

## 2017-08-04 DIAGNOSIS — F17200 Nicotine dependence, unspecified, uncomplicated: Secondary | ICD-10-CM

## 2017-08-04 DIAGNOSIS — Z122 Encounter for screening for malignant neoplasm of respiratory organs: Secondary | ICD-10-CM

## 2017-08-25 ENCOUNTER — Other Ambulatory Visit (HOSPITAL_COMMUNITY): Payer: Self-pay | Admitting: Family Medicine

## 2017-08-25 DIAGNOSIS — F17200 Nicotine dependence, unspecified, uncomplicated: Secondary | ICD-10-CM

## 2017-10-29 DIAGNOSIS — Z6827 Body mass index (BMI) 27.0-27.9, adult: Secondary | ICD-10-CM | POA: Diagnosis not present

## 2017-10-29 DIAGNOSIS — J209 Acute bronchitis, unspecified: Secondary | ICD-10-CM | POA: Diagnosis not present

## 2017-10-29 DIAGNOSIS — E663 Overweight: Secondary | ICD-10-CM | POA: Diagnosis not present

## 2017-10-29 DIAGNOSIS — Z1389 Encounter for screening for other disorder: Secondary | ICD-10-CM | POA: Diagnosis not present

## 2017-10-29 DIAGNOSIS — J22 Unspecified acute lower respiratory infection: Secondary | ICD-10-CM | POA: Diagnosis not present

## 2017-11-04 ENCOUNTER — Encounter: Payer: Self-pay | Admitting: Internal Medicine

## 2017-12-29 DIAGNOSIS — E119 Type 2 diabetes mellitus without complications: Secondary | ICD-10-CM | POA: Diagnosis not present

## 2017-12-29 DIAGNOSIS — Z6827 Body mass index (BMI) 27.0-27.9, adult: Secondary | ICD-10-CM | POA: Diagnosis not present

## 2017-12-29 DIAGNOSIS — D510 Vitamin B12 deficiency anemia due to intrinsic factor deficiency: Secondary | ICD-10-CM | POA: Diagnosis not present

## 2017-12-29 DIAGNOSIS — I10 Essential (primary) hypertension: Secondary | ICD-10-CM | POA: Diagnosis not present

## 2017-12-29 DIAGNOSIS — E663 Overweight: Secondary | ICD-10-CM | POA: Diagnosis not present

## 2017-12-29 DIAGNOSIS — D509 Iron deficiency anemia, unspecified: Secondary | ICD-10-CM | POA: Diagnosis not present

## 2018-01-29 ENCOUNTER — Encounter: Payer: Self-pay | Admitting: Neurology

## 2018-02-26 ENCOUNTER — Other Ambulatory Visit: Payer: Self-pay

## 2018-02-26 ENCOUNTER — Ambulatory Visit (INDEPENDENT_AMBULATORY_CARE_PROVIDER_SITE_OTHER): Payer: Medicare Other | Admitting: Neurology

## 2018-02-26 ENCOUNTER — Encounter: Payer: Self-pay | Admitting: Neurology

## 2018-02-26 VITALS — BP 150/82 | HR 99 | Ht 62.0 in | Wt 145.0 lb

## 2018-02-26 DIAGNOSIS — R55 Syncope and collapse: Secondary | ICD-10-CM

## 2018-02-26 DIAGNOSIS — E237 Disorder of pituitary gland, unspecified: Secondary | ICD-10-CM

## 2018-02-26 NOTE — Progress Notes (Signed)
NEUROLOGY CONSULTATION NOTE  Courtney Dorsey MRN: [id number] DOB: Jun 26, 1946  Referring provider: Dr. Sharilyn Sites Primary care provider: Dr. Sharilyn Sites  Reason for consult:  Near syncope, pituitary abnormality  Dear Dr Hilma Favors:  Thank you for your kind referral of Courtney Dorsey for consultation of the above symptoms. Although her history is well known to you, please allow me to reiterate it for the purpose of our medical record. She is alone in the office today. Records and images were personally reviewed where available.  HISTORY OF PRESENT ILLNESS: This is a pleasant 72 year old right-handed woman with a history of hypertension, hyperlipidemia, hiatal hernia surgery x 2, presenting for evaluation of recurrent syncope and presyncope. She had hiatal hernia surgery in 10/2015 and reports that since the surgery, she could not vomit but would have significant dry heaving. In March 2018 she was having bad dry heaving and was feeling like she would pass out, then apparently lost consciousness three times in a 2.5 hour time period. She was discharged home with a diagnosis of vasovagal syncope. She had repeat hernia surgery in 03/2017. She brings a calendar of further episodes, she denies completely passing out since February 2019, but has had near syncope occurring in clusters for a day every month since then. On 3/24 she had 6 or 7, on 4/10 she had 8, on 5/13 she had 2, on 6/4 she had 4. On 6/23, she had dry heaving the night prior to 2-3 hours, then later had 6 or 7 near syncopal episodes. Each would last only a few seconds to a minute, she would have a sensation in her chest area followed by weakness going down her legs. She denies any dizziness/spinning sensation, she takes deep breaths then the symptoms stop. No associated nausea, headaches, vision changes, focal numbness/tingling. No clear positional component. She denies any staring/unresponsive episodes or gaps in time. She does not get good  sleep, usually 5.5 hours, but has not noticed a clear pattern with the episodes. She drinks multiple cups of coffee daily. She denies any palpitations. No neck/back pain, bowel/bladder dysfunction. 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 She denies any cold/hot intolerance, skin changes, galactorrhea, vision changes to indicate pituitary dysfunction.    PAST MEDICAL HISTORY: Past Medical History:  Diagnosis Date  . Anemia   . Diabetes mellitus without complication ([place])    "BORDERLINE"  . Diverticulosis   . Heart murmur    childhood   . History of hiatal hernia   . History of skin cancer   . History of transfusion   . Hypercholesteremia   . Hypertension   . UTI (lower urinary tract infection)    TAKING ANTIBIOTICS    PAST SURGICAL HISTORY: Past Surgical History:  Procedure Laterality Date  . CHOLECYSTECTOMY    . COLONOSCOPY  2009   Dr. Aviva Signs  . COLONOSCOPY N/A 01/01/2013   UTM:LYYTKP mucosa in the terminal ileum  Mild diverticulosis in the descending colon and sigmoid colon and sigmoid colon/Moderate sized internal hemorrhoids  . ESOPHAGOGASTRODUODENOSCOPY N/A 01/01/2013   TWS:FKCLE hiatal hernia/ MODERATE SIZE PARA-ESOPHAGEAL HERNIA/MILD Erosive gastritis. chronic duodenitis c/w peptic duodenitis. no h.pylori or villous atrophy. minimal chronic gastritis.   Marland Kitchen ESOPHAGOGASTRODUODENOSCOPY N/A 02/16/2013   Procedure: ESOPHAGOGASTRODUODENOSCOPY (EGD);  Surgeon: Daneil Dolin, MD;  Location: [facility];  Service: Endoscopy;  Laterality: N/A;  8:30  . GIVENS CAPSULE STUDY N/A 02/16/2013   EGD with capsule placement. no evidence of paraesophageal hernia. SB essentially unremarkable.  Marland Kitchen HIATAL HERNIA REPAIR N/A 03/21/2017   Procedure: LAPAROSCOPIC lysis of adhesions,reduction hiatal hernia,  upper endoscopy,gastropexy;  Surgeon: Johnathan Hausen, MD;  Location: [facility] ORS;  Service: General;  Laterality: N/A;  . KNOT REMOVED FROM SCALP    . LAPAROSCOPIC NISSEN FUNDOPLICATION N/A 05/10/3381   Procedure: LAPAROSCOPIC NISSEN FUNDOPLICATION AND HIATAL HERNIA REPAIR;  Surgeon: Johnathan Hausen, MD;  Location: [facility];  Service: General;  Laterality: N/A;    MEDICATIONS: Current Outpatient Medications on File Prior to Visit  Medication Sig Dispense Refill  . acetaminophen (TYLENOL) 500 MG tablet Take 1,000 mg by mouth daily as needed for headache.    Marland Kitchen aspirin 81 MG tablet Take 81 mg by mouth every morning.     Marland Kitchen atorvastatin (LIPITOR) 80 MG tablet Take 40 mg by mouth every evening.    . diphenhydramine-acetaminophen (TYLENOL PM) 25-500 MG TABS tablet Take 1 tablet by mouth at bedtime as needed (sleep).    . enalapril (VASOTEC) 10 MG tablet Take 10 mg by mouth 2 (two) times daily.    Marland Kitchen HYDROcodone-acetaminophen (NORCO/VICODIN) 5-325 MG tablet TAKE 1 TO 2 TABLETS BY MOUTH EVERY 4 HOURS AS NEEDED FOR MODERATE PAIN  0  . ketoconazole (NIZORAL) 2 % cream Apply 1 application topically daily as needed for irritation (behind ear.).    Marland Kitchen metFORMIN (GLUCOPHAGE) 500 MG tablet Take 250 mg by mouth 2 (two) times daily.     . ondansetron (ZOFRAN-ODT) 4 MG disintegrating tablet Take 1 tablet (4 mg total) by mouth every 6 (six) hours as needed for nausea. 20 tablet 0  . ranitidine (ZANTAC) 150 MG tablet Take 300 mg by mouth daily as needed for heartburn.     . Simethicone (GAS-X PO) Take 1-2 tablets by mouth 2 (two) times daily as needed (stomach irritation).     No current facility-administered medications on file prior to visit.     ALLERGIES: No Known Allergies  FAMILY HISTORY: Family History  Problem Relation Age of Onset  . Colon cancer Mother        diagnosed around age 18  . Colon cancer Brother        age 35  . Diabetes Sister   . Diabetes Brother   . Liver disease Neg Hx   . Lung cancer  Neg Hx   . Breast cancer Neg Hx   . Ovarian cancer Neg Hx   . Celiac disease Neg Hx     SOCIAL HISTORY: Social History   Socioeconomic History  . Marital status: Married    Spouse name: Not on file  . Number of children: 1  . Years of education: Not on file  . Highest education level: Not on file  Occupational History    Employer: RETIRED  Social Needs  . Financial resource strain: Not on file  . Food insecurity:    Worry: Not on file    Inability: Not on file  . Transportation needs:    Medical: Not on file    Non-medical: Not on file  Tobacco Use  . Smoking status: Current Every Day Smoker    Packs/day: 1.00    Years: 28.00    Pack years: 28.00    Types: Cigarettes  . Smokeless tobacco: Never Used  Substance and Sexual Activity  . Alcohol use: No  . Drug use: No  .  Sexual activity: Yes    Birth control/protection: Post-menopausal  Lifestyle  . Physical activity:    Days per week: Not on file    Minutes per session: Not on file  . Stress: Not on file  Relationships  . Social connections:    Talks on phone: Not on file    Gets together: Not on file    Attends religious service: Not on file    Active member of club or organization: Not on file    Attends meetings of clubs or organizations: Not on file    Relationship status: Not on file  . Intimate partner violence:    Fear of current or ex partner: Not on file    Emotionally abused: Not on file    Physically abused: Not on file    Forced sexual activity: Not on file  Other Topics Concern  . Not on file  Social History Narrative   Pt live with spouse. Retired.  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    4 cups caffeine daily.      REVIEW OF SYSTEMS: Constitutional: No fevers, chills, or sweats, no generalized fatigue, change in appetite Eyes: No visual changes, double vision, eye pain Ear, nose and throat: No hearing loss, ear pain, nasal congestion, sore throat Cardiovascular: No chest pain,  palpitations Respiratory:  No shortness of breath at rest or with exertion, wheezes GastrointestinaI: No nausea, vomiting, diarrhea, abdominal pain, fecal incontinence Genitourinary:  No dysuria, urinary retention or frequency Musculoskeletal:  No neck pain, back pain Integumentary: No rash, pruritus, skin lesions Neurological: as above Psychiatric: No depression, insomnia, anxiety Endocrine: No palpitations, fatigue, diaphoresis, mood swings, change in appetite, change in weight, increased thirst Hematologic/Lymphatic:  No anemia, purpura, petechiae. Allergic/Immunologic: no itchy/runny eyes, nasal congestion, recent allergic reactions, rashes  PHYSICAL EXAM: Vitals:   02/26/18 1030  BP: (!) 150/82  Pulse: 99  SpO2: 93%   General: No acute distress Head:  Normocephalic/atraumatic Eyes: Fundoscopic exam shows bilateral sharp discs, no vessel changes, exudates, or hemorrhages Neck: supple, no paraspinal tenderness, full range of motion Back: No paraspinal tenderness Heart: regular rate and rhythm Lungs: Clear to auscultation bilaterally. Vascular: No carotid bruits. Skin/Extremities: No rash, no edema Neurological Exam: Mental status: alert and oriented to person, place, and time, no dysarthria or aphasia, Fund of knowledge is appropriate.  Recent and remote memory are intact.  Attention and concentration are normal.    Able to name objects and repeat phrases. Cranial nerves: CN I: not tested CN II: pupils equal, round and reactive to light, visual fields intact, fundi unremarkable. CN III, IV, VI:  full range of motion, no nystagmus, no ptosis CN V: facial sensation intact CN VII: upper and lower face symmetric CN VIII: hearing intact to finger rub CN IX, X: gag intact, uvula midline CN XI: sternocleidomastoid and trapezius muscles intact CN XII: tongue midline Bulk & Tone: normal, no fasciculations. Motor: 5/5 throughout with no pronator drift. Sensation: intact to light  touch, cold, pin, vibration and joint position sense.  No extinction to double simultaneous stimulation.  Romberg test negative Deep Tendon Reflexes: +2 throughout, no ankle clonus Plantar responses: downgoing bilaterally Cerebellar: no incoordination on finger to nose, heel to shin. No dysdiadochokinesia Gait: narrow-based and steady, able to tandem walk adequately. Tremor: none  IMPRESSION: This is a pleasant 72 year old right-handed woman with a history of recurrent syncope/near syncope suggestive of vagal etiology possibly from hiatal hernia/surgery. We discussed measures to take when she is symptomatic, increase  fluid intake and reduce caffeine intake. She had a small lesion in the pituitary region noted in 01/2017 imaging, she is asymptomatic from this, interval imaging of the pituitary region will be ordered for follow-up. [place] driving laws were discussed with the patient, she knows to stop driving after an episode of loss of consciousness until 6 months event-free. She will follow-up in 6 months and knows to call for any changes.   Thank you for allowing me to participate in the care of this patient. Please do not hesitate to call for any questions or concerns.   Ellouise Newer, M.D.  CC: Dr. Hilma Favors

## 2018-02-26 NOTE — Patient Instructions (Addendum)
1. Schedule MRI pituitary with and without contrast  We have sent a referral to [facility] for your MRI and they will call you directly to schedule your appt. They are located at [place]. If you need to contact them directly please call [phone].   2. Try reducing caffeine intake to 1 cup a day 3. Increase water intake to 8 glasses a day 4. Follow-up in 6 months, call for any changes

## 2018-03-06 ENCOUNTER — Encounter: Payer: Self-pay | Admitting: Neurology

## 2018-03-14 ENCOUNTER — Inpatient Hospital Stay: Admission: RE | Admit: 2018-03-14 | Payer: Medicare Other | Source: Ambulatory Visit

## 2018-04-14 ENCOUNTER — Encounter

## 2018-04-14 ENCOUNTER — Ambulatory Visit: Payer: Medicare Other | Admitting: Neurology

## 2018-04-15 DIAGNOSIS — H25813 Combined forms of age-related cataract, bilateral: Secondary | ICD-10-CM | POA: Diagnosis not present

## 2018-04-15 DIAGNOSIS — E119 Type 2 diabetes mellitus without complications: Secondary | ICD-10-CM | POA: Diagnosis not present

## 2018-04-15 DIAGNOSIS — H43811 Vitreous degeneration, right eye: Secondary | ICD-10-CM | POA: Diagnosis not present

## 2018-04-15 DIAGNOSIS — H17823 Peripheral opacity of cornea, bilateral: Secondary | ICD-10-CM | POA: Diagnosis not present

## 2018-04-15 DIAGNOSIS — H35372 Puckering of macula, left eye: Secondary | ICD-10-CM | POA: Diagnosis not present

## 2018-04-21 ENCOUNTER — Telehealth: Payer: Self-pay | Admitting: Neurology

## 2018-04-21 NOTE — Telephone Encounter (Signed)
Spoke with pt.  Advised that her MRI was approved 02/26/18-11/23/18.  MRI was scheduled for 03/14/18 and according to Epic pt No Showed MRI appointment.  Pt states that she will call [facility] and schedule MRI now.

## 2018-04-21 NOTE — Telephone Encounter (Signed)
Patient called asking about the Prior Auth needed for her MRI she was to have scheduled at [place]. She was wanting to speak with the nurse. Please call her back at [phone]. Thanks

## 2018-04-28 ENCOUNTER — Ambulatory Visit
Admission: RE | Admit: 2018-04-28 | Discharge: 2018-04-28 | Disposition: A | Discharge status: Home / Self Care | Payer: Medicare Other | Source: Ambulatory Visit | Attending: Neurology | Admitting: Neurology

## 2018-04-28 DIAGNOSIS — E237 Disorder of pituitary gland, unspecified: Secondary | ICD-10-CM | POA: Diagnosis not present

## 2018-04-28 DIAGNOSIS — R55 Syncope and collapse: Secondary | ICD-10-CM

## 2018-04-28 MED ORDER — GADOBENATE DIMEGLUMINE 529 MG/ML IV SOLN
10.0000 mL | Freq: Once | INTRAVENOUS | Status: AC | PRN
  Administered 2018-04-28: 10 mL via INTRAVENOUS

## 2018-05-14 ENCOUNTER — Telehealth: Payer: Self-pay | Admitting: Neurology

## 2018-05-14 NOTE — Telephone Encounter (Signed)
Noted  

## 2018-05-14 NOTE — Telephone Encounter (Signed)
Patient wants to know the results of the MRI please call

## 2018-05-14 NOTE — Telephone Encounter (Signed)
Disregard previous note. Patient called back stating she found the results on mychart. Thanks!

## 2018-06-22 DIAGNOSIS — Z23 Encounter for immunization: Secondary | ICD-10-CM | POA: Diagnosis not present

## 2018-08-12 DIAGNOSIS — I10 Essential (primary) hypertension: Secondary | ICD-10-CM | POA: Diagnosis not present

## 2018-08-12 DIAGNOSIS — E785 Hyperlipidemia, unspecified: Secondary | ICD-10-CM | POA: Diagnosis not present

## 2018-08-12 DIAGNOSIS — Z1389 Encounter for screening for other disorder: Secondary | ICD-10-CM | POA: Diagnosis not present

## 2018-08-12 DIAGNOSIS — Z6827 Body mass index (BMI) 27.0-27.9, adult: Secondary | ICD-10-CM | POA: Diagnosis not present

## 2018-08-12 DIAGNOSIS — E663 Overweight: Secondary | ICD-10-CM | POA: Diagnosis not present

## 2018-08-12 DIAGNOSIS — R011 Cardiac murmur, unspecified: Secondary | ICD-10-CM | POA: Diagnosis not present

## 2018-08-12 DIAGNOSIS — D509 Iron deficiency anemia, unspecified: Secondary | ICD-10-CM | POA: Diagnosis not present

## 2018-08-12 DIAGNOSIS — E119 Type 2 diabetes mellitus without complications: Secondary | ICD-10-CM | POA: Diagnosis not present

## 2018-08-12 DIAGNOSIS — Z0001 Encounter for general adult medical examination with abnormal findings: Secondary | ICD-10-CM | POA: Diagnosis not present

## 2018-10-19 ENCOUNTER — Ambulatory Visit: Payer: Medicare Other | Admitting: Neurology

## 2019-02-18 ENCOUNTER — Other Ambulatory Visit (HOSPITAL_COMMUNITY): Payer: Self-pay | Admitting: Obstetrics and Gynecology

## 2019-02-18 DIAGNOSIS — Z1231 Encounter for screening mammogram for malignant neoplasm of breast: Secondary | ICD-10-CM

## 2019-03-03 ENCOUNTER — Other Ambulatory Visit: Payer: Self-pay

## 2019-03-03 ENCOUNTER — Ambulatory Visit (HOSPITAL_COMMUNITY)
Admission: RE | Admit: 2019-03-03 | Discharge: 2019-03-03 | Disposition: A | Discharge status: Home / Self Care | Payer: Medicare Other | Source: Ambulatory Visit | Attending: Obstetrics and Gynecology | Admitting: Obstetrics and Gynecology

## 2019-03-03 ENCOUNTER — Other Ambulatory Visit (HOSPITAL_COMMUNITY): Payer: Self-pay | Admitting: Family Medicine

## 2019-03-03 DIAGNOSIS — Z1231 Encounter for screening mammogram for malignant neoplasm of breast: Secondary | ICD-10-CM | POA: Insufficient documentation

## 2019-03-15 DIAGNOSIS — Z719 Counseling, unspecified: Secondary | ICD-10-CM | POA: Diagnosis not present

## 2019-03-15 DIAGNOSIS — Z13220 Encounter for screening for lipoid disorders: Secondary | ICD-10-CM | POA: Diagnosis not present

## 2019-03-15 DIAGNOSIS — E785 Hyperlipidemia, unspecified: Secondary | ICD-10-CM | POA: Diagnosis not present

## 2019-03-15 DIAGNOSIS — Z6827 Body mass index (BMI) 27.0-27.9, adult: Secondary | ICD-10-CM | POA: Diagnosis not present

## 2019-03-15 DIAGNOSIS — Z1389 Encounter for screening for other disorder: Secondary | ICD-10-CM | POA: Diagnosis not present

## 2019-03-15 DIAGNOSIS — E663 Overweight: Secondary | ICD-10-CM | POA: Diagnosis not present

## 2019-03-15 DIAGNOSIS — I10 Essential (primary) hypertension: Secondary | ICD-10-CM | POA: Diagnosis not present

## 2019-03-15 DIAGNOSIS — E119 Type 2 diabetes mellitus without complications: Secondary | ICD-10-CM | POA: Diagnosis not present

## 2019-03-16 ENCOUNTER — Other Ambulatory Visit (HOSPITAL_COMMUNITY): Payer: Self-pay | Admitting: Family Medicine

## 2019-03-16 DIAGNOSIS — N6320 Unspecified lump in the left breast, unspecified quadrant: Secondary | ICD-10-CM

## 2019-03-17 ENCOUNTER — Other Ambulatory Visit (HOSPITAL_COMMUNITY): Payer: Self-pay | Admitting: Family Medicine

## 2019-03-17 DIAGNOSIS — N6320 Unspecified lump in the left breast, unspecified quadrant: Secondary | ICD-10-CM

## 2019-03-30 ENCOUNTER — Ambulatory Visit (HOSPITAL_COMMUNITY): Admission: RE | Admit: 2019-03-30 | Payer: Medicare Other | Source: Ambulatory Visit

## 2019-03-30 ENCOUNTER — Inpatient Hospital Stay (HOSPITAL_COMMUNITY): Admission: RE | Admit: 2019-03-30 | Payer: Medicare Other | Source: Ambulatory Visit

## 2019-03-30 ENCOUNTER — Ambulatory Visit (HOSPITAL_COMMUNITY): Payer: Medicare Other

## 2019-04-06 ENCOUNTER — Other Ambulatory Visit: Payer: Self-pay

## 2019-04-06 ENCOUNTER — Ambulatory Visit (HOSPITAL_COMMUNITY): Payer: Medicare Other

## 2019-04-06 ENCOUNTER — Ambulatory Visit (HOSPITAL_COMMUNITY)
Admission: RE | Admit: 2019-04-06 | Discharge: 2019-04-06 | Disposition: A | Discharge status: Home / Self Care | Payer: Medicare Other | Source: Ambulatory Visit | Attending: Family Medicine | Admitting: Family Medicine

## 2019-04-06 DIAGNOSIS — N6489 Other specified disorders of breast: Secondary | ICD-10-CM | POA: Insufficient documentation

## 2019-04-06 DIAGNOSIS — N6321 Unspecified lump in the left breast, upper outer quadrant: Secondary | ICD-10-CM | POA: Diagnosis not present

## 2019-04-06 DIAGNOSIS — N6320 Unspecified lump in the left breast, unspecified quadrant: Secondary | ICD-10-CM | POA: Diagnosis present

## 2019-04-06 DIAGNOSIS — R921 Mammographic calcification found on diagnostic imaging of breast: Secondary | ICD-10-CM | POA: Insufficient documentation

## 2019-04-06 DIAGNOSIS — N6324 Unspecified lump in the left breast, lower inner quadrant: Secondary | ICD-10-CM | POA: Diagnosis not present

## 2019-04-07 ENCOUNTER — Other Ambulatory Visit (HOSPITAL_COMMUNITY): Payer: Self-pay | Admitting: Family Medicine

## 2019-04-07 DIAGNOSIS — N6320 Unspecified lump in the left breast, unspecified quadrant: Secondary | ICD-10-CM

## 2019-04-13 ENCOUNTER — Ambulatory Visit (HOSPITAL_COMMUNITY)
Admission: RE | Admit: 2019-04-13 | Discharge: 2019-04-13 | Disposition: A | Discharge status: Home / Self Care | Payer: Medicare Other | Source: Ambulatory Visit | Attending: Family Medicine | Admitting: Family Medicine

## 2019-04-13 ENCOUNTER — Other Ambulatory Visit (HOSPITAL_COMMUNITY): Payer: Self-pay | Admitting: Family Medicine

## 2019-04-13 ENCOUNTER — Other Ambulatory Visit: Payer: Self-pay

## 2019-04-13 DIAGNOSIS — N6320 Unspecified lump in the left breast, unspecified quadrant: Secondary | ICD-10-CM | POA: Diagnosis present

## 2019-04-13 DIAGNOSIS — R928 Other abnormal and inconclusive findings on diagnostic imaging of breast: Secondary | ICD-10-CM

## 2019-04-13 DIAGNOSIS — C773 Secondary and unspecified malignant neoplasm of axilla and upper limb lymph nodes: Secondary | ICD-10-CM | POA: Diagnosis not present

## 2019-04-13 DIAGNOSIS — C50412 Malignant neoplasm of upper-outer quadrant of left female breast: Secondary | ICD-10-CM | POA: Diagnosis not present

## 2019-04-13 DIAGNOSIS — R590 Localized enlarged lymph nodes: Secondary | ICD-10-CM | POA: Diagnosis not present

## 2019-04-13 DIAGNOSIS — N6321 Unspecified lump in the left breast, upper outer quadrant: Secondary | ICD-10-CM | POA: Diagnosis not present

## 2019-04-13 MED ORDER — LIDOCAINE HCL (PF) 2 % IJ SOLN
INTRAMUSCULAR | Status: AC
  Administered 2019-04-13: 14:00:00
  Filled 2019-04-13: qty 20

## 2019-04-13 MED ORDER — SODIUM BICARBONATE 4.2 % IV SOLN
INTRAVENOUS | Status: AC
  Administered 2019-04-13: 14:00:00
  Filled 2019-04-13: qty 10

## 2019-04-13 MED ORDER — LIDOCAINE HCL (PF) 1 % IJ SOLN
INTRAMUSCULAR | Status: AC
  Administered 2019-04-13: 14:00:00
  Filled 2019-04-13: qty 10

## 2019-04-20 DIAGNOSIS — H43811 Vitreous degeneration, right eye: Secondary | ICD-10-CM | POA: Diagnosis not present

## 2019-04-20 DIAGNOSIS — H17823 Peripheral opacity of cornea, bilateral: Secondary | ICD-10-CM | POA: Diagnosis not present

## 2019-04-20 DIAGNOSIS — H25813 Combined forms of age-related cataract, bilateral: Secondary | ICD-10-CM | POA: Diagnosis not present

## 2019-04-20 DIAGNOSIS — H35372 Puckering of macula, left eye: Secondary | ICD-10-CM | POA: Diagnosis not present

## 2019-04-20 DIAGNOSIS — E119 Type 2 diabetes mellitus without complications: Secondary | ICD-10-CM | POA: Diagnosis not present

## 2019-04-30 DIAGNOSIS — F17200 Nicotine dependence, unspecified, uncomplicated: Secondary | ICD-10-CM | POA: Diagnosis not present

## 2019-04-30 DIAGNOSIS — C50912 Malignant neoplasm of unspecified site of left female breast: Secondary | ICD-10-CM | POA: Diagnosis not present

## 2019-05-04 NOTE — Progress Notes (Signed)
Location of Breast Cancer: Left Breast  Histology per Pathology Report:  04/13/19 Diagnosis 1. Breast, left, needle core biopsy, upper outer quadrant at 2:00 - INVASIVE MAMMARY CARCINOMA, GRADE II. 2. Lymph node, needle/core biopsy, left axilla - METASTATIC CARCINOMA.  Receptor Status: ER(100%), PR (5%), Her2-neu (NEG), Ki-(15%)  Did patient present with symptoms or was this found on screening mammography?: She had a palpable left breast lump for several months.   Past/Anticipated interventions by surgeon, if any: Patient was referred by Dr. Alphonsa Overall  Past/Anticipated interventions by medical oncology, if any:  05/13/19 Dr. Burr Medico  Lymphedema issues, if any:  N/A  Pain issues, if any:  She denies.   SAFETY ISSUES:  Prior radiation? No  Pacemaker/ICD? No  Possible current pregnancy? No  Is the patient on methotrexate? No  Current Complaints / other details:   MRI bilateral breasts 04/06/19     Dicy Smigel, Stephani Police, RN 05/04/2019,12:23 PM

## 2019-05-05 ENCOUNTER — Telehealth: Payer: Self-pay | Admitting: Hematology

## 2019-05-05 ENCOUNTER — Ambulatory Visit
Admission: RE | Admit: 2019-05-05 | Discharge: 2019-05-05 | Disposition: A | Discharge status: Home / Self Care | Payer: Medicare Other | Source: Ambulatory Visit | Attending: Radiation Oncology | Admitting: Radiation Oncology

## 2019-05-05 ENCOUNTER — Encounter: Payer: Self-pay | Admitting: Radiation Oncology

## 2019-05-05 ENCOUNTER — Other Ambulatory Visit: Payer: Self-pay

## 2019-05-05 DIAGNOSIS — Z170 Estrogen receptor positive status [ER+]: Secondary | ICD-10-CM

## 2019-05-05 DIAGNOSIS — Z800 Family history of malignant neoplasm of digestive organs: Secondary | ICD-10-CM | POA: Diagnosis not present

## 2019-05-05 DIAGNOSIS — C50412 Malignant neoplasm of upper-outer quadrant of left female breast: Secondary | ICD-10-CM

## 2019-05-05 DIAGNOSIS — F17210 Nicotine dependence, cigarettes, uncomplicated: Secondary | ICD-10-CM | POA: Diagnosis not present

## 2019-05-05 NOTE — Telephone Encounter (Signed)
Received a new patient breast referral from Dr. Lucia Gaskins for a new dx of breast cancer. Courtney Dorsey has been cld and scheduled to see Dr. Burr Medico on 9/10 at 3pm. She's been made aware to arrive 15 minutes early. Address and location to our facility has been provided to the pt.

## 2019-05-05 NOTE — Progress Notes (Signed)
Radiation Oncology         [phone] ________________________________  Initial outpatient Telephone Consultation due to pandemic precautions, patient unable to access WebEx  Name: Courtney Dorsey MRN: [id number]  Date: 05/05/2019  DOB: 26-Apr-1946  TG:GYIRSWN, Jenny Reichmann, MD  Alphonsa Overall, MD   REFERRING PHYSICIAN: Alphonsa Overall, MD  DIAGNOSIS:    ICD-10-CM   1. Carcinoma of upper-outer quadrant of left breast in female, estrogen receptor positive ([place])  C50.412    Z17.0   Cancer Staging Carcinoma of upper-outer quadrant of left breast in female, estrogen receptor positive ([place]) Staging form: Breast, AJCC 8th Edition - Clinical stage from 05/05/2019: Stage IB (cT1c, cN1, cM0, G2, ER+, PR+, HER2-) - Signed by Eppie Gibson, MD on 05/05/2019  CHIEF COMPLAINT: Here to discuss management of left breast cancer  HISTORY OF PRESENT ILLNESS:Courtney Dorsey is a 73 y.o. female who presented with a palpable left breast lump for the last few months.  She underwent diagnostic mammogram and left breast ultrasound of breast on 04/06/2019, which revealed 2 cm irregular breast mass in the 2 o'clock position of the left breast corresponding to the palpable abnormality; stable, likely benign 7 mm mass at 7 o'clock in the left breast; single abnormal left lymph node.   Biopsy on date of 04/13/2019 showed invasive mammary carcinoma, e-cadherin positive.  ER status: 100%, positive; PR status 5%, positive, Her2 status negative (by FISH); Grade 2. Biopsied lymph node positive for metastatic carcinoma.  She anticipates breast conserving surgery. Medical Oncology appointment pending with Dr. Burr Medico. She is otherwise in her USOH.   PREVIOUS RADIATION THERAPY: No  PAST MEDICAL HISTORY:  has a past medical history of Anemia, Diabetes mellitus without complication ([place]), Diverticulosis, Heart murmur, History of hiatal hernia, History of skin cancer, History of transfusion, Hypercholesteremia, Hypertension, and UTI (lower  urinary tract infection).    PAST SURGICAL HISTORY: Past Surgical History:  Procedure Laterality Date   CHOLECYSTECTOMY     COLONOSCOPY  2009   Dr. Aviva Signs   COLONOSCOPY N/A 01/01/2013   IOE:VOJJKK mucosa in the terminal ileum  Mild diverticulosis in the descending colon and sigmoid colon and sigmoid colon/Moderate sized internal hemorrhoids   ESOPHAGOGASTRODUODENOSCOPY N/A 01/01/2013   XFG:HWEXH hiatal hernia/ MODERATE SIZE PARA-ESOPHAGEAL HERNIA/MILD Erosive gastritis. chronic duodenitis c/w peptic duodenitis. no h.pylori or villous atrophy. minimal chronic gastritis.    ESOPHAGOGASTRODUODENOSCOPY N/A 02/16/2013   Procedure: ESOPHAGOGASTRODUODENOSCOPY (EGD);  Surgeon: Daneil Dolin, MD;  Location: [facility];  Service: Endoscopy;  Laterality: N/A;  8:30   GIVENS CAPSULE STUDY N/A 02/16/2013   EGD with capsule placement. no evidence of paraesophageal hernia. SB essentially unremarkable.   HIATAL HERNIA REPAIR N/A 03/21/2017   Procedure: LAPAROSCOPIC lysis of adhesions,reduction hiatal hernia, upper endoscopy,gastropexy;  Surgeon: Johnathan Hausen, MD;  Location: [facility];  Service: General;  Laterality: N/A;   KNOT REMOVED FROM SCALP     LAPAROSCOPIC NISSEN FUNDOPLICATION N/A 11/07/1694   Procedure: LAPAROSCOPIC NISSEN FUNDOPLICATION AND HIATAL HERNIA REPAIR;  Surgeon: Johnathan Hausen, MD;  Location: [facility];  Service: General;  Laterality: N/A;    FAMILY HISTORY: family history includes Colon cancer in her brother and mother; Diabetes in her brother and sister.  SOCIAL HISTORY:  reports that she has been smoking cigarettes. She has a 28.00 pack-year smoking history. She has never used smokeless tobacco. She reports that she does not drink alcohol or use drugs.  ALLERGIES: Patient has no known allergies.  MEDICATIONS:  Current Outpatient Medications  Medication Sig Dispense  Refill   acetaminophen (TYLENOL) 500 MG tablet Take 1,000 mg by mouth daily as needed for headache.      aspirin 81 MG tablet Take 81 mg by mouth every morning.      atorvastatin (LIPITOR) 80 MG tablet Take 40 mg by mouth every evening.     enalapril (VASOTEC) 10 MG tablet Take 10 mg by mouth 2 (two) times daily.     ketoconazole (NIZORAL) 2 % cream Apply 1 application topically daily as needed for irritation (behind ear.).     ondansetron (ZOFRAN-ODT) 4 MG disintegrating tablet Take 1 tablet (4 mg total) by mouth every 6 (six) hours as needed for nausea. 20 tablet 0   ranitidine (ZANTAC) 150 MG tablet Take 300 mg by mouth daily as needed for heartburn.      Simethicone (GAS-X PO) Take 1-2 tablets by mouth 2 (two) times daily as needed (stomach irritation).     TURMERIC PO Take by mouth.     No current facility-administered medications for this encounter.     REVIEW OF SYSTEMS: As above  PHYSICAL EXAM:  vitals were not taken for this visit.   General: Alert and oriented, in no acute distress   LABORATORY DATA:  Lab Results  Component Value Date   WBC 12.6 (H) 03/22/2017   HGB 13.2 03/22/2017   HCT 41.2 03/22/2017   MCV 89.6 03/22/2017   PLT 206 03/22/2017   CMP     Component Value Date/Time   NA 142 03/22/2017 0543   K 4.0 03/22/2017 0543   CL 106 03/22/2017 0543   CO2 27 03/22/2017 0543   GLUCOSE 107 (H) 03/22/2017 0543   BUN 14 03/22/2017 0543   CREATININE 0.80 03/22/2017 0543   CALCIUM 8.8 (L) 03/22/2017 0543   PROT 7.2 11/24/2016 2026   ALBUMIN 4.0 11/24/2016 2026   AST 25 11/24/2016 2026   ALT 24 11/24/2016 2026   ALKPHOS 76 11/24/2016 2026   BILITOT 0.4 11/24/2016 2026   GFRNONAA >60 03/22/2017 0543   GFRAA >60 03/22/2017 0543         RADIOGRAPHY: US Breast Ltd Uni Left Inc Axilla  Result Date: 04/06/2019 CLINICAL DATA:  Patient presents with a palpable left breast lump, noted for a couple months. EXAM: DIGITAL DIAGNOSTIC BILATERAL MAMMOGRAM WITH CAD AND TOMO ULTRASOUND LEFT BREAST COMPARISON:  Previous exam(s). ACR Breast Density Category c: The breast  tissue is heterogeneously dense, which may obscure small masses. FINDINGS: In the upper outer left breast, there is an ill-defined mass with associated architectural distortion and scattered coarse calcifications. There are stable appearing coarse calcifications scattered diffusely in both breasts. No other suspicious mass and no other evidence of architectural distortion. Mammographic images were processed with CAD. On physical exam, there is a firm mass in the upper outer left breast. Targeted ultrasound is performed, showing a hypoechoic irregular mass with ill-defined margins in the breast at 2 o'clock, 6 cm the nipple, measuring 2.0 x 1.6 x 1.9 cm. 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 Sonographic evaluation of the left axilla demonstrates 1 abnormal appearing lymph node with a cortex thickened to 6 mm. IMPRESSION: 1. 2 cm irregular breast mass with associated architectural distortion in the 2 o'clock position of the left breast corresponding to the palpable abnormality. This is highly suspicious for breast malignancy. 2. Small probably benign  mass with central calcifications in the 7 o'clock position of the breast, measuring 7 mm. This likely corresponds to a focal opacity with calcifications seen mammographically that has been stable. 3. Single abnormal left axillary lymph node with a cortical thickness of 6 mm. RECOMMENDATION: 1. Ultrasound-guided core needle biopsy of a left breast mass. 2. Ultrasound-guided core needle biopsy the abnormal left axillary lymph node. 3. The small, 7 o'clock position, left breast mass is most likely benign and likely has been stable. Short-term follow-up repeat breast ultrasound in 6 months is recommended. However, if tissue diagnosis is if desired prior to surgery, this mass could also be biopsied with ultrasound  guidance. I have discussed the findings and recommendations with the patient. Results were also provided in writing at the conclusion of the visit. If applicable, a reminder letter will be sent to the patient regarding the next appointment. BI-RADS CATEGORY  5: Highly suggestive of malignancy. Electronically Signed   By: Lajean Manes M.D.   On: 04/06/2019 14:22   Mm Diag Breast Tomo Bilateral  Result Date: 04/06/2019 CLINICAL DATA:  Patient presents with a palpable left breast lump, noted for a couple months. EXAM: DIGITAL DIAGNOSTIC BILATERAL MAMMOGRAM WITH CAD AND TOMO ULTRASOUND LEFT BREAST COMPARISON:  Previous exam(s). ACR Breast Density Category c: The breast tissue is heterogeneously dense, which may obscure small masses. FINDINGS: In the upper outer left breast, there is an ill-defined mass with associated architectural distortion and scattered coarse calcifications. There are stable appearing coarse calcifications scattered diffusely in both breasts. No other suspicious mass and no other evidence of architectural distortion. Mammographic images were processed with CAD. On physical exam, there is a firm mass in the upper outer left breast. Targeted ultrasound is performed, showing a hypoechoic irregular mass with ill-defined margins in the breast at 2 o'clock, 6 cm the nipple, measuring 2.0 x 1.6 x 1.9 cm. 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 Sonographic evaluation of the left axilla demonstrates 1 abnormal appearing lymph node with a cortex thickened to 6 mm. IMPRESSION: 1. 2 cm irregular breast mass with associated architectural distortion in the 2 o'clock position of the left breast corresponding to the palpable abnormality. This is highly suspicious for breast malignancy. 2. Small probably benign mass with central calcifications in the 7  o'clock position of the breast, measuring 7 mm. This likely corresponds to a focal opacity with calcifications seen mammographically that has been stable. 3. Single abnormal left axillary lymph node with a cortical thickness of 6 mm. RECOMMENDATION: 1. Ultrasound-guided core needle biopsy of a left breast mass. 2. Ultrasound-guided core needle biopsy the abnormal left axillary lymph node. 3. The small, 7 o'clock position, left breast mass is most likely benign and likely has been stable. Short-term follow-up repeat breast ultrasound in 6 months is recommended. However, if tissue diagnosis is if desired prior to surgery, this mass could also be biopsied with ultrasound guidance. I have discussed the findings and recommendations with the patient. Results were also provided in writing at the conclusion of the visit. If applicable, a reminder letter will be sent to the patient regarding the next appointment. BI-RADS CATEGORY  5: Highly suggestive of malignancy. Electronically Signed   By: Lajean Manes M.D.   On: 04/06/2019 14:22   [place] Axillary Node Core Biopsy Left  Addendum Date: 04/26/2019   ADDENDUM  REPORT: 04/21/2019 11:07 ADDENDUM: Patient has surgical referral appointment with Dr. Alphonsa Overall on 04/30/2019 at 8:45am. Addendum by Electa Sniff RN on 04/21/2019. Electronically Signed   By: Lajean Manes M.D.   On: 04/21/2019 11:07   Addendum Date: 04/16/2019   ADDENDUM REPORT: 04/16/2019 13:43 ADDENDUM: Pathology revealed GRADE II INVASIVE MAMMARY CARCINOMA of the Left breast, upper outer quadrant at 2:00. Immunohistochemistry for E-Cadherin shows diffuse strong positivity consistent with invasive ductal carcinoma. This was found to be concordant by Dr. Lajean Manes. 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 This was found to be concordant by Dr. Lajean Manes. Pathology results were discussed with the patient by telephone by Electa Sniff, RN, Breast Health Educator with [facility]. The patient reported doing well after the biopsies with tenderness at the sites. Post biopsy instructions and care were reviewed and questions were answered. The patient was encouraged to call [facility] for any additional concerns. A surgical referral is being arranged with Dr. Blake Divine of [facility] in [place], per patient request. Pathology results reported by Terie Purser, RN on 04/16/2019. Electronically Signed   By: Lajean Manes M.D.   On: 04/16/2019 13:43   Result Date: 04/26/2019 CLINICAL DATA:  Patient presents for ultrasound-guided core needle biopsy of an upper outer quadrant left breast mass and an abnormal left axillary lymph node. EXAM: ULTRASOUND GUIDED LEFT BREAST CORE NEEDLE BIOPSY ULTRASOUND GUIDED LEFT AXILLA CORE NEEDLE BIOPSY COMPARISON:  Previous exam(s). FINDINGS: I met with the patient and we discussed the procedure of ultrasound-guided biopsy, including benefits and alternatives. We discussed the high likelihood of a successful procedure. We discussed the risks of the procedure, including infection, bleeding, tissue injury, clip migration, and inadequate sampling. Informed written consent was given. The usual time-out protocol was performed immediately prior to the procedure. Lesion #1 quadrant: Upper outer quadrant; 2 o'clock position left breast mass. Using sterile technique and 1% Lidocaine as local anesthetic, under direct ultrasound visualization, a 12 gauge spring-loaded device was used to perform biopsy of the upper outer quadrant left breast mass using a inferolateral approach. At the conclusion of the procedure a ribbon shaped tissue marker clip was deployed into the biopsy cavity. Lesion #2 location: Left axilla.  Abnormal lymph node. Using sterile technique and 1% Lidocaine as local anesthetic, under direct ultrasound visualization, a 14 gauge spring-loaded device was used to perform biopsy of abnormal left axillary  lymph node using a medial approach. At the conclusion of the procedure a HydroMARK tissue marker clip was deployed into the biopsy cavity. Follow up 2 view mammogram was performed and dictated separately. IMPRESSION: Ultrasound guided biopsy of a left breast mass and an abnormal left axillary lymph node. No apparent complications. Electronically Signed: By: Lajean Manes M.D. On: 04/13/2019 13:56   Mm Clip Placement Left  Result Date: 04/13/2019 CLINICAL DATA:  S/p ultrasound-guided core needle biopsy of a left breast mass and abnormal left axillary lymph node. EXAM: DIAGNOSTIC LEFT MAMMOGRAM POST ULTRASOUND BIOPSY COMPARISON:  Previous exam(s). FINDINGS: Mammographic images were obtained following ultrasound guided biopsy of an upper outer quadrant breast mass and an abnormal left axillary lymph node. The ribbon shaped biopsy clip lies within the mass. HydroMARK biopsy clip could not be visualized being beyond the mammographic field of view. IMPRESSION: Well-positioned ribbon shaped biopsy clip within the upper outer quadrant left breast mass. Final Assessment: Post Procedure Mammograms for Marker Placement Electronically Signed   By: Dedra Skeens.D.  On: 04/13/2019 13:58   [place] Lt Breast Bx W Loc Dev 1st Lesion Img Bx Spec US Guide  Addendum Date: 04/26/2019   ADDENDUM REPORT: 04/21/2019 11:07 ADDENDUM: Patient has surgical referral appointment with Dr. Alphonsa Overall on 04/30/2019 at 8:45am. Addendum by Electa Sniff RN on 04/21/2019. Electronically Signed   By: Lajean Manes M.D.   On: 04/21/2019 11:07   Addendum Date: 04/16/2019   ADDENDUM REPORT: 04/16/2019 13:43 ADDENDUM: Pathology revealed GRADE II INVASIVE MAMMARY CARCINOMA of the Left breast, upper outer quadrant at 2:00. Immunohistochemistry for E-Cadherin shows diffuse strong positivity consistent with invasive ductal carcinoma. This was found to be concordant by Dr. Lajean Manes. Pathology revealed METASTATIC CARCINOMA of the Left axillary lymph  node. This was found to be concordant by Dr. Lajean Manes. Pathology results were discussed with the patient by telephone by Electa Sniff, RN, Breast Health Educator with [facility]. The patient reported doing well after the biopsies with tenderness at the sites. Post biopsy instructions and care were reviewed and questions were answered. The patient was encouraged to call [facility] for any additional concerns. A surgical referral is being arranged with Dr. Blake Divine of [facility] in [place], per patient request. Pathology results reported by Terie Purser, RN on 04/16/2019. Electronically Signed   By: Lajean Manes M.D.   On: 04/16/2019 13:43   Result Date: 04/26/2019 CLINICAL DATA:  Patient presents for ultrasound-guided core needle biopsy of an upper outer quadrant left breast mass and an abnormal left axillary lymph node. EXAM: ULTRASOUND GUIDED LEFT BREAST CORE NEEDLE BIOPSY ULTRASOUND GUIDED LEFT AXILLA CORE NEEDLE BIOPSY COMPARISON:  Previous exam(s). FINDINGS: I met with the patient and we discussed the procedure of ultrasound-guided biopsy, including benefits and alternatives. We discussed the high likelihood of a successful procedure. We discussed the risks of the procedure, including infection, bleeding, tissue injury, clip migration, and inadequate sampling. Informed written consent was given. The usual time-out protocol was performed immediately prior to the procedure. Lesion #1 quadrant: Upper outer quadrant; 2 o'clock position left breast mass. Using sterile technique and 1% Lidocaine as local anesthetic, under direct ultrasound visualization, a 12 gauge spring-loaded device was used to perform biopsy of the upper outer quadrant left breast mass using a inferolateral approach. At the conclusion of the procedure a ribbon shaped tissue marker clip was deployed into the biopsy cavity. Lesion #2 location: Left axilla.  Abnormal lymph node. Using  sterile technique and 1% Lidocaine as local anesthetic, under direct ultrasound visualization, a 14 gauge spring-loaded device was used to perform biopsy of abnormal left axillary lymph node using a medial approach. At the conclusion of the procedure a HydroMARK tissue marker clip was deployed into the biopsy cavity. Follow up 2 view mammogram was performed and dictated separately. IMPRESSION: Ultrasound guided biopsy of a left breast mass and an abnormal left axillary lymph node. No apparent complications. Electronically Signed: By: Lajean Manes M.D. 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 We discussed the risks, benefits, and side effects of radiotherapy. I recommend radiotherapy to the left breast to reduce her risk of locoregional recurrence by 2/3.  We discussed that radiation would take approximately 6 weeks to complete and that I would give the patient a few weeks to heal following surgery before starting treatment planning.  If chemotherapy were to  be given, this would precede radiotherapy. We spoke about acute effects including skin irritation and fatigue as well as much less common late effects including internal organ injury or irritation. We spoke about the latest technology that is used to minimize the risk of late effects for patients undergoing radiotherapy to the breast or chest wall. No guarantees of treatment were given. The patient is enthusiastic about proceeding with treatment. I look forward to participating in the patient's care.  I will await her referral back to me for postoperative follow-up and eventual CT simulation/treatment planning.  This encounter was provided by telemedicine platform telephone as she was unable to access Webex.  The patient has given verbal consent for this type of encounter and has been advised to only accept a meeting of this  type in a secure network environment. The time spent during this encounter was 12 minutes. The attendants for this meeting include Eppie Gibson  and Avie Echevaria.  During the encounter, Eppie Gibson was located at [facility] Radiation Oncology Department.  Avie Echevaria was located at home.     __________________________________________   Eppie Gibson, MD   This document serves as a record of services personally performed by Eppie Gibson, MD. It was created on her behalf by Wilburn Mylar, a trained medical scribe. The creation of this record is based on the scribe's personal observations and the provider's statements to them. This document has been checked and approved by the attending provider.

## 2019-05-06 ENCOUNTER — Ambulatory Visit: Payer: Medicare Other | Admitting: General Surgery

## 2019-05-07 NOTE — Progress Notes (Addendum)
[place]   [phone] Fax:([phone]) [place] Note   Patient Care Team: Sharilyn Sites, MD as PCP - General (Family Medicine) Gala Romney Cristopher Estimable, MD as Consulting Physician (Gastroenterology)  Date of Service:  05/13/2019   CHIEF COMPLAINTS/PURPOSE OF CONSULTATION:  Newly diagnosed Carcinoma of upper-outer quadrant of left breast   REFERRING PHYSICIAN:  Dr. 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 2 x 1.6 x 1.9cm irregular breast mass with associated architectural distortion in the 2 o'clock position of the left breast, 6 cm from the nipple corresponding to the palpable abnormality. This is highly suspicious for breast malignancy. 2. Small probably benign mass with central calcifications in the 7 o'clock position of the breast, measuring 7x4x7 mm. This likely corresponds to a focal opacity with calcifications seen mammographically that has been stable. 3. Single abnormal left axillary lymph node with a cortical thickness of 6 mm.   04/13/2019 Initial Biopsy   Diagnosis 04/13/19 1. Breast, left, needle core biopsy, upper outer quadrant at 2:00 - INVASIVE MAMMARY CARCINOMA, GRADE II. 2. Lymph node, needle/core biopsy, left axilla - METASTATIC CARCINOMA.   04/13/2019 Receptors her2   Results: GROUP 5: HER2 **NEGATIVE** Estrogen Receptor: 100%, POSITIVE, STRONG STAINING INTENSITY Progesterone Receptor: 5%, POSITIVE, STRONG STAINING INTENSITY Proliferation Marker Ki67: 15%   05/05/2019 Initial Diagnosis   Carcinoma of upper-outer quadrant of left breast in female, estrogen receptor positive ([place])   05/05/2019 Cancer Staging   Staging form: Breast, AJCC 8th Edition - Clinical stage from 05/05/2019: Stage IB (cT1c, cN1, cM0, G2, ER+, PR+, HER2-) - Signed by Eppie Gibson, MD on 05/05/2019      HISTORY OF PRESENTING ILLNESS:  Courtney Dorsey 73 y.o. female is a here because of newly diagnosed left breast cancer. The patient was referred by Dr. Lucia Gaskins. The patient presents to the clinic today alone.  She notes she felt her left breast mass in 10/2018. She was not able to do mammogram until 04/2019. Before this her last mammogram was 2 years ago. This is her first biopsy but had benign findings on mammogram before.   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 She lives in [place] with her husband and her 1 adult daughter who is waiting to move. She notes she does not drink but does smoke. She has tried to stop smoking but stopped using nicotine patch.  She has a PMHx of DM, no longer on medication since 50 pounds weight loss. She takes enalapril. She Gallbladder removed and hiatal hernia repair. She has h/o skin cancer. She is also on Lipitor and Irbesartan and aspirin. She notes a niece had breast cancer in her late 25s. Her mother had colon cancer and her brother had colon cancer. She notes her hot flashes was manageable and she is done with Menopause.    GYN HISTORY  Menarchal: NA LMP: in late 88s.  Contraceptive: 2 years at age 56-20.  HRT: No G1P1: 1 Adult daughter, at age 67. No breast feeding    REVIEW OF SYSTEMS:    Constitutional: Denies fevers, chills or abnormal  night sweats Eyes: Denies blurriness of vision, double vision or watery eyes Ears, nose, mouth, throat, and face: Denies mucositis or sore throat Respiratory: Denies cough, dyspnea or wheezes Cardiovascular: Denies palpitation, chest discomfort or lower extremity swelling Gastrointestinal:  Denies nausea, heartburn or change in  bowel habits Skin: Denies abnormal skin rashes Lymphatics: Denies new lymphadenopathy or easy bruising Neurological:Denies numbness, tingling or new weaknesses Behavioral/Psych: Mood is stable, no new changes  All other systems were reviewed with the patient and are negative.   MEDICAL HISTORY:  Past Medical History:  Diagnosis Date  . Anemia   . Diabetes mellitus without complication ([place])    "BORDERLINE"  . Diverticulosis   . Heart murmur    childhood   . History of hiatal hernia   . History of skin cancer   . History of transfusion   . Hypercholesteremia   . Hypertension   . UTI (lower urinary tract infection)    TAKING ANTIBIOTICS    SURGICAL HISTORY: Past Surgical History:  Procedure Laterality Date  . CHOLECYSTECTOMY    . COLONOSCOPY  2009   Dr. Aviva Signs  . COLONOSCOPY N/A 01/01/2013   AJO:INOMVE mucosa in the terminal ileum  Mild diverticulosis in the descending colon and sigmoid colon and sigmoid colon/Moderate sized internal hemorrhoids  . ESOPHAGOGASTRODUODENOSCOPY N/A 01/01/2013   [facility]:NOBSJ hiatal hernia/ MODERATE SIZE PARA-ESOPHAGEAL HERNIA/MILD Erosive gastritis. chronic duodenitis c/w peptic duodenitis. no h.pylori or villous atrophy. minimal chronic gastritis.   Marland Kitchen ESOPHAGOGASTRODUODENOSCOPY N/A 02/16/2013   Procedure: ESOPHAGOGASTRODUODENOSCOPY (EGD);  Surgeon: Daneil Dolin, MD;  [facility];  Service: Endoscopy;  Laterality: N/A;  8:30  . GIVENS CAPSULE STUDY N/A 02/16/2013   EGD with capsule placement. no evidence of paraesophageal hernia. SB essentially unremarkable.  Marland Kitchen HIATAL HERNIA REPAIR N/A 03/21/2017   Procedure: LAPAROSCOPIC lysis of adhesions,reduction hiatal hernia, upper endoscopy,gastropexy;  Surgeon: Johnathan Hausen, MD;  Location: [facility];  Service: General;  Laterality: N/A;  . KNOT REMOVED FROM SCALP    . LAPAROSCOPIC NISSEN FUNDOPLICATION N/A 02/01/8365   Procedure: LAPAROSCOPIC NISSEN FUNDOPLICATION AND HIATAL HERNIA REPAIR;   Surgeon: Johnathan Hausen, MD;  Location: [facility];  Service: General;  Laterality: N/A;    SOCIAL HISTORY: Social History   Socioeconomic History  . Marital status: Married    Spouse name: Not on file  . Number of children: 1  . Years of education: Not on file  . Highest education level: Not on file  Occupational History    Employer: RETIRED  Social Needs  . Financial resource strain: Not on file  . Food insecurity    Worry: Not on file    Inability: Not on file  . Transportation needs    Medical: No    Non-medical: No  Tobacco Use  . Smoking status: Current Every Day Smoker    Packs/day: 1.00    Years: 28.00    Pack years: 28.00    Types: Cigarettes  . Smokeless tobacco: Never Used  . Tobacco comment: trying to quit  Substance and Sexual Activity  . Alcohol use: No  . Drug use: No  . Sexual activity: Yes    Birth control/protection: Post-menopausal  Lifestyle  . Physical activity    Days per week: Not on file    Minutes per session: Not on file  . Stress: Not on file  Relationships  . Social Herbalist on phone: Not on file    Gets together: Not on file    Attends  religious service: Not on file    Active member of club or organization: Not on file    Attends meetings of clubs or organizations: Not on file    Relationship status: Not on file  . Intimate partner violence    Fear of current or ex partner: No    Emotionally abused: No    Physically abused: No    Forced sexual activity: No  Other Topics Concern  . Not on file  Social History Narrative   Pt live with spouse. Retired.  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    4 cups caffeine daily.      FAMILY HISTORY: Family History  Problem Relation Age of Onset  . Colon cancer Mother        diagnosed around age 31  . Colon cancer Brother        age 6  . Diabetes Sister   . Diabetes Brother   . Cancer Niece 6       breast cancer   . Liver disease Neg Hx   . Lung cancer Neg Hx   . Breast cancer Neg Hx    . Ovarian cancer Neg Hx   . Celiac disease Neg Hx     ALLERGIES:  has No Known Allergies.  MEDICATIONS:  Current Outpatient Medications  Medication Sig Dispense Refill  . acetaminophen (TYLENOL) 500 MG tablet Take 1,000 mg by mouth daily as needed for headache.    Marland Kitchen aspirin 81 MG tablet Take 81 mg by mouth every morning.     Marland Kitchen atorvastatin (LIPITOR) 80 MG tablet Take 40 mg by mouth every evening.    . enalapril (VASOTEC) 10 MG tablet Take 10 mg by mouth 2 (two) times daily.    Marland Kitchen ketoconazole (NIZORAL) 2 % cream Apply 1 application topically daily as needed for irritation (behind ear.).    Marland Kitchen ondansetron (ZOFRAN-ODT) 4 MG disintegrating tablet Take 1 tablet (4 mg total) by mouth every 6 (six) hours as needed for nausea. 20 tablet 0  . ranitidine (ZANTAC) 150 MG tablet Take 300 mg by mouth daily as needed for heartburn.     . Simethicone (GAS-X PO) Take 1-2 tablets by mouth 2 (two) times daily as needed (stomach irritation).    . TURMERIC PO Take by mouth.     No current facility-administered medications for this visit.     PHYSICAL EXAMINATION: ECOG PERFORMANCE STATUS: 0 - Asymptomatic  Vitals:   05/13/19 1514  BP: 108/88  Pulse: 89  Resp: 18  Temp: 98.5 F (36.9 C)  SpO2: 97%   Filed Weights   05/13/19 1514  Weight: 144 lb 9.6 oz (65.6 kg)    GENERAL:alert, no distress and comfortable SKIN: skin color, texture, turgor are normal, no rashes or significant lesions EYES: normal, Conjunctiva are pink and non-injected, sclera clear  NECK: supple, thyroid normal size, non-tender, without nodularity LYMPH:  no palpable lymphadenopathy in the cervical, axillary  LUNGS: clear to percussion with normal breathing effort (+) Mild Wheezing  HEART: regular rate & rhythm and no lower extremity edema (+) Mild heart murmur  ABDOMEN:abdomen soft, non-tender and normal bowel sounds Musculoskeletal:no cyanosis of digits and no clubbing  NEURO: alert & oriented x 3 with fluent speech, no  focal motor/sensory deficits BREAST: (+) Mild skin ecchymosis at biopsy site (+) 3x2cm mass at 2:00 position of left breast, 4cm from nipple. No palpable mass, nodules or adenopathy bilaterally. Breast exam benign.  LABORATORY DATA:  I have reviewed the data as listed  CBC Latest Ref Rng & Units 03/22/2017 03/21/2017 03/14/2017  WBC 4.0 - 10.5 K/uL 12.6(H) 19.6(H) 9.1  Hemoglobin 12.0 - 15.0 g/dL 13.2 14.4 15.5(H)  Hematocrit 36.0 - 46.0 % 41.2 43.8 45.9  Platelets 150 - 400 K/uL 206 200 235    CMP Latest Ref Rng & Units 03/22/2017 03/21/2017 03/14/2017  Glucose 65 - 99 mg/dL 107(H) - 101(H)  BUN 6 - 20 mg/dL 14 - 13  Creatinine 0.44 - 1.00 mg/dL 0.80 0.81 0.70  Sodium 135 - 145 mmol/L 142 - 140  Potassium 3.5 - 5.1 mmol/L 4.0 - 4.3  Chloride 101 - 111 mmol/L 106 - 105  CO2 22 - 32 mmol/L 27 - 26  Calcium 8.9 - 10.3 mg/dL 8.8(L) - 9.3  Total Protein 6.5 - 8.1 g/dL - - -  Total Bilirubin 0.3 - 1.2 mg/dL - - -  Alkaline Phos 38 - 126 U/L - - -  AST 15 - 41 U/L - - -  ALT 14 - 54 U/L - - -     RADIOGRAPHIC STUDIES: I have personally reviewed the radiological images as listed and agreed with the findings in the report. No results found.  ASSESSMENT & PLAN:  Courtney Dorsey is a 73 y.o. caucasian female with a history of Anemia, Diverticulosis, HTN.   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 Her Mammogram shows 2 left breast lesions and one abnormal left axillary LN. Her 2:00 position left breast and LN biopsy showed invasive ductal carcinoma with node metastasis. Low Ki 67 15% -Her 7:00 left breast lesion biopsy is set for 05/18/19.  -Given the early stage disease, she is likely a candidate for Lumpecomty and targeted lymph node dissection candidate, if second biopsy is negative for malignancy. She understands. She was seen by Dr.  Lucia Gaskins recently and likely will proceed with surgery soon.  -The risk of recurrence depends on the stage and biology of the tumor. She has early stage disease, with ER/PR positive and HER2 negative markers. I discussed this is the more common type of slow growing tumor.  -Given LN positive I recommend Mammaprint test on her surgical sentinel lymph node to determine her risk of recurrence and her benefit of chemotherapy. If it comes back as high risk, I will recommend adjuvant chemo and obtain staging CT CAP/bone scan for further evaluation.  She is 73 year old, overall has good health, will to be a candidate for chemotherapy if needed.  She understands and agrees with the plan.  -She was also seen by radiation oncologist Dr. Isidore Moos last week. Given positive LN adjuvant radiation is recommended after either type of surgery to reduce her risk of local cancer recurrence.   -Given the strong ER and PR expression in her postmenopausal status, I recommend adjuvant endocrine therapy with aromatase inhibitor for a total of 5-10 years to reduce the risk of cancer recurrence. Potential benefits and side effects were discussed with patient and she is interested. -We also discussed the breast cancer surveillance after her surgery. She will continue annual screening mammogram, self exam, and a routine office visit with lab and exam with [place]. -I encouraged her to have healthy diet and exercise regularly -Physical exam today shows her 2:00 mass to be 3x2cm of left breast. Will request her July labs from her PCP, Dr. Armandina Gemma. -She will proceed with surgery soon. Will f/u after  surgery or RT.  -She lives in [place]. I discussed we have a Medical oncologist, Dr. Providence Crosby at [facility] hospital to see her if she prefers. She will think about it.   2. Genetic Testing  -She notes a niece had breast cancer in her late 51s. Her mother and brother both had colon cancer.  -She is interested in genetic testing. I will refer her if  eligible.    3. Smoking Cessation  -She has a long h/o smoking. She has been trying to quit but stopped using nicotine patch.  -I encouraged her to start weaning herself off the amount she smokes until she completely quits. She is agreeable.  -She has mild Wheezing on exam today, consistent with her COPD (05/13/19)   PLAN:  -will send genetic referral if she qualifies  -Proceed with second left breast biopsy on 05/18/19 -Proceed with surgery soon  -Mammaprint if positive nodes 1-3 -F/u after surgery or RT   No orders of the defined types were placed in this encounter.   All questions were answered. The patient knows to call the clinic with any problems, questions or concerns. I spent 40 minutes counseling the patient face to face. The total time spent in the appointment was 50 minutes and more than 50% was on counseling.     Truitt Merle, MD 05/13/2019 7:27 PM  I, Joslyn Devon, am acting as scribe for Truitt Merle, MD.   I have reviewed the above documentation for accuracy and completeness, and I agree with the above.   Addendum  I received her recent lab CBC and CMP from 03/15/2019 from her PCP, which was unremarkable, no need to repeat lab for now.   Truitt Merle

## 2019-05-12 ENCOUNTER — Other Ambulatory Visit (HOSPITAL_COMMUNITY): Payer: Self-pay | Admitting: Surgery

## 2019-05-12 DIAGNOSIS — N6320 Unspecified lump in the left breast, unspecified quadrant: Secondary | ICD-10-CM

## 2019-05-13 ENCOUNTER — Other Ambulatory Visit: Payer: Self-pay

## 2019-05-13 ENCOUNTER — Inpatient Hospital Stay: Payer: Medicare Other | Attending: Hematology | Admitting: Hematology

## 2019-05-13 ENCOUNTER — Encounter: Payer: Self-pay | Admitting: Hematology

## 2019-05-13 VITALS — BP 108/88 | HR 89 | Temp 98.5°F | Resp 18 | Ht 61.5 in | Wt 144.6 lb

## 2019-05-13 DIAGNOSIS — Z79899 Other long term (current) drug therapy: Secondary | ICD-10-CM | POA: Insufficient documentation

## 2019-05-13 DIAGNOSIS — F17210 Nicotine dependence, cigarettes, uncomplicated: Secondary | ICD-10-CM | POA: Diagnosis not present

## 2019-05-13 DIAGNOSIS — R062 Wheezing: Secondary | ICD-10-CM | POA: Diagnosis not present

## 2019-05-13 DIAGNOSIS — Z8719 Personal history of other diseases of the digestive system: Secondary | ICD-10-CM | POA: Diagnosis not present

## 2019-05-13 DIAGNOSIS — Z800 Family history of malignant neoplasm of digestive organs: Secondary | ICD-10-CM

## 2019-05-13 DIAGNOSIS — C50412 Malignant neoplasm of upper-outer quadrant of left female breast: Secondary | ICD-10-CM | POA: Insufficient documentation

## 2019-05-13 DIAGNOSIS — E119 Type 2 diabetes mellitus without complications: Secondary | ICD-10-CM | POA: Insufficient documentation

## 2019-05-13 DIAGNOSIS — Z803 Family history of malignant neoplasm of breast: Secondary | ICD-10-CM

## 2019-05-13 DIAGNOSIS — R232 Flushing: Secondary | ICD-10-CM | POA: Diagnosis not present

## 2019-05-13 DIAGNOSIS — Z833 Family history of diabetes mellitus: Secondary | ICD-10-CM

## 2019-05-13 DIAGNOSIS — Z85828 Personal history of other malignant neoplasm of skin: Secondary | ICD-10-CM | POA: Diagnosis not present

## 2019-05-13 DIAGNOSIS — R634 Abnormal weight loss: Secondary | ICD-10-CM | POA: Diagnosis not present

## 2019-05-13 DIAGNOSIS — Z170 Estrogen receptor positive status [ER+]: Secondary | ICD-10-CM | POA: Diagnosis not present

## 2019-05-14 ENCOUNTER — Telehealth: Payer: Self-pay | Admitting: Hematology

## 2019-05-14 NOTE — Telephone Encounter (Signed)
Patient does not wish to schedule genetics at this time. Message below sent to Dr. Burr Medico. 9/11 schedule message for genetics not executed.   Hello Dr. Burr Medico, Appointment has not been scheduled per the request of Courtney Dorsey. Per Mrs. Champlain she would like to think about this a little more. She says she only has one child, who is unable to have children so she doesn't know if she even wants to fool with this.   She will call if she wishes to schedule.  Thanks, [employer]

## 2019-05-14 NOTE — Addendum Note (Signed)
Addended by: Truitt Merle on: 05/14/2019 07:49 AM   Modules accepted: Orders

## 2019-05-14 NOTE — Telephone Encounter (Signed)
Per 9/10 los F/u open

## 2019-05-18 ENCOUNTER — Ambulatory Visit (HOSPITAL_COMMUNITY)
Admission: RE | Admit: 2019-05-18 | Discharge: 2019-05-18 | Disposition: A | Discharge status: Home / Self Care | Payer: Medicare Other | Source: Ambulatory Visit | Attending: Family Medicine | Admitting: Family Medicine

## 2019-05-18 ENCOUNTER — Telehealth: Payer: Self-pay

## 2019-05-18 ENCOUNTER — Other Ambulatory Visit: Payer: Self-pay

## 2019-05-18 ENCOUNTER — Other Ambulatory Visit (HOSPITAL_COMMUNITY): Payer: Self-pay | Admitting: Family Medicine

## 2019-05-18 ENCOUNTER — Ambulatory Visit (HOSPITAL_COMMUNITY)
Admission: RE | Admit: 2019-05-18 | Discharge: 2019-05-18 | Disposition: A | Discharge status: Home / Self Care | Payer: Medicare Other | Source: Ambulatory Visit | Attending: Surgery | Admitting: Surgery

## 2019-05-18 DIAGNOSIS — N6320 Unspecified lump in the left breast, unspecified quadrant: Secondary | ICD-10-CM | POA: Insufficient documentation

## 2019-05-18 DIAGNOSIS — N6324 Unspecified lump in the left breast, lower inner quadrant: Secondary | ICD-10-CM | POA: Diagnosis not present

## 2019-05-18 DIAGNOSIS — R928 Other abnormal and inconclusive findings on diagnostic imaging of breast: Secondary | ICD-10-CM | POA: Insufficient documentation

## 2019-05-18 DIAGNOSIS — C50912 Malignant neoplasm of unspecified site of left female breast: Secondary | ICD-10-CM | POA: Diagnosis not present

## 2019-05-18 DIAGNOSIS — R239 Unspecified skin changes: Secondary | ICD-10-CM | POA: Diagnosis not present

## 2019-05-18 MED ORDER — LIDOCAINE-EPINEPHRINE (PF) 1 %-1:200000 IJ SOLN
INTRAMUSCULAR | Status: AC
  Administered 2019-05-18: 09:00:00
  Filled 2019-05-18: qty 30

## 2019-05-18 MED ORDER — SODIUM BICARBONATE 4.2 % IV SOLN
INTRAVENOUS | Status: AC
  Administered 2019-05-18: 09:00:00
  Filled 2019-05-18: qty 10

## 2019-05-18 MED ORDER — LIDOCAINE HCL (PF) 2 % IJ SOLN
INTRAMUSCULAR | Status: AC
  Administered 2019-05-18: 09:00:00
  Filled 2019-05-18: qty 10

## 2019-05-18 NOTE — Telephone Encounter (Signed)
Patient calls today to let Dr. Burr Medico know that she is not interested in Genetic Counseling.

## 2019-05-21 ENCOUNTER — Other Ambulatory Visit (HOSPITAL_COMMUNITY): Payer: Self-pay | Admitting: Surgery

## 2019-05-21 DIAGNOSIS — C50912 Malignant neoplasm of unspecified site of left female breast: Secondary | ICD-10-CM

## 2019-05-26 ENCOUNTER — Other Ambulatory Visit (HOSPITAL_COMMUNITY): Payer: Self-pay | Admitting: Surgery

## 2019-05-26 ENCOUNTER — Encounter: Payer: Self-pay | Admitting: *Deleted

## 2019-05-26 DIAGNOSIS — C50912 Malignant neoplasm of unspecified site of left female breast: Secondary | ICD-10-CM

## 2019-05-28 ENCOUNTER — Other Ambulatory Visit (HOSPITAL_COMMUNITY): Payer: Self-pay | Admitting: Surgery

## 2019-05-28 DIAGNOSIS — C50912 Malignant neoplasm of unspecified site of left female breast: Secondary | ICD-10-CM

## 2019-06-10 ENCOUNTER — Encounter (HOSPITAL_BASED_OUTPATIENT_CLINIC_OR_DEPARTMENT_OTHER): Payer: Self-pay

## 2019-06-10 ENCOUNTER — Other Ambulatory Visit: Payer: Self-pay

## 2019-06-14 ENCOUNTER — Other Ambulatory Visit: Payer: Self-pay

## 2019-06-14 ENCOUNTER — Other Ambulatory Visit (HOSPITAL_COMMUNITY): Payer: Medicare Other

## 2019-06-14 ENCOUNTER — Encounter (HOSPITAL_COMMUNITY)
Admission: RE | Admit: 2019-06-14 | Discharge: 2019-06-14 | Disposition: A | Discharge status: Home / Self Care | Payer: Medicare Other | Source: Ambulatory Visit | Attending: Surgery | Admitting: Surgery

## 2019-06-14 ENCOUNTER — Other Ambulatory Visit (HOSPITAL_COMMUNITY)
Admission: RE | Admit: 2019-06-14 | Discharge: 2019-06-14 | Disposition: A | Discharge status: Home / Self Care | Payer: Medicare Other | Source: Ambulatory Visit | Attending: Surgery | Admitting: Surgery

## 2019-06-14 DIAGNOSIS — R9431 Abnormal electrocardiogram [ECG] [EKG]: Secondary | ICD-10-CM | POA: Diagnosis not present

## 2019-06-14 DIAGNOSIS — Z01818 Encounter for other preprocedural examination: Secondary | ICD-10-CM | POA: Insufficient documentation

## 2019-06-14 DIAGNOSIS — Z20828 Contact with and (suspected) exposure to other viral communicable diseases: Secondary | ICD-10-CM | POA: Insufficient documentation

## 2019-06-14 DIAGNOSIS — C50912 Malignant neoplasm of unspecified site of left female breast: Secondary | ICD-10-CM | POA: Diagnosis not present

## 2019-06-14 DIAGNOSIS — I10 Essential (primary) hypertension: Secondary | ICD-10-CM | POA: Insufficient documentation

## 2019-06-14 LAB — SARS CORONAVIRUS 2 (TAT 6-24 HRS): SARS Coronavirus 2: NEGATIVE

## 2019-06-15 NOTE — H&P (Signed)
Courtney Dorsey  Location: [facility] Surgery Patient #: [id number] DOB: 08-Oct-1945 Married / Language: English / Race: White Female  History of Present Illness   The patient is a 73 year old female who presents with a complaint of breast cancer.  The PCP is D.r Courtney Dorsey  The patient was referred by She comes with her husband  Courtney Dorsey Covid-19 virus has disrupted normal medical care in [place] and across the nation. We have sometimes had to alter normal surgical/medical care to limit this epidemic and we have explained these changes to the patient.]  She had not had a mammogram in a while. She noticed a mass in her left breast about February, 2020. Her mammograms were delayed because of the coronavirus, though she did not inform the breast center that she had a palpable mass. She has no prior history of breast disease or biopsies.  [facility]: [facility] 89[facility] - 1. 2 cm irregular breast mass with associated architectural distortion in the 2 o'clock position of the left breast corresponding to the palpable abnormality. This is highly suspicious for breast malignancy. 2. Small probably benign mass with central calcifications in the 7 o'clock position of the breast, measuring 7 mm. This likely corresponds to a focal opacity with calcifications seen mammographically that has been stable. 3. Single abnormal left axillary lymph node with a cortical thickness of 6 mm. Biopsy: Left breast biopsy at 2:00 ([id number]) on 04/13/2019 that shows IDC, grade 2, ER - 100%, PR - 5%, Ki67 - 15%, Her2Neu - negative, Left axillary lymph node - Metastatic carcinoma Family history of breast or ovarian cancer: Her brother's daughter has breast cancer On hormone therapy: No  I discussed the options for breast cancer treatment with the patient. The patient will need medical oncology and radiation oncology consultation. I discussed  the surgical options of lumpectomy vs. mastectomy. If mastectomy, there is the possibility of reconstruction. I discussed the options of lymph node biopsy. The treatment plan depends on the pathologic staging of the tumor and the patient's personal wishes. The risks of surgery include, but are not limited to, bleeding, infection, the need for further surgery, and nerve injury. The patient has been given literature on the treatment of breast cancer.  Plan: 1) Medical and radiation oncology consultation, 2) Biopsy 2nd area in left breast at 7 o'clock, 3) Surgery dependent on findings (at this time, she is a candidate for lumpectomy with targeted left axillary node dissection)  Past Medical History: 1. Nissen fundoplication - 10/31/4968 - Courtney Dorsey of recurrent HH - 03/21/2017 Courtney Dorsey I talked to her about seeing Courtney Dorsey - for now they want to stay with me. 2. Smokes She is trying to quit 3. HTN 4. Lost about 50 pounds about 3 years ago - this resolved DM and some other med issues 5. Last colonoscopy about 2015 at [place] History: Married Has a daughter who is 81 yo. She is living with them now - en route to another house?    Allergies Courtney Dorsey, CMA; 04/30/2019 8:35 AM) No Known Drug Allergies  [08/31/2015]: Allergies Reconciled   Medication History Courtney Dorsey, CMA; 04/30/2019 8:35 AM) RaNITidine HCl (300MG Capsule, Oral) Active. Ketoconazole (Topical) (2% Shampoo, External) Active. Enalapril Maleate (10MG Tablet, Oral) Active. Aspirin (81MG Tablet DR, Oral) Active. Atorvastatin Calcium (40MG Tablet, Oral) Active. MetFORMIN HCl (500MG Tablet, Oral) Active. Medications Reconciled  Vitals (Courtney Dorsey CMA; 04/30/2019 8:36 AM) 04/30/2019 8:35 AM Weight: 144 lb Height: 61in Body  Surface Area: 1.64 m Body Mass Index: 27.21 kg/m  Temp.: 97.63F (Temporal)  Pulse: 95 (Regular)  P.OX: 95% (Room air) BP:  126/68(Sitting, Left Arm, Standard)  Physical Exam  General: Older WF who is alert and generally healthy appearing. She is wearing a mask. Skin: Inspection and palpation of the skin unremarkable.  Eyes: Conjunctivae white, pupils equal. Face, ears, nose, mouth, and throat: Wearing a mask.  Neck: Supple. No mass. Trachea midline. No thyroid mass. Lymph Nodes: No supraclavicular or cervical adenopathy.  No axillary adenopathy.  Lungs: Normal respiratory effort. Clear to auscultation and symmetric breath sounds. Cardiovascular: Regular rate and rhythm. Normal auscultation of the heart. No murmur or rub.  Breasts: Right - no mass or nodule  Left - Her left breast is slightly larger than her right breast. She has a 2.0 cm mass in the UOQ of the left breast at about 2 o'clock. She has a bruise from the biopsy in the UOQ.  Abdomen: Soft. No mass. Liver and spleen not palpable. No tenderness. No hernia. Normal bowel sounds. Has prior laparoscopic scars Rectal: Not Dorsey.  Musculoskeletal/extremities: Normal gait. Good strength and ROM in upper and lower extremities.  Neurologic: Grossly intact to motor and sensory function. Psychiatric: Has normal mood and affect. Judgement and insight appear normal.   Assessment & Plan  1.  BREAST CANCER, STAGE 2, LEFT (C50.912)  Story: Left breast biopsy at 2:00 ([id number]) on 04/13/2019 that shows IDC, grade 2, ER - 100%, PR - 5%, Ki67 - 15%, Her2Neu - negative, Left axillary node - Metastatic carcinoma  Plan:  1) Medical and radiation oncology consultation   She saw Courtney Dorsey and Courtney Dorsey  2) Biopsy 2nd area in left breast at 7 o'clock   Addendum Note(Courtney Lanigan H. Lucia Gaskins MD; 05/19/2019 4:44 PM)   called by Courtney Dorsey - 2nd biopsy - IDC, grade 2 - this is in a different quadrant    Addendum Note(Courtney Pellicane H. Lucia Gaskins MD; 05/20/2019 5:23 PM)   I spoke to the patient that her left breast biopsy on 9/15 also showed cancer. This biopsy was at 7 o'clock in  the left breast, a different quadrant.   ----------   I spoke to the patient about the findings.   She could have a double lumpectomy or a mastectomy (with reconstruction, if she wants it).   She'll talk to her husband. She'll call our office tomorrow with a decision.    Addendum Note(Courtney Dorsey Dame H. Lucia Gaskins MD; 05/21/2019 5:00 PM)   She wants to go ahead with double lumpectomy.  3) Surgery - double left breast lumpectomy with targeted left axillary node dissection in that she has a positive left axillary node.   2. SMOKES (F17.200)  3. Nissen fundoplication - 12/04/7355 - Courtney Dorsey of recurrent HH - 03/21/2017 Courtney Dorsey I talked to her about seeing Courtney Dorsey - for now they want to stay with me. 4. HTN 5. Lost about 50 pounds about 3 years ago - this resolved DM and some other med issues   Alphonsa Overall, MD, [facility] Surgery Pager: [phone] Office phone:  [phone]

## 2019-06-17 ENCOUNTER — Ambulatory Visit (HOSPITAL_COMMUNITY)
Admission: RE | Admit: 2019-06-17 | Discharge: 2019-06-17 | Disposition: A | Discharge status: Home / Self Care | Payer: Medicare Other | Source: Home / Self Care | Attending: Surgery | Admitting: Surgery

## 2019-06-17 ENCOUNTER — Ambulatory Visit
Admission: RE | Admit: 2019-06-17 | Discharge: 2019-06-17 | Disposition: A | Discharge status: Home / Self Care | Payer: Medicare Other | Source: Ambulatory Visit | Attending: Surgery | Admitting: Surgery

## 2019-06-17 ENCOUNTER — Encounter (HOSPITAL_BASED_OUTPATIENT_CLINIC_OR_DEPARTMENT_OTHER): Payer: Self-pay | Admitting: *Deleted

## 2019-06-17 ENCOUNTER — Ambulatory Visit (HOSPITAL_BASED_OUTPATIENT_CLINIC_OR_DEPARTMENT_OTHER): Payer: Medicare Other | Admitting: Anesthesiology

## 2019-06-17 ENCOUNTER — Other Ambulatory Visit: Payer: Self-pay

## 2019-06-17 ENCOUNTER — Encounter (HOSPITAL_BASED_OUTPATIENT_CLINIC_OR_DEPARTMENT_OTHER)
Admission: RE | Disposition: A | Discharge status: Home / Self Care | Payer: Self-pay | Source: Home / Self Care | Attending: Surgery

## 2019-06-17 ENCOUNTER — Ambulatory Visit (HOSPITAL_BASED_OUTPATIENT_CLINIC_OR_DEPARTMENT_OTHER)
Admission: RE | Admit: 2019-06-17 | Discharge: 2019-06-17 | Disposition: A | Discharge status: Home / Self Care | Payer: Medicare Other | Attending: Surgery | Admitting: Surgery

## 2019-06-17 DIAGNOSIS — C50912 Malignant neoplasm of unspecified site of left female breast: Secondary | ICD-10-CM | POA: Diagnosis not present

## 2019-06-17 DIAGNOSIS — Z170 Estrogen receptor positive status [ER+]: Secondary | ICD-10-CM

## 2019-06-17 DIAGNOSIS — C50312 Malignant neoplasm of lower-inner quadrant of left female breast: Secondary | ICD-10-CM | POA: Insufficient documentation

## 2019-06-17 DIAGNOSIS — I10 Essential (primary) hypertension: Secondary | ICD-10-CM | POA: Diagnosis not present

## 2019-06-17 DIAGNOSIS — Z7982 Long term (current) use of aspirin: Secondary | ICD-10-CM | POA: Diagnosis not present

## 2019-06-17 DIAGNOSIS — Z79899 Other long term (current) drug therapy: Secondary | ICD-10-CM | POA: Insufficient documentation

## 2019-06-17 DIAGNOSIS — F17210 Nicotine dependence, cigarettes, uncomplicated: Secondary | ICD-10-CM | POA: Insufficient documentation

## 2019-06-17 DIAGNOSIS — C773 Secondary and unspecified malignant neoplasm of axilla and upper limb lymph nodes: Secondary | ICD-10-CM | POA: Diagnosis not present

## 2019-06-17 DIAGNOSIS — G8918 Other acute postprocedural pain: Secondary | ICD-10-CM | POA: Diagnosis not present

## 2019-06-17 DIAGNOSIS — E785 Hyperlipidemia, unspecified: Secondary | ICD-10-CM | POA: Insufficient documentation

## 2019-06-17 DIAGNOSIS — C50412 Malignant neoplasm of upper-outer quadrant of left female breast: Secondary | ICD-10-CM | POA: Insufficient documentation

## 2019-06-17 DIAGNOSIS — Z803 Family history of malignant neoplasm of breast: Secondary | ICD-10-CM | POA: Diagnosis not present

## 2019-06-17 HISTORY — PX: BREAST LUMPECTOMY WITH RADIOACTIVE SEED AND AXILLARY LYMPH NODE DISSECTION: SHX6656

## 2019-06-17 SURGERY — BREAST LUMPECTOMY WITH RADIOACTIVE SEED AND AXILLARY LYMPH NODE DISSECTION
Anesthesia: General | Site: Breast | Laterality: Left

## 2019-06-17 MED ORDER — PROMETHAZINE HCL 25 MG/ML IJ SOLN: 6.2500 mg | INTRAMUSCULAR | Status: DC | PRN

## 2019-06-17 MED ORDER — TECHNETIUM TC 99M SULFUR COLLOID FILTERED
1.0000 | Freq: Once | INTRAVENOUS | Status: AC | PRN
  Administered 2019-06-17: 1 via INTRADERMAL

## 2019-06-17 MED ORDER — PHENYLEPHRINE HCL (PRESSORS) 10 MG/ML IV SOLN
INTRAVENOUS | Status: DC | PRN
  Administered 2019-06-17: 40 ug via INTRAVENOUS

## 2019-06-17 MED ORDER — METHYLENE BLUE 0.5 % INJ SOLN
INTRAVENOUS | Status: DC | PRN
  Administered 2019-06-17: 1 mL via INTRADERMAL

## 2019-06-17 MED ORDER — FENTANYL CITRATE (PF) 100 MCG/2ML IJ SOLN
INTRAMUSCULAR | Status: AC
  Filled 2019-06-17: qty 2

## 2019-06-17 MED ORDER — CELECOXIB 200 MG PO CAPS
ORAL_CAPSULE | ORAL | Status: AC
  Filled 2019-06-17: qty 2

## 2019-06-17 MED ORDER — EPHEDRINE SULFATE 50 MG/ML IJ SOLN
INTRAMUSCULAR | Status: DC | PRN
  Administered 2019-06-17 (×3): 10 mg via INTRAVENOUS

## 2019-06-17 MED ORDER — ACETAMINOPHEN 500 MG PO TABS
ORAL_TABLET | ORAL | Status: AC
  Filled 2019-06-17: qty 2

## 2019-06-17 MED ORDER — FENTANYL CITRATE (PF) 100 MCG/2ML IJ SOLN: 25.0000 ug | INTRAMUSCULAR | Status: DC | PRN

## 2019-06-17 MED ORDER — ALBUTEROL SULFATE HFA 108 (90 BASE) MCG/ACT IN AERS
INHALATION_SPRAY | RESPIRATORY_TRACT | Status: DC | PRN
  Administered 2019-06-17 (×2): 4 via RESPIRATORY_TRACT

## 2019-06-17 MED ORDER — BUPIVACAINE LIPOSOME 1.3 % IJ SUSP
INTRAMUSCULAR | Status: DC | PRN
  Administered 2019-06-17: 10 mL

## 2019-06-17 MED ORDER — MIDAZOLAM HCL 2 MG/2ML IJ SOLN
1.0000 mg | INTRAMUSCULAR | Status: DC | PRN
  Administered 2019-06-17: 1 mg via INTRAVENOUS

## 2019-06-17 MED ORDER — DEXAMETHASONE SODIUM PHOSPHATE 4 MG/ML IJ SOLN
INTRAMUSCULAR | Status: DC | PRN
  Administered 2019-06-17: 10 mg via INTRAVENOUS

## 2019-06-17 MED ORDER — CEFAZOLIN SODIUM-DEXTROSE 2-4 GM/100ML-% IV SOLN
2.0000 g | INTRAVENOUS | Status: AC
  Administered 2019-06-17: 2 g via INTRAVENOUS

## 2019-06-17 MED ORDER — LABETALOL HCL 5 MG/ML IV SOLN
INTRAVENOUS | Status: DC | PRN
  Administered 2019-06-17: 5 mg via INTRAVENOUS

## 2019-06-17 MED ORDER — PROPOFOL 10 MG/ML IV BOLUS
INTRAVENOUS | Status: DC | PRN
  Administered 2019-06-17: 150 mg via INTRAVENOUS
  Administered 2019-06-17 (×2): 50 mg via INTRAVENOUS

## 2019-06-17 MED ORDER — BUPIVACAINE-EPINEPHRINE (PF) 0.25% -1:200000 IJ SOLN
INTRAMUSCULAR | Status: DC | PRN
  Administered 2019-06-17: 30 mL

## 2019-06-17 MED ORDER — ACETAMINOPHEN 500 MG PO TABS
1000.0000 mg | ORAL_TABLET | Freq: Once | ORAL | Status: AC
  Administered 2019-06-17: 1000 mg via ORAL

## 2019-06-17 MED ORDER — CHLORHEXIDINE GLUCONATE CLOTH 2 % EX PADS: 6.0000 | MEDICATED_PAD | Freq: Once | CUTANEOUS | Status: DC

## 2019-06-17 MED ORDER — SODIUM CHLORIDE (PF) 0.9 % IJ SOLN
INTRAMUSCULAR | Status: DC | PRN
Start: 2019-06-17 — End: 2019-06-17
  Administered 2019-06-17: .25 mL

## 2019-06-17 MED ORDER — CELECOXIB 400 MG PO CAPS
400.0000 mg | ORAL_CAPSULE | Freq: Once | ORAL | Status: AC
  Administered 2019-06-17: 11:00:00 400 mg via ORAL

## 2019-06-17 MED ORDER — MIDAZOLAM HCL 2 MG/2ML IJ SOLN
INTRAMUSCULAR | Status: AC
  Filled 2019-06-17: qty 2

## 2019-06-17 MED ORDER — BUPIVACAINE HCL (PF) 0.25 % IJ SOLN
INTRAMUSCULAR | Status: DC | PRN
  Administered 2019-06-17: 15 mL

## 2019-06-17 MED ORDER — LACTATED RINGERS IV SOLN
INTRAVENOUS | Status: DC
  Administered 2019-06-17 (×2): via INTRAVENOUS

## 2019-06-17 MED ORDER — CEFAZOLIN SODIUM-DEXTROSE 2-4 GM/100ML-% IV SOLN
INTRAVENOUS | Status: AC
  Filled 2019-06-17: qty 100

## 2019-06-17 MED ORDER — HYDROCODONE-ACETAMINOPHEN 5-325 MG PO TABS: 1.0000 | ORAL_TABLET | Freq: Four times a day (QID) | ORAL | 0 refills | Status: DC | PRN

## 2019-06-17 MED ORDER — FENTANYL CITRATE (PF) 100 MCG/2ML IJ SOLN
50.0000 ug | INTRAMUSCULAR | Status: DC | PRN
  Administered 2019-06-17 (×2): 100 ug via INTRAVENOUS

## 2019-06-17 MED ORDER — SUCCINYLCHOLINE CHLORIDE 20 MG/ML IJ SOLN
INTRAMUSCULAR | Status: DC | PRN
  Administered 2019-06-17: 100 mg via INTRAVENOUS

## 2019-06-17 MED ORDER — ONDANSETRON HCL 4 MG/2ML IJ SOLN
INTRAMUSCULAR | Status: DC | PRN
  Administered 2019-06-17: 4 mg via INTRAVENOUS

## 2019-06-17 SURGICAL SUPPLY — 60 items
ADH SKN CLS APL DERMABOND .7 (GAUZE/BANDAGES/DRESSINGS) ×1
APL PRP STRL LF DISP 70% ISPRP (MISCELLANEOUS) ×1
APPLIER CLIP 11 MED OPEN (CLIP) ×3
APR CLP MED 11 20 MLT OPN (CLIP) ×1
BINDER BREAST LRG (GAUZE/BANDAGES/DRESSINGS) ×3 IMPLANT
BLADE SURG 10 STRL SS (BLADE) ×3 IMPLANT
BLADE SURG 15 STRL LF DISP TIS (BLADE) ×2 IMPLANT
BLADE SURG 15 STRL SS (BLADE) ×3
CANISTER SUCT 1200ML W/VALVE (MISCELLANEOUS) ×4 IMPLANT
CHLORAPREP W/TINT 26 (MISCELLANEOUS) ×4 IMPLANT
CLIP APPLIE 11 MED OPEN (CLIP) ×1 IMPLANT
CLIP VESOCCLUDE SM WIDE 6/CT (CLIP) ×6 IMPLANT
CLOSURE WOUND 1/2 X4 (GAUZE/BANDAGES/DRESSINGS)
COVER BACK TABLE REUSABLE LG (DRAPES) ×4 IMPLANT
COVER MAYO STAND REUSABLE (DRAPES) ×4 IMPLANT
COVER PROBE W GEL 5X96 (DRAPES) ×4 IMPLANT
COVER WAND RF STERILE (DRAPES) ×3 IMPLANT
DECANTER SPIKE VIAL GLASS SM (MISCELLANEOUS) ×3 IMPLANT
DERMABOND ADVANCED (GAUZE/BANDAGES/DRESSINGS) ×2
DERMABOND ADVANCED .7 DNX12 (GAUZE/BANDAGES/DRESSINGS) ×1 IMPLANT
DRAPE HALF SHEET 70X43 (DRAPES) ×4 IMPLANT
DRAPE LAPAROSCOPIC ABDOMINAL (DRAPES) ×4 IMPLANT
DRAPE UTILITY XL STRL (DRAPES) ×4 IMPLANT
DRSG PAD ABDOMINAL 8X10 ST (GAUZE/BANDAGES/DRESSINGS) ×5 IMPLANT
ELECT COATED BLADE 2.86 ST (ELECTRODE) ×4 IMPLANT
ELECT REM PT RETURN 9FT ADLT (ELECTROSURGICAL) ×3
ELECTRODE REM PT RTRN 9FT ADLT (ELECTROSURGICAL) ×2 IMPLANT
GAUZE SPONGE 4X4 12PLY STRL (GAUZE/BANDAGES/DRESSINGS) ×6 IMPLANT
GAUZE SPONGE 4X4 12PLY STRL LF (GAUZE/BANDAGES/DRESSINGS) IMPLANT
GLOVE BIO SURGEON STRL SZ 6.5 (GLOVE) ×1 IMPLANT
GLOVE BIO SURGEONS STRL SZ 6.5 (GLOVE) ×1
GLOVE BIOGEL PI IND STRL 6.5 (GLOVE) IMPLANT
GLOVE BIOGEL PI INDICATOR 6.5 (GLOVE) ×6
GLOVE SURG SS PI 6.5 STRL IVOR (GLOVE) ×2 IMPLANT
GLOVE SURG SYN 7.5  E (GLOVE) ×6
GLOVE SURG SYN 7.5 E (GLOVE) ×3 IMPLANT
GLOVE SURG SYN 7.5 PF PI (GLOVE) ×2 IMPLANT
GOWN STRL REUS W/ TWL LRG LVL3 (GOWN DISPOSABLE) ×2 IMPLANT
GOWN STRL REUS W/ TWL XL LVL3 (GOWN DISPOSABLE) ×2 IMPLANT
GOWN STRL REUS W/TWL LRG LVL3 (GOWN DISPOSABLE) ×6
GOWN STRL REUS W/TWL XL LVL3 (GOWN DISPOSABLE) ×3
KIT MARKER MARGIN INK (KITS) ×4 IMPLANT
NDL HYPO 25X1 1.5 SAFETY (NEEDLE) ×2 IMPLANT
NDL SAFETY ECLIPSE 18X1.5 (NEEDLE) ×1 IMPLANT
NEEDLE HYPO 18GX1.5 SHARP (NEEDLE) ×3
NEEDLE HYPO 25X1 1.5 SAFETY (NEEDLE) ×6 IMPLANT
NS IRRIG 1000ML POUR BTL (IV SOLUTION) ×4 IMPLANT
PACK BASIN DAY SURGERY FS (CUSTOM PROCEDURE TRAY) ×4 IMPLANT
PENCIL BUTTON HOLSTER BLD 10FT (ELECTRODE) ×4 IMPLANT
SLEEVE SCD COMPRESS KNEE MED (MISCELLANEOUS) ×4 IMPLANT
SPONGE LAP 18X18 RF (DISPOSABLE) ×3 IMPLANT
STRIP CLOSURE SKIN 1/2X4 (GAUZE/BANDAGES/DRESSINGS) IMPLANT
SUT MNCRL AB 4-0 PS2 18 (SUTURE) ×3 IMPLANT
SUT VICRYL 3-0 CR8 SH (SUTURE) ×4 IMPLANT
SYR CONTROL 10ML LL (SYRINGE) ×6 IMPLANT
TOWEL GREEN STERILE FF (TOWEL DISPOSABLE) ×4 IMPLANT
TRAY FAXITRON CT DISP (TRAY / TRAY PROCEDURE) ×4 IMPLANT
TUBE CONNECTING 20'X1/4 (TUBING) ×1
TUBE CONNECTING 20X1/4 (TUBING) ×3 IMPLANT
YANKAUER SUCT BULB TIP NO VENT (SUCTIONS) ×4 IMPLANT

## 2019-06-17 NOTE — Op Note (Signed)
06/17/2019  3:48 PM  PATIENT:  Courtney Dorsey DOB: 06/22/1946 MRN: [id number]  PREOP DIAGNOSIS:   LEFT BREAST CANCER X 2  POSTOP DIAGNOSIS:    Left breast cancer, 2 o'clock and 7 o'clock position (T2, N1)  PROCEDURE:   Procedure(s): LEFT BREAST LUMPECTOMY X2  WITH RADIOACTIVE SEED X 2 AND LEFT SEED TARGETED AXILLARY LYMPH NODE EXCISION,, Injection of peri areolar area of breast with methylene blue (1.0 cc), deep sentinel lymph node biopsy  SURGEON:   Alphonsa Overall, M.D.  ANESTHESIA:   General  Anesthesiologist: Duane Boston, MD CRNA: Verita Lamb, CRNA; Tawni Millers, CRNA  General  EBL:  75  ml  DRAINS:  none   LOCAL MEDICATIONS USED:   30 cc 1/4% marcaine  SPECIMEN:   Left breast lumpectomy - 7 o'clock (6 color paint), inferior margin of 7 o'clock cancer,  left breast lumpectomy - 2 o'clock (6 color paint), left axillary targeted node dissection (seed, counts 240, background 20, blue node)  COUNTS CORRECT:  YES  INDICATIONS FOR PROCEDURE:  Courtney Dorsey is a 73 y.o. (DOB: 1946/02/08) white female whose primary care physician is Sharilyn Sites, MD and comes for left breast lumpectomy x 2 and left axillary targeted lymph node dissection.   She saw Drs. Ernest Mallick for oncology.  The options for breast cancer treatment have been discussed with the patient. She elected to proceed with lumpectomy x 2 and left axillary targeted lymph node dissection.     The indications and potential complications of surgery were explained to the patient. Potential complications include, but are not limited to, bleeding, infection, the need for further surgery, and nerve injury.     She had two I131 seeds placed on 06/17/2019 in her left breast at [facility].  The seeds are in the 2 o'clock position and 7 o'clock position of the left breast.   In the holding area, her left areola was injected with 1 millicurie of Technitium Sulfur Colloid.  OPERATIVE NOTE:   The patient was taken  to operating room # 8 at [facility] where she underwent a general anesthesia  supervised by Anesthesiologist: Duane Boston, MD CRNA: Verita Lamb, CRNA; Tawni Millers, CRNA. Her left breast and axilla were prepped with  ChloraPrep and sterilely draped.    A time-out was held and the surgical check list was reviewed.    I injected about 1.0 mL of 40% methylene blue around her left areola.   The 2 cancers were at the 2 o'clock position and 7 o'clock position of the left breast.   I made incisions directly over the cancers.    I used the Neoprobe to identify the I131 seed.  I tried to excise an area around the tumor of at least 1 cm.       At the 7 o'clock position, I  excised this block of breast tissue approximately 3 cm by 3 cm  in diameter.  I took the dissection down to the chest wall.    I painted the lumpectomy specimen with the 6 color paint kit and did a specimen mammogram which confirmed the mass, clip, and the seed were all in the right position in the specimen.  The specimen was sent to pathology who called back to confirm that they have the seed and the specimen.  Because I thought the tumor was close to the inferior margin, I excised an additional inferior margin and painted this additional tissue.   At the  2 o'clock position, approximately 6 cm off the areola, I  excised this block of breast tissue approximately 4 cm by 5 cm  in diameter.  I took the dissection down to the chest wall.    I painted the lumpectomy specimen with the 6 color paint kit and did a specimen mammogram which confirmed the mass, clip, and the seed were all in the right position in the specimen.  The specimen was sent to pathology who called back to confirm that they have the seed and the specimen.     I then started the left deep targeted axillary lymph node dissection. I made an incision in the left axilla.  I found the seed in the mid axilla and excised the node mass.  In this excised node mass, I found  a hot area looking for the sentinel lymph node.. The  hot node that had counts of 240 and the background has 20 counts. The lymph node was blue. So the sentinel and targeted nodes were in the same lymph node group.  I checked her internal mammary nodes and supraclavicular nodes with the neoprobe and found no other hot area. The axillary node was then sent to pathology.    I then irrigated the wound with saline. I infiltrated approximately 30 mL of 1/4% Marcaine between the incisions. I placed 4 clips to mark each lumpectomy cavity, at 12, 3, 6, and 9 o'clock.  I then closed all the wounds in layers using 3-0 Vicryl sutures for the deep layer. At the skin, I closed the incisions with a 4-0 Monocryl suture. The incisions were then painted with Dermabond.  She had gauze place over the wounds and placed in a breast binder.   The patient tolerated the procedure well, was transported to the recovery room in good condition. Sponge and needle count were correct at the end of the case.   Final pathology is pending.   Left breast lumpectomy at 2 o'clock   Left breast lumpectomy at 7 o'clock   Alphonsa Overall, MD, [facility] Pager: [phone] Office phone:  [phone]

## 2019-06-17 NOTE — Anesthesia Procedure Notes (Signed)
Anesthesia Regional Block: Pectoralis block   Pre-Anesthetic Checklist: ,, timeout performed, Correct Patient, Correct Site, Correct Laterality, Correct Procedure, Correct Position, site marked, Risks and benefits discussed,  Surgical consent,  Pre-op evaluation,  At surgeon's request and post-op pain management  Laterality: Left  Prep: chloraprep       Needles:  Injection technique: Single-shot  Needle Type: Echogenic Stimulator Needle     Needle Length: 10cm  Needle Gauge: 21     Additional Needles:   Narrative:  Start time: 06/17/2019 10:54 AM End time: 06/17/2019 11:04 AM Injection made incrementally with aspirations every 5 mL.  Performed by: Personally

## 2019-06-17 NOTE — Discharge Instructions (Signed)
[facility] - DISCHARGE INSTRUCTIONS TO PATIENT  Activity:  Driving - May drive in 2 or 3 days   Lifting - No lifting more than 15 pounds for 5 days, then no limit                       Practice your Covid-19 protection:  Wear a mask, social distance, and wash your hands frequently  Wound Care:   Leave the incision dry for 2 days, then you may shower  Diet:  As tolerated  Follow up appointment:  Call Dr. Pollie Friar office [facility]) at [phone] for an appointment in 2 to 3 weeks.  Medications and dosages:  Resume your home medications.  You have a prescription for:  Vicodin  Call Dr. Lucia Gaskins or his office  [phone]) if you have:  Temperature greater than 100.4,  Persistent nausea and vomiting,  Severe uncontrolled pain,  Redness, tenderness, or signs of infection (pain, swelling, redness, odor or green/yellow discharge around the site),  Difficulty breathing, headache or visual disturbances,  Any other questions or concerns you may have after discharge.  In an emergency, call 911 or go to an Emergency Department at a nearby hospital.   No tylenol until 5pm No Ibuprofen until 7pm  Post Anesthesia Home Care Instructions  Activity: Get plenty of rest for the remainder of the day. A responsible individual must stay with you for 24 hours following the procedure.  For the next 24 hours, DO NOT: -Drive a car -Paediatric nurse -Drink alcoholic beverages -Take any medication unless instructed by your physician -Make any legal decisions or sign important papers.  Meals: Start with liquid foods such as gelatin or soup. Progress to regular foods as tolerated. Avoid greasy, spicy, heavy foods. If nausea and/or vomiting occur, drink only clear liquids until the nausea and/or vomiting subsides. Call your physician if vomiting continues.  Special Instructions/Symptoms: Your throat may feel dry or sore from the anesthesia or the breathing tube  placed in your throat during surgery. If this causes discomfort, gargle with warm salt water. The discomfort should disappear within 24 hours.  If you had a scopolamine patch placed behind your ear for the management of post- operative nausea and/or vomiting:  1. The medication in the patch is effective for 72 hours, after which it should be removed.  Wrap patch in a tissue and discard in the trash. Wash hands thoroughly with soap and water. 2. You may remove the patch earlier than 72 hours if you experience unpleasant side effects which may include dry mouth, dizziness or visual disturbances. 3. Avoid touching the patch. Wash your hands with soap and water after contact with the patch.    Information for Discharge Teaching: EXPAREL (bupivacaine liposome injectable suspension)   Your surgeon or anesthesiologist gave you EXPAREL(bupivacaine) to help control your pain after surgery.   EXPAREL is a local anesthetic that provides pain relief by numbing the tissue around the surgical site.  EXPAREL is designed to release pain medication over time and can control pain for up to 72 hours.  Depending on how you respond to EXPAREL, you may require less pain medication during your recovery.  Possible side effects:  Temporary loss of sensation or ability to move in the area where bupivacaine was injected.  Nausea, vomiting, constipation  Rarely, numbness and tingling in your mouth or lips, lightheadedness, or anxiety may occur.  Call your doctor right away if you think you may be experiencing any  of these sensations, or if you have other questions regarding possible side effects.  Follow all other discharge instructions given to you by your surgeon or nurse. Eat a healthy diet and drink plenty of water or other fluids.  If you return to the hospital for any reason within 96 hours following the administration of EXPAREL, it is important for health care providers to know that you have received this  anesthetic. A teal colored band has been placed on your arm with the date, time and amount of EXPAREL you have received in order to alert and inform your health care providers. Please leave this armband in place for the full 96 hours following administration, and then you may remove the band.

## 2019-06-17 NOTE — Anesthesia Procedure Notes (Signed)
Procedure Name: Intubation Performed by: Verita Lamb, CRNA Pre-anesthesia Checklist: Patient identified, Emergency Drugs available, Suction available and Patient being monitored Patient Re-evaluated:Patient Re-evaluated prior to induction Oxygen Delivery Method: Circle system utilized Preoxygenation: Pre-oxygenation with 100% oxygen Induction Type: IV induction Laryngoscope Size: Mac and 4 Grade View: Grade I Tube type: Oral Tube size: 7.0 mm Number of attempts: 1 Airway Equipment and Method: Stylet and Oral airway Placement Confirmation: ETT inserted through vocal cords under direct vision,  positive ETCO2,  breath sounds checked- equal and bilateral and CO2 detector Secured at: 24 cm Tube secured with: Tape Dental Injury: Teeth and Oropharynx as per pre-operative assessment  Comments: Pt reported problems with reflux so we decided to do an rsi induction with cricoid pressure.  Induction after preo2 with 100 percent oxygen.  No ventilation, grade 1 view with mac 4 blade (mac 3 would have been better).  ett 7.0 inserted atraumatically, positive etco2

## 2019-06-17 NOTE — Transfer of Care (Signed)
Immediate Anesthesia Transfer of Care Note  Patient: Courtney Dorsey  Procedure(s) Performed: LEFT BREAST LUMPECTOMY X2  WITH RADIOACTIVE SEED X2 AND LEFT SEED TARGETED AXILLARY LYMPH NODE EXCISION AND SENTINEL LYMPH NODE EXCISION (Left Breast)  Patient Location: PACU  Anesthesia Type:General  Level of Consciousness: awake, alert  and oriented  Airway & Oxygen Therapy: Patient Spontanous Breathing and Patient connected to face mask oxygen  Post-op Assessment: Report given to RN and Post -op Vital signs reviewed and stable  Post vital signs: Reviewed and stable  Last Vitals:  Vitals Value Taken Time  BP 146/76 06/17/19 1351  Temp    Pulse 95 06/17/19 1352  Resp 21 06/17/19 1352  SpO2 99 % 06/17/19 1352    Last Pain:  Vitals:   06/17/19 1047  PainSc: 0-No pain         Complications: No apparent anesthesia complications

## 2019-06-17 NOTE — Anesthesia Preprocedure Evaluation (Addendum)
Anesthesia Evaluation  Patient identified by MRN, date of birth, ID band Patient awake    Reviewed: Allergy & Precautions, NPO status , Patient's Chart, lab work & pertinent test results  History of Anesthesia Complications Negative for: history of anesthetic complications  Airway Mallampati: II  TM Distance: >3 FB Neck ROM: Full    Dental no notable dental hx. (+) Dental Advisory Given   Pulmonary Current Smoker and Patient abstained from smoking.,    Pulmonary exam normal        Cardiovascular hypertension, Pt. on medications Normal cardiovascular exam  Pre-op eval per cardiology ([place])  ECG: SR, rate 98  ECHO:  Left ventricle: The cavity size was normal. Wall thickness was normal. Systolic function was normal. The estimated ejection fraction was in the range of 60% to 65%. Wall motion was normal; there were no regional wall motion abnormalities. Doppler parameters are consistent with abnormal left ventricular relaxation (grade 1 diastolic dysfunction). Aortic valve: Mildly calcified annulus. Trileaflet; mildly thickened leaflets. Valve area (VTI): 1.65 cm^2. Valve area (Vmax): 1.75 cm^2. Mitral valve: Mildly to moderately calcified annulus. Mildly thickened leaflets .   Neuro/Psych  Neuromuscular disease    GI/Hepatic hiatal hernia,   Endo/Other  diabetes, Type 2, Oral Hypoglycemic Agents  Renal/GU      Musculoskeletal   Abdominal   Peds  Hematology   Anesthesia Other Findings Hyperlipidemia  Reproductive/Obstetrics                            Anesthesia Physical  Anesthesia Plan  ASA: III  Anesthesia Plan: General   Post-op Pain Management:  Regional for Post-op pain   Induction: Intravenous  PONV Risk Score and Plan: 2 and Ondansetron, Dexamethasone, Propofol and Midazolam  Airway Management Planned: LMA  Additional Equipment:   Intra-op Plan:   Post-operative  Plan: Extubation in OR  Informed Consent: I have reviewed the patients History and Physical, chart, labs and discussed the procedure including the risks, benefits and alternatives for the proposed anesthesia with the patient or authorized representative who has indicated his/her understanding and acceptance.     Dental advisory given  Plan Discussed with: Anesthesiologist, Surgeon and CRNA  Anesthesia Plan Comments:        Anesthesia Quick Evaluation

## 2019-06-17 NOTE — Anesthesia Postprocedure Evaluation (Signed)
Anesthesia Post Note  Patient: Courtney Dorsey  Procedure(s) Performed: LEFT BREAST LUMPECTOMY X2  WITH RADIOACTIVE SEED X2 AND LEFT SEED TARGETED AXILLARY LYMPH NODE EXCISION AND SENTINEL LYMPH NODE EXCISION (Left Breast)     Patient location during evaluation: PACU Anesthesia Type: General Level of consciousness: sedated Pain management: pain level controlled Vital Signs Assessment: post-procedure vital signs reviewed and stable Respiratory status: spontaneous breathing and respiratory function stable Cardiovascular status: stable Postop Assessment: no apparent nausea or vomiting Anesthetic complications: no    Last Vitals:  Vitals:   06/17/19 1415 06/17/19 1502  BP: (!) 144/75 139/68  Pulse: 95 93  Resp: 16 17  Temp:  37.2 C  SpO2: 95% 94%    Last Pain:  Vitals:   06/17/19 1502  PainSc: 0-No pain                 Wayne Wicklund DANIEL

## 2019-06-17 NOTE — Interval H&P Note (Signed)
History and Physical Interval Note:  06/17/2019 11:02 AM  Courtney Dorsey  has presented today for surgery, with the diagnosis of LEFT BREAST CANCER X2.  The various methods of treatment have been discussed with the patient and family. Two seeds in her left breast and one in the axilla.    After consideration of risks, benefits and other options for treatment, the patient has consented to  Procedure(s): LEFT BREAST LUMPECTOMY X2  WITH RADIOACTIVE SEED X2 AND LEFT SEED TARGETED AXILLARY LYMPH NODE DISSECTION (Left) as a surgical intervention.  The patient's history has been reviewed, patient examined, no change in status, stable for surgery.  I have reviewed the patient's chart and labs.  Questions were answered to the patient's satisfaction.     Shann Medal

## 2019-06-17 NOTE — Progress Notes (Signed)
Assisted Dr. Singer with left, ultrasound guided, pectoralis block. Side rails up, monitors on throughout procedure. See vital signs in flow sheet. Tolerated Procedure well. °

## 2019-06-18 ENCOUNTER — Encounter (HOSPITAL_BASED_OUTPATIENT_CLINIC_OR_DEPARTMENT_OTHER): Payer: Self-pay | Admitting: Surgery

## 2019-06-21 LAB — SURGICAL PATHOLOGY

## 2019-06-22 ENCOUNTER — Telehealth: Payer: Self-pay | Admitting: *Deleted

## 2019-06-22 NOTE — Telephone Encounter (Signed)
Received order for mammaprint testing. Requisition faxed to pathology and [facility]

## 2019-06-24 LAB — SURGICAL PATHOLOGY

## 2019-06-28 ENCOUNTER — Telehealth: Payer: Self-pay

## 2019-06-28 NOTE — Telephone Encounter (Signed)
Patient called for f/u with Dr. Burr Medico after her breast surgery.  Scheduled her for Tuesday 11/3 at 1:20.  She verbalized an understanding.

## 2019-07-05 ENCOUNTER — Telehealth: Payer: Self-pay | Admitting: *Deleted

## 2019-07-05 ENCOUNTER — Encounter: Payer: Self-pay | Admitting: *Deleted

## 2019-07-05 NOTE — Telephone Encounter (Signed)
Received mammaprint results low risk.  Patient is aware.  Message sent for Dr. Isidore Moos appointment.  Patient prefers to see Dr. Burr Medico when xrt complete.  I have cancelled her appointment.

## 2019-07-06 ENCOUNTER — Inpatient Hospital Stay: Payer: Medicare Other | Admitting: Hematology

## 2019-07-06 DIAGNOSIS — Z23 Encounter for immunization: Secondary | ICD-10-CM | POA: Diagnosis not present

## 2019-07-06 NOTE — Progress Notes (Signed)
Location of Breast Cancer: Left Breast  Histology per Pathology Report:  04/13/19 Diagnosis 1. Breast, left, needle core biopsy, upper outer quadrant at 2:00 - INVASIVE MAMMARY CARCINOMA, GRADE II. 2. Lymph node, needle/core biopsy, left axilla - METASTATIC CARCINOMA.  Receptor Status: ER(100%), PR (5%), Her2-neu (NEG), Ki-(15%)  06/17/19 FINAL MICROSCOPIC DIAGNOSIS: A. 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 - Margins of resection are not involved (Closest margin: Less than 1 mm, Superior/medial). - See oncology table. B. BREAST, LEFT, 2 O'CLOCK, LUMPECTOMY: - Invasive ductal carcinoma, 2.8 cm, Nottingham grade 2 of 3. - Margins of resection are not involved (Closest margin: Less than 1 mm, Posterior). - Ductal carcinoma in situ. - See oncology table. C. BREAST, LEFT, ADDITIONAL INFERIOR MARGIN, 7 O'CLOCK, EXCISION: - Breast tissue, negative for carcinoma. D. SENTINEL LYMPH NODE, LEFT AXILLARY, BIOPSY: - Metastatic carcinoma present in one of one lymph node (1/1). - Biopsy site changes. E. SENTINEL LYMPH NODE, LEFT AXILLARY, BIOPSY: - Metastatic carcinoma present in one of one lymph node (1/1). F. SENTINEL LYMPH NODE, LEFT AXILLARY, BIOPSY: - One lymph node, negative for carcinoma (0/1).  Did patient present with symptoms or was this found on screening mammography?: She had a palpable left breast lump for several months.   Past/Anticipated interventions by surgeon, if any: 06/17/19 PROCEDURE:   Procedure(s): LEFT BREAST LUMPECTOMY X2  WITH RADIOACTIVE SEED X 2 AND LEFT SEED TARGETED AXILLARY LYMPH NODE EXCISION,, Injection of peri areolar area of breast with methylene blue (1.0 cc), deep sentinel lymph node biopsy SURGEON:   Alphonsa Overall, M.D.   Past/Anticipated interventions by medical oncology, if any:  05/13/19 Dr. Burr Medico. She will see Dr. Burr Medico at the completion of radiation.   Lymphedema issues, if any: She  reports her left breast is slightly larger than her right. She did have a seroma drained at her follow up appointment with Dr. Lucia Gaskins.    Pain issues, if any:  She denies.   SAFETY ISSUES:  Prior radiation? No  Pacemaker/ICD? No  Possible current pregnancy? No  Is the patient on methotrexate? No  Current Complaints / other details:

## 2019-07-08 ENCOUNTER — Encounter: Payer: Self-pay | Admitting: *Deleted

## 2019-07-12 NOTE — Progress Notes (Signed)
Radiation Oncology         (336) [phone] ________________________________  Name: Courtney Dorsey MRN: [id number]  Date: 07/13/2019  DOB: 28-May-1946  Follow-Up Visit Note by telephone as patient was unable to access MyChart video during pandemic precautions  Outpatient  CC: Sharilyn Sites, MD  Alphonsa Overall, MD  Diagnosis:      ICD-10-CM   1. Carcinoma of upper-outer quadrant of left breast in female, estrogen receptor positive ([place])  C50.412    Z17.0      Cancer Staging Carcinoma of upper-outer quadrant of left breast in female, estrogen receptor positive ([place]) Staging form: Breast, AJCC 8th Edition - Clinical stage from 05/05/2019: Stage IB (cT1c, cN1, cM0, G2, ER+, PR+, HER2-) - Signed by Eppie Gibson, MD on 05/05/2019   CHIEF COMPLAINT: Here to discuss management of left breast cancer  Narrative:  The patient returns today for follow-up to discuss radiation treatment options. She was seen in consultation on 05/05/2019.     Since consultation date, she underwent further left breast biopsy from 7 o'clock on 05/18/2019, which showed invasive ductal carcinoma, grade 2, with intermediate grade DCIS.  She opted to proceed with left lumpectomies on date of 06/17/2019 with pathology report revealing: tumor size of 0.7 cm and 2.8 cm; histology of invasive ductal carcinoma; margin status to invasive disease of <1 mm and margin status to in situ disease of 4 mm; nodal status of positive (2/3) with ENE present; Grade 2.  No more surgery recommended by Dr Lucia Gaskins. Mammaprint - low risk.  Symptomatically, the patient reports:  Lymphedema issues, if any: She reports her left breast is slightly larger than her right. She did have a seroma drained at her follow up appointment with Dr. Lucia Gaskins.    Pain issues, if any:  She denies.   SAFETY ISSUES:  Prior radiation? No  Pacemaker/ICD? No  Possible current pregnancy? No  Is the patient on methotrexate? No          ALLERGIES:  has No Known  Allergies.  Meds: Current Outpatient Medications  Medication Sig Dispense Refill   acetaminophen (TYLENOL) 500 MG tablet Take 1,000 mg by mouth daily as needed for headache.     aspirin 81 MG tablet Take 81 mg by mouth every morning.      atorvastatin (LIPITOR) 80 MG tablet Take 40 mg by mouth every evening.     cimetidine (TAGAMET) 200 MG tablet Take 200 mg by mouth 2 (two) times daily.     enalapril (VASOTEC) 10 MG tablet Take 10 mg by mouth 2 (two) times daily.     ondansetron (ZOFRAN-ODT) 4 MG disintegrating tablet Take 1 tablet (4 mg total) by mouth every 6 (six) hours as needed for nausea. 20 tablet 0   Simethicone (GAS-X PO) Take 1-2 tablets by mouth 2 (two) times daily as needed (stomach irritation).     HYDROcodone-acetaminophen (NORCO/VICODIN) 5-325 MG tablet Take 1 tablet by mouth every 6 (six) hours as needed for moderate pain. (Patient not taking: Reported on 07/13/2019) 15 tablet 0   No current facility-administered medications for this encounter.     Physical Findings:  vitals were not taken for this visit. .     General: Alert and oriented, in no acute distress   Lab Findings: Lab Results  Component Value Date   WBC 12.6 (H) 03/22/2017   HGB 13.2 03/22/2017   HCT 41.2 03/22/2017   MCV 89.6 03/22/2017   PLT 206 03/22/2017    Radiographic Findings: Nm Sentinel  Node Inj-no Rpt (breast)  Result Date: 06/17/2019 Sulfur colloid was injected by the nuclear medicine technologist for melanoma sentinel node.   Mm Breast Surgical Specimen  Result Date: 06/17/2019 CLINICAL DATA:  Status post breast conservation surgery and targeted axillary lymph node dissection today after earlier radioactive seed localizations. EXAM: SPECIMEN RADIOGRAPH OF THE LEFT BREAST Specimen radiograph of the LEFT axilla COMPARISON:  Previous exam(s). FINDINGS: Status post excision of the left breast. The radioactive seed and biopsy marker clip are present, completely intact, and were  marked for pathology. Status post excision of the LEFT axillary lymph node. The radioactive seed and biopsy marker clip are present, completely intact and were marked for pathology. Findings discussed with the OR staff during the procedure. IMPRESSION: Specimen radiograph of the left breast. Specimen radiograph of the LEFT axillary lymph node. Electronically Signed   By: Franki Cabot M.D.   On: 06/17/2019 13:06   Mm Breast Surgical Specimen  Result Date: 06/17/2019 CLINICAL DATA:  Status post breast conservation surgery and targeted axillary lymph node dissection today after earlier radioactive seed localizations. EXAM: SPECIMEN RADIOGRAPH OF THE LEFT BREAST Specimen radiograph of the LEFT axilla COMPARISON:  Previous exam(s). FINDINGS: Status post excision of the left breast. The radioactive seed and biopsy marker clip are present, completely intact, and were marked for pathology. Status post excision of the LEFT axillary lymph node. The radioactive seed and biopsy marker clip are present, completely intact and were marked for pathology. Findings discussed with the OR staff during the procedure. IMPRESSION: Specimen radiograph of the left breast. Specimen radiograph of the LEFT axillary lymph node. Electronically Signed   By: Franki Cabot M.D.   On: 06/17/2019 13:06   Mm Breast Surgical Specimen  Result Date: 06/17/2019 CLINICAL DATA:  Evaluate specimen EXAM: SPECIMEN RADIOGRAPH OF THE LEFT BREAST COMPARISON:  Previous exam(s). FINDINGS: Status post excision of the left breast. The radioactive seed and biopsy marker clip are present, completely intact, and were marked for pathology. IMPRESSION: Specimen radiograph of the left breast. Electronically Signed   By: Dorise Bullion III M.D   On: 06/17/2019 12:15   Mm Lt Radioactive Seed Loc Mammo Guide  Result Date: 06/17/2019 CLINICAL DATA:  Patient with 2 sites of invasive carcinoma within the LEFT breast and a biopsy-proven metastasis within a LEFT  axillary lymph node. Patient is scheduled for breast conservation surgery and targeted lymph node dissection today requiring preoperative radioactive seed localizations. EXAM: MAMMOGRAPHIC GUIDED RADIOACTIVE SEED LOCALIZATION OF THE LEFT BREAST x2 COMPARISON:  Previous exam(s). FINDINGS: Patient presents for radioactive seed localization prior to breast conservation surgery. I met with the patient and we discussed the procedure of seed localization including benefits and alternatives. We discussed the high likelihood of a successful procedure. We discussed the risks of the procedure including infection, bleeding, tissue injury and further surgery. We discussed the low dose of radioactivity involved in the procedure. Informed, written consent was given. The usual time-out protocol was performed immediately prior to the procedure. Site 1: Using mammographic guidance, sterile technique, 1% lidocaine and an I-125 radioactive seed, the ribbon shaped clip within the outer LEFT breast was localized using a lateral approach. The follow-up mammogram images confirm the seed in the expected location and were marked for Dr. Lucia Gaskins. Follow-up survey of the patient confirms presence of the radioactive seed. Order number of I-125 seed:  [id number]. Total activity:  3.532 millicuries reference Date: 06/09/2019 Site 2: Using mammographic guidance, sterile technique, 1% lidocaine and an I-125 radioactive seed, the  wing shaped clip within the inner LEFT breast was localized using a medial approach. The follow-up mammogram images confirm the seed in the expected location and were marked for Dr. Lucia Gaskins. Follow-up survey of the patient confirms presence of the radioactive seed. Order number of I-125 seed:  [id number]. Total activity:  1.287 millicuries reference Date: 05/04/2019 The patient tolerated the procedure well and was released from the [facility]. She was given instructions regarding seed removal. IMPRESSION: 1. Radioactive  seed localization LEFT breast, upper outer quadrant. 2. Radioactive seed localization LEFT breast, lower inner quadrant No apparent complications. Electronically Signed   By: Franki Cabot M.D.   On: 06/17/2019 09:48   Mm Lt Rad Seed Ea Add Lesion Loc Mammo  Result Date: 06/17/2019 CLINICAL DATA:  Patient with 2 sites of invasive carcinoma within the LEFT breast and a biopsy-proven metastasis within a LEFT axillary lymph node. Patient is scheduled for breast conservation surgery and targeted lymph node dissection today requiring preoperative radioactive seed localizations. EXAM: MAMMOGRAPHIC GUIDED RADIOACTIVE SEED LOCALIZATION OF THE LEFT BREAST x2 COMPARISON:  Previous exam(s). FINDINGS: Patient presents for radioactive seed localization prior to breast conservation surgery. I met with the patient and we discussed the procedure of seed localization including benefits and alternatives. We discussed the high likelihood of a successful procedure. We discussed the risks of the procedure including infection, bleeding, tissue injury and further surgery. We discussed the low dose of radioactivity involved in the procedure. Informed, written consent was given. The usual time-out protocol was performed immediately prior to the procedure. Site 1: Using mammographic guidance, sterile technique, 1% lidocaine and an I-125 radioactive seed, the ribbon shaped clip within the outer LEFT breast was localized using a lateral approach. The follow-up mammogram images confirm the seed in the expected location and were marked for Dr. Lucia Gaskins. Follow-up survey of the patient confirms presence of the radioactive seed. Order number of I-125 seed:  [id number]. Total activity:  7.096 millicuries reference Date: 06/09/2019 Site 2: Using mammographic guidance, sterile technique, 1% lidocaine and an I-125 radioactive seed, the wing shaped clip within the inner LEFT breast was localized using a medial approach. The follow-up mammogram images  confirm the seed in the expected location and were marked for Dr. Lucia Gaskins. Follow-up survey of the patient confirms presence of the radioactive seed. Order number of I-125 seed:  [id number]. Total activity:  6.546 millicuries reference Date: 05/04/2019 The patient tolerated the procedure well and was released from the [facility]. She was given instructions regarding seed removal. IMPRESSION: 1. Radioactive seed localization LEFT breast, upper outer quadrant. 2. Radioactive seed localization LEFT breast, lower inner quadrant No apparent complications. Electronically Signed   By: Franki Cabot M.D.   On: 06/17/2019 09:48   [place] Lt Radioactive Seed Ea Add Lesion  Result Date: 06/17/2019 CLINICAL DATA:  Patient 2 sites of invasive carcinoma within the LEFT breast and a biopsy-proven metastasis within a LEFT axillary lymph node. Patient is scheduled today for breast conservation surgery and targeted lymph node dissection requiring preoperative radioactive seed localizations. EXAM: ULTRASOUND GUIDED RADIOACTIVE SEED LOCALIZATION OF THE LEFT AXILLA COMPARISON:  Previous exam(s). FINDINGS: Patient presents for radioactive seed localization prior to targeted lymph node dissection. I met with the patient and we discussed the procedure of seed localization including benefits and alternatives. We discussed the high likelihood of a successful procedure. We discussed the risks of the procedure including infection, bleeding, tissue injury and further surgery. We discussed the low dose of radioactivity involved in the procedure.  Informed, written consent was given. The usual time-out protocol was performed immediately prior to the procedure. Using ultrasound guidance, sterile technique, 1% lidocaine and an I-125 radioactive seed, the lymph node in the LEFT axilla containing a biopsy clip was localized using a lateral approach. The follow-up mammogram images confirm the seed in the expected location and were marked for Dr.  Lucia Gaskins. Follow-up survey of the patient confirms presence of the radioactive seed. Order number of I-125 seed:  [id number]. Total activity:  7.078 millicuries reference Date: 06/09/2019 The patient tolerated the procedure well and was released from the [facility]. She was given instructions regarding seed removal. IMPRESSION: Radioactive seed localization left axilla. No apparent complications. Electronically Signed   By: Franki Cabot M.D.   On: 06/17/2019 09:50    Impression/Plan: Left breast cancer  We discussed adjuvant radiotherapy today.  I recommend 6 weeks directed at the left breast and lymph nodes in order to reduce risk of local regional recurrence by two thirds.  The risks, benefits and side effects of this treatment were discussed in detail.  She understands that radiotherapy is associated with skin irritation and fatigue in the acute setting. Late effects can include cosmetic changes and rare injury to internal organs.  She is enthusiastic about proceeding with treatment.   We will proceed with CT simulation in the near future and sign a consent form at that time.  A total of 5 medically necessary complex treatment devices will be fabricated and supervised by me: 4 fields with MLCs for custom blocks to protect heart, and lungs;  and, a Vac-lok. MORE COMPLEX DEVICES MAY BE MADE IN DOSIMETRY FOR FIELD IN FIELD BEAMS FOR DOSE HOMOGENEITY.  I have requested : 3D Simulation which is medically necessary to give adequate dose to at risk tissues while sparing lungs and heart.  I have requested a DVH of the following structures: lungs, heart, left lumpectomy cavity.    The patient will receive 50 Gy in 25 fractions to the left breast and regional nodes with 4 fields.  This will be  followed by a boost.  This encounter was provided by telemedicine platform by telephone as patient was unable to access MyChart video during pandemic precautions The patient has given verbal consent for this type of  encounter and has been advised to only accept a meeting of this type in a secure network environment. The time spent during this encounter was over 5 minutes. The attendants for this meeting include Eppie Gibson  and Avie Echevaria.  During the encounter, Eppie Gibson was located at [facility] Radiation Oncology Department.  SHANIYA TASHIRO was located at home.  ___   Eppie Gibson, MD   This document serves as a record of services personally performed by Eppie Gibson, MD. It was created on her behalf by Wilburn Mylar, a trained medical scribe. The creation of this record is based on the scribe's personal observations and the provider's statements to them. This document has been checked and approved by the attending provider.

## 2019-07-13 ENCOUNTER — Encounter: Payer: Self-pay | Admitting: Hematology

## 2019-07-13 ENCOUNTER — Ambulatory Visit
Admission: RE | Admit: 2019-07-13 | Discharge: 2019-07-13 | Disposition: A | Discharge status: Home / Self Care | Payer: Medicare Other | Source: Ambulatory Visit | Attending: Radiation Oncology | Admitting: Radiation Oncology

## 2019-07-13 ENCOUNTER — Other Ambulatory Visit: Payer: Self-pay

## 2019-07-13 ENCOUNTER — Encounter: Payer: Self-pay | Admitting: Radiation Oncology

## 2019-07-13 DIAGNOSIS — C50412 Malignant neoplasm of upper-outer quadrant of left female breast: Secondary | ICD-10-CM | POA: Diagnosis not present

## 2019-07-13 DIAGNOSIS — Z9889 Other specified postprocedural states: Secondary | ICD-10-CM | POA: Diagnosis not present

## 2019-07-13 DIAGNOSIS — C773 Secondary and unspecified malignant neoplasm of axilla and upper limb lymph nodes: Secondary | ICD-10-CM | POA: Diagnosis not present

## 2019-07-13 DIAGNOSIS — Z170 Estrogen receptor positive status [ER+]: Secondary | ICD-10-CM | POA: Diagnosis not present

## 2019-07-13 HISTORY — DX: Unspecified malignant neoplasm of skin, unspecified: C44.90

## 2019-07-16 ENCOUNTER — Encounter: Payer: Self-pay | Admitting: Radiation Oncology

## 2019-07-16 ENCOUNTER — Other Ambulatory Visit: Payer: Self-pay

## 2019-07-16 ENCOUNTER — Ambulatory Visit
Admission: RE | Admit: 2019-07-16 | Discharge: 2019-07-16 | Disposition: A | Discharge status: Home / Self Care | Payer: Medicare Other | Source: Ambulatory Visit | Attending: Radiation Oncology | Admitting: Radiation Oncology

## 2019-07-16 DIAGNOSIS — C50412 Malignant neoplasm of upper-outer quadrant of left female breast: Secondary | ICD-10-CM | POA: Diagnosis not present

## 2019-07-16 DIAGNOSIS — Z510 Encounter for antineoplastic radiation therapy: Secondary | ICD-10-CM | POA: Diagnosis not present

## 2019-07-16 DIAGNOSIS — Z170 Estrogen receptor positive status [ER+]: Secondary | ICD-10-CM | POA: Insufficient documentation

## 2019-07-16 NOTE — Progress Notes (Addendum)
Radiation Oncology         (336) [phone] ________________________________  Name: Courtney Dorsey MRN: [id number]  Date: 07/16/2019  DOB: 04/23/1946  SIMULATION AND TREATMENT PLANNING NOTE  // Special treatment procedure   Outpatient  DIAGNOSIS:     ICD-10-CM   1. Carcinoma of upper-outer quadrant of left breast in female, estrogen receptor positive ([place])  C50.412    Z17.0     NARRATIVE:  The patient was brought to the [facility].  Identity was confirmed.  All relevant records and images related to the planned course of therapy were reviewed.  The patient freely provided informed written consent to proceed with treatment after reviewing the details related to the planned course of therapy. The consent form was witnessed and verified by the simulation staff.    Then, the patient was set-up in a stable reproducible supine position for radiation therapy with her ipsilateral arm over her head, and her upper body secured in a custom-made Vac-lok device.  CT images were obtained.  Surface markings were placed.  The CT images were loaded into the planning software.    Special treatment procedure:  Special treatment procedure was performed today due to the extra time and effort required by myself to plan and prepare this patient for deep inspiration breath hold technique.  I have determined cardiac sparing to be of benefit to this patient to prevent long term cardiac damage due to radiation of the heart.  Bellows were placed on the patient's abdomen. To facilitate cardiac sparing, the patient was coached by the radiation therapists on breath hold techniques and breathing practice was performed. Practice waveforms were obtained. The patient was then scanned while maintaining breath hold in the treatment position.  This image was then transferred over to the imaging specialist. The imaging specialist then created a fusion of the free breathing and breath hold scans using the chest wall as  the stable structure. I personally reviewed the fusion in axial, coronal and sagittal image planes.  Excellent cardiac sparing was obtained.  I felt the patient is an appropriate candidate for breath hold and the patient will be treated as such.  The image fusion was then reviewed with the patient to reinforce the necessity of reproducible breath hold.  TREATMENT PLANNING NOTE: Treatment planning then occurred. The radiation prescription was entered and confirmed.     A total of 5 medically necessary complex treatment devices were fabricated and supervised by me: 4 fields with MLCs for custom blocks to protect heart, and lungs;  and, a Vac-lok. MORE COMPLEX DEVICES MAY BE MADE IN DOSIMETRY FOR FIELD IN FIELD BEAMS FOR DOSE HOMOGENEITY.  I have requested : 3D Simulation which is medically necessary to give adequate dose to at risk tissues while sparing lungs and heart.  I have requested a DVH of the following structures: lungs, heart, left lumpectomy cavity.    The patient will receive 50 Gy in 25 fractions to the left breast and regional nodes with 4 fields. This will be followed by a boost.  Optical Surface Tracking Plan:  Since intensity modulated radiotherapy (IMRT) and 3D conformal radiation treatment methods are predicated on accurate and precise positioning for treatment, intrafraction motion monitoring is medically necessary to ensure accurate and safe treatment delivery. The ability to quantify intrafraction motion without excessive ionizing radiation dose can only be performed with optical surface tracking. Accordingly, surface imaging offers the opportunity to obtain 3D measurements of patient position throughout IMRT and 3D treatments without  excessive radiation exposure. I am ordering optical surface tracking for this patient's upcoming course of radiotherapy.  ________________________________   Reference:  Ursula Alert, J, et al. Surface imaging-based analysis of  intrafraction motion for breast radiotherapy patients.Journal of [place], n. 6, nov. 2014. ISSN [id number].  Available at: <http://www.jacmp.org/index.php/jacmp/article/view/4957>.    -----------------------------------  Eppie Gibson, MD

## 2019-07-20 DIAGNOSIS — Z510 Encounter for antineoplastic radiation therapy: Secondary | ICD-10-CM | POA: Diagnosis not present

## 2019-07-20 DIAGNOSIS — Z170 Estrogen receptor positive status [ER+]: Secondary | ICD-10-CM | POA: Diagnosis not present

## 2019-07-20 DIAGNOSIS — C50412 Malignant neoplasm of upper-outer quadrant of left female breast: Secondary | ICD-10-CM | POA: Diagnosis not present

## 2019-07-21 ENCOUNTER — Other Ambulatory Visit: Payer: Self-pay

## 2019-07-21 ENCOUNTER — Ambulatory Visit
Admission: RE | Admit: 2019-07-21 | Discharge: 2019-07-21 | Disposition: A | Discharge status: Home / Self Care | Payer: Medicare Other | Source: Ambulatory Visit | Attending: Radiation Oncology | Admitting: Radiation Oncology

## 2019-07-21 DIAGNOSIS — C50412 Malignant neoplasm of upper-outer quadrant of left female breast: Secondary | ICD-10-CM | POA: Diagnosis not present

## 2019-07-21 DIAGNOSIS — Z510 Encounter for antineoplastic radiation therapy: Secondary | ICD-10-CM | POA: Diagnosis not present

## 2019-07-21 DIAGNOSIS — Z170 Estrogen receptor positive status [ER+]: Secondary | ICD-10-CM | POA: Diagnosis not present

## 2019-07-22 ENCOUNTER — Ambulatory Visit
Admission: RE | Admit: 2019-07-22 | Discharge: 2019-07-22 | Disposition: A | Discharge status: Home / Self Care | Payer: Medicare Other | Source: Ambulatory Visit | Attending: Radiation Oncology | Admitting: Radiation Oncology

## 2019-07-22 ENCOUNTER — Other Ambulatory Visit: Payer: Self-pay

## 2019-07-22 DIAGNOSIS — Z170 Estrogen receptor positive status [ER+]: Secondary | ICD-10-CM | POA: Diagnosis not present

## 2019-07-22 DIAGNOSIS — Z510 Encounter for antineoplastic radiation therapy: Secondary | ICD-10-CM | POA: Diagnosis not present

## 2019-07-22 DIAGNOSIS — C50412 Malignant neoplasm of upper-outer quadrant of left female breast: Secondary | ICD-10-CM | POA: Diagnosis not present

## 2019-07-23 ENCOUNTER — Ambulatory Visit
Admission: RE | Admit: 2019-07-23 | Discharge: 2019-07-23 | Disposition: A | Discharge status: Home / Self Care | Payer: Medicare Other | Source: Ambulatory Visit | Attending: Radiation Oncology | Admitting: Radiation Oncology

## 2019-07-23 ENCOUNTER — Other Ambulatory Visit: Payer: Self-pay

## 2019-07-23 DIAGNOSIS — Z510 Encounter for antineoplastic radiation therapy: Secondary | ICD-10-CM | POA: Diagnosis not present

## 2019-07-23 DIAGNOSIS — C50412 Malignant neoplasm of upper-outer quadrant of left female breast: Secondary | ICD-10-CM | POA: Diagnosis not present

## 2019-07-23 DIAGNOSIS — Z170 Estrogen receptor positive status [ER+]: Secondary | ICD-10-CM | POA: Diagnosis not present

## 2019-07-25 ENCOUNTER — Ambulatory Visit
Admission: RE | Admit: 2019-07-25 | Discharge: 2019-07-25 | Disposition: A | Discharge status: Home / Self Care | Payer: Medicare Other | Source: Ambulatory Visit | Attending: Radiation Oncology | Admitting: Radiation Oncology

## 2019-07-25 ENCOUNTER — Other Ambulatory Visit: Payer: Self-pay

## 2019-07-25 DIAGNOSIS — C50412 Malignant neoplasm of upper-outer quadrant of left female breast: Secondary | ICD-10-CM | POA: Diagnosis not present

## 2019-07-25 DIAGNOSIS — Z170 Estrogen receptor positive status [ER+]: Secondary | ICD-10-CM | POA: Diagnosis not present

## 2019-07-25 DIAGNOSIS — Z510 Encounter for antineoplastic radiation therapy: Secondary | ICD-10-CM | POA: Diagnosis not present

## 2019-07-26 ENCOUNTER — Ambulatory Visit
Admission: RE | Admit: 2019-07-26 | Discharge: 2019-07-26 | Disposition: A | Discharge status: Home / Self Care | Payer: Medicare Other | Source: Ambulatory Visit | Attending: Radiation Oncology | Admitting: Radiation Oncology

## 2019-07-26 ENCOUNTER — Other Ambulatory Visit: Payer: Self-pay

## 2019-07-26 DIAGNOSIS — Z510 Encounter for antineoplastic radiation therapy: Secondary | ICD-10-CM | POA: Diagnosis not present

## 2019-07-26 DIAGNOSIS — Z170 Estrogen receptor positive status [ER+]: Secondary | ICD-10-CM | POA: Diagnosis not present

## 2019-07-26 DIAGNOSIS — C50412 Malignant neoplasm of upper-outer quadrant of left female breast: Secondary | ICD-10-CM | POA: Diagnosis not present

## 2019-07-27 ENCOUNTER — Encounter (HOSPITAL_COMMUNITY): Payer: Self-pay | Admitting: Hematology

## 2019-07-27 ENCOUNTER — Ambulatory Visit
Admission: RE | Admit: 2019-07-27 | Discharge: 2019-07-27 | Disposition: A | Discharge status: Home / Self Care | Payer: Medicare Other | Source: Ambulatory Visit | Attending: Radiation Oncology | Admitting: Radiation Oncology

## 2019-07-27 ENCOUNTER — Other Ambulatory Visit: Payer: Self-pay

## 2019-07-27 DIAGNOSIS — Z170 Estrogen receptor positive status [ER+]: Secondary | ICD-10-CM | POA: Diagnosis not present

## 2019-07-27 DIAGNOSIS — C50412 Malignant neoplasm of upper-outer quadrant of left female breast: Secondary | ICD-10-CM | POA: Diagnosis not present

## 2019-07-27 DIAGNOSIS — Z510 Encounter for antineoplastic radiation therapy: Secondary | ICD-10-CM | POA: Diagnosis not present

## 2019-07-27 MED ORDER — SONAFINE EX EMUL
1.0000 "application " | Freq: Once | CUTANEOUS | Status: AC
  Administered 2019-07-27: 1 via TOPICAL

## 2019-07-27 MED ORDER — ALRA NON-METALLIC DEODORANT (RAD-ONC)
1.0000 "application " | Freq: Once | TOPICAL | Status: AC
  Administered 2019-07-27: 1 via TOPICAL

## 2019-07-27 NOTE — Progress Notes (Signed)

## 2019-07-28 ENCOUNTER — Other Ambulatory Visit: Payer: Self-pay

## 2019-07-28 ENCOUNTER — Ambulatory Visit
Admission: RE | Admit: 2019-07-28 | Discharge: 2019-07-28 | Disposition: A | Discharge status: Home / Self Care | Payer: Medicare Other | Source: Ambulatory Visit | Attending: Radiation Oncology | Admitting: Radiation Oncology

## 2019-07-28 DIAGNOSIS — Z170 Estrogen receptor positive status [ER+]: Secondary | ICD-10-CM | POA: Diagnosis not present

## 2019-07-28 DIAGNOSIS — C50412 Malignant neoplasm of upper-outer quadrant of left female breast: Secondary | ICD-10-CM | POA: Diagnosis not present

## 2019-07-28 DIAGNOSIS — Z510 Encounter for antineoplastic radiation therapy: Secondary | ICD-10-CM | POA: Diagnosis not present

## 2019-08-02 ENCOUNTER — Ambulatory Visit
Admission: RE | Admit: 2019-08-02 | Discharge: 2019-08-02 | Disposition: A | Discharge status: Home / Self Care | Payer: Medicare Other | Source: Ambulatory Visit | Attending: Radiation Oncology | Admitting: Radiation Oncology

## 2019-08-02 ENCOUNTER — Other Ambulatory Visit: Payer: Self-pay

## 2019-08-02 DIAGNOSIS — Z510 Encounter for antineoplastic radiation therapy: Secondary | ICD-10-CM | POA: Diagnosis not present

## 2019-08-02 DIAGNOSIS — C50412 Malignant neoplasm of upper-outer quadrant of left female breast: Secondary | ICD-10-CM | POA: Diagnosis not present

## 2019-08-02 DIAGNOSIS — Z170 Estrogen receptor positive status [ER+]: Secondary | ICD-10-CM | POA: Diagnosis not present

## 2019-08-03 ENCOUNTER — Ambulatory Visit
Admission: RE | Admit: 2019-08-03 | Discharge: 2019-08-03 | Disposition: A | Discharge status: Home / Self Care | Payer: Medicare Other | Source: Ambulatory Visit | Attending: Radiation Oncology | Admitting: Radiation Oncology

## 2019-08-03 ENCOUNTER — Other Ambulatory Visit: Payer: Self-pay

## 2019-08-03 DIAGNOSIS — C50412 Malignant neoplasm of upper-outer quadrant of left female breast: Secondary | ICD-10-CM | POA: Diagnosis not present

## 2019-08-03 DIAGNOSIS — Z510 Encounter for antineoplastic radiation therapy: Secondary | ICD-10-CM | POA: Insufficient documentation

## 2019-08-03 DIAGNOSIS — Z170 Estrogen receptor positive status [ER+]: Secondary | ICD-10-CM | POA: Diagnosis present

## 2019-08-04 ENCOUNTER — Ambulatory Visit
Admission: RE | Admit: 2019-08-04 | Discharge: 2019-08-04 | Disposition: A | Discharge status: Home / Self Care | Payer: Medicare Other | Source: Ambulatory Visit | Attending: Radiation Oncology | Admitting: Radiation Oncology

## 2019-08-04 ENCOUNTER — Other Ambulatory Visit: Payer: Self-pay

## 2019-08-04 DIAGNOSIS — C50412 Malignant neoplasm of upper-outer quadrant of left female breast: Secondary | ICD-10-CM | POA: Diagnosis not present

## 2019-08-05 ENCOUNTER — Ambulatory Visit
Admission: RE | Admit: 2019-08-05 | Discharge: 2019-08-05 | Disposition: A | Discharge status: Home / Self Care | Payer: Medicare Other | Source: Ambulatory Visit | Attending: Radiation Oncology | Admitting: Radiation Oncology

## 2019-08-05 DIAGNOSIS — C50412 Malignant neoplasm of upper-outer quadrant of left female breast: Secondary | ICD-10-CM | POA: Diagnosis not present

## 2019-08-06 ENCOUNTER — Other Ambulatory Visit: Payer: Self-pay

## 2019-08-06 ENCOUNTER — Ambulatory Visit
Admission: RE | Admit: 2019-08-06 | Discharge: 2019-08-06 | Disposition: A | Discharge status: Home / Self Care | Payer: Medicare Other | Source: Ambulatory Visit | Attending: Radiation Oncology | Admitting: Radiation Oncology

## 2019-08-06 DIAGNOSIS — C50412 Malignant neoplasm of upper-outer quadrant of left female breast: Secondary | ICD-10-CM | POA: Diagnosis not present

## 2019-08-09 ENCOUNTER — Other Ambulatory Visit: Payer: Self-pay

## 2019-08-09 ENCOUNTER — Ambulatory Visit
Admission: RE | Admit: 2019-08-09 | Discharge: 2019-08-09 | Disposition: A | Discharge status: Home / Self Care | Payer: Medicare Other | Source: Ambulatory Visit | Attending: Radiation Oncology | Admitting: Radiation Oncology

## 2019-08-09 DIAGNOSIS — C50412 Malignant neoplasm of upper-outer quadrant of left female breast: Secondary | ICD-10-CM | POA: Diagnosis not present

## 2019-08-10 ENCOUNTER — Ambulatory Visit
Admission: RE | Admit: 2019-08-10 | Discharge: 2019-08-10 | Disposition: A | Discharge status: Home / Self Care | Payer: Medicare Other | Source: Ambulatory Visit | Attending: Radiation Oncology | Admitting: Radiation Oncology

## 2019-08-10 ENCOUNTER — Other Ambulatory Visit: Payer: Self-pay

## 2019-08-10 DIAGNOSIS — C50412 Malignant neoplasm of upper-outer quadrant of left female breast: Secondary | ICD-10-CM | POA: Diagnosis not present

## 2019-08-11 ENCOUNTER — Other Ambulatory Visit: Payer: Self-pay

## 2019-08-11 ENCOUNTER — Ambulatory Visit
Admission: RE | Admit: 2019-08-11 | Discharge: 2019-08-11 | Disposition: A | Discharge status: Home / Self Care | Payer: Medicare Other | Source: Ambulatory Visit | Attending: Radiation Oncology | Admitting: Radiation Oncology

## 2019-08-11 DIAGNOSIS — C50412 Malignant neoplasm of upper-outer quadrant of left female breast: Secondary | ICD-10-CM | POA: Diagnosis not present

## 2019-08-12 ENCOUNTER — Ambulatory Visit
Admission: RE | Admit: 2019-08-12 | Discharge: 2019-08-12 | Disposition: A | Discharge status: Home / Self Care | Payer: Medicare Other | Source: Ambulatory Visit | Attending: Radiation Oncology | Admitting: Radiation Oncology

## 2019-08-12 ENCOUNTER — Other Ambulatory Visit: Payer: Self-pay

## 2019-08-12 DIAGNOSIS — C50412 Malignant neoplasm of upper-outer quadrant of left female breast: Secondary | ICD-10-CM | POA: Diagnosis not present

## 2019-08-13 ENCOUNTER — Ambulatory Visit
Admission: RE | Admit: 2019-08-13 | Discharge: 2019-08-13 | Disposition: A | Discharge status: Home / Self Care | Payer: Medicare Other | Source: Ambulatory Visit | Attending: Radiation Oncology | Admitting: Radiation Oncology

## 2019-08-13 ENCOUNTER — Other Ambulatory Visit: Payer: Self-pay

## 2019-08-13 DIAGNOSIS — C50412 Malignant neoplasm of upper-outer quadrant of left female breast: Secondary | ICD-10-CM | POA: Diagnosis not present

## 2019-08-16 ENCOUNTER — Ambulatory Visit
Admission: RE | Admit: 2019-08-16 | Discharge: 2019-08-16 | Disposition: A | Discharge status: Home / Self Care | Payer: Medicare Other | Source: Ambulatory Visit | Attending: Radiation Oncology | Admitting: Radiation Oncology

## 2019-08-16 ENCOUNTER — Other Ambulatory Visit: Payer: Self-pay

## 2019-08-16 DIAGNOSIS — Z170 Estrogen receptor positive status [ER+]: Secondary | ICD-10-CM

## 2019-08-16 DIAGNOSIS — C50412 Malignant neoplasm of upper-outer quadrant of left female breast: Secondary | ICD-10-CM | POA: Diagnosis not present

## 2019-08-16 MED ORDER — SONAFINE EX EMUL
1.0000 "application " | Freq: Once | CUTANEOUS | Status: AC
  Administered 2019-08-16: 1 via TOPICAL

## 2019-08-17 ENCOUNTER — Other Ambulatory Visit: Payer: Self-pay

## 2019-08-17 ENCOUNTER — Ambulatory Visit
Admission: RE | Admit: 2019-08-17 | Discharge: 2019-08-17 | Disposition: A | Discharge status: Home / Self Care | Payer: Medicare Other | Source: Ambulatory Visit | Attending: Radiation Oncology | Admitting: Radiation Oncology

## 2019-08-17 DIAGNOSIS — C50412 Malignant neoplasm of upper-outer quadrant of left female breast: Secondary | ICD-10-CM | POA: Diagnosis not present

## 2019-08-18 ENCOUNTER — Ambulatory Visit
Admission: RE | Admit: 2019-08-18 | Discharge: 2019-08-18 | Disposition: A | Discharge status: Home / Self Care | Payer: Medicare Other | Source: Ambulatory Visit | Attending: Radiation Oncology | Admitting: Radiation Oncology

## 2019-08-18 ENCOUNTER — Other Ambulatory Visit: Payer: Self-pay

## 2019-08-18 DIAGNOSIS — C50412 Malignant neoplasm of upper-outer quadrant of left female breast: Secondary | ICD-10-CM | POA: Diagnosis not present

## 2019-08-19 ENCOUNTER — Other Ambulatory Visit: Payer: Self-pay

## 2019-08-19 ENCOUNTER — Ambulatory Visit
Admission: RE | Admit: 2019-08-19 | Discharge: 2019-08-19 | Disposition: A | Discharge status: Home / Self Care | Payer: Medicare Other | Source: Ambulatory Visit | Attending: Radiation Oncology | Admitting: Radiation Oncology

## 2019-08-19 DIAGNOSIS — C50412 Malignant neoplasm of upper-outer quadrant of left female breast: Secondary | ICD-10-CM | POA: Diagnosis not present

## 2019-08-20 ENCOUNTER — Other Ambulatory Visit: Payer: Self-pay

## 2019-08-20 ENCOUNTER — Ambulatory Visit
Admission: RE | Admit: 2019-08-20 | Discharge: 2019-08-20 | Disposition: A | Discharge status: Home / Self Care | Payer: Medicare Other | Source: Ambulatory Visit | Attending: Radiation Oncology | Admitting: Radiation Oncology

## 2019-08-20 DIAGNOSIS — C50412 Malignant neoplasm of upper-outer quadrant of left female breast: Secondary | ICD-10-CM | POA: Diagnosis not present

## 2019-08-23 ENCOUNTER — Encounter: Payer: Self-pay | Admitting: *Deleted

## 2019-08-23 ENCOUNTER — Other Ambulatory Visit: Payer: Self-pay

## 2019-08-23 ENCOUNTER — Ambulatory Visit
Admission: RE | Admit: 2019-08-23 | Discharge: 2019-08-23 | Disposition: A | Discharge status: Home / Self Care | Payer: Medicare Other | Source: Ambulatory Visit | Attending: Radiation Oncology | Admitting: Radiation Oncology

## 2019-08-23 DIAGNOSIS — C50412 Malignant neoplasm of upper-outer quadrant of left female breast: Secondary | ICD-10-CM | POA: Diagnosis not present

## 2019-08-24 ENCOUNTER — Ambulatory Visit
Admission: RE | Admit: 2019-08-24 | Discharge: 2019-08-24 | Disposition: A | Discharge status: Home / Self Care | Payer: Medicare Other | Source: Ambulatory Visit | Attending: Radiation Oncology | Admitting: Radiation Oncology

## 2019-08-24 ENCOUNTER — Other Ambulatory Visit: Payer: Self-pay

## 2019-08-24 ENCOUNTER — Telehealth: Payer: Self-pay | Admitting: Hematology

## 2019-08-24 DIAGNOSIS — C50412 Malignant neoplasm of upper-outer quadrant of left female breast: Secondary | ICD-10-CM | POA: Diagnosis not present

## 2019-08-24 NOTE — Telephone Encounter (Signed)
Scheduled appt per 12/21 sch message  - pt husband aware of appt date and time

## 2019-08-25 ENCOUNTER — Other Ambulatory Visit: Payer: Self-pay

## 2019-08-25 ENCOUNTER — Ambulatory Visit
Admission: RE | Admit: 2019-08-25 | Discharge: 2019-08-25 | Disposition: A | Discharge status: Home / Self Care | Payer: Medicare Other | Source: Ambulatory Visit | Attending: Radiation Oncology | Admitting: Radiation Oncology

## 2019-08-25 DIAGNOSIS — C50412 Malignant neoplasm of upper-outer quadrant of left female breast: Secondary | ICD-10-CM | POA: Diagnosis not present

## 2019-08-26 ENCOUNTER — Other Ambulatory Visit: Payer: Self-pay

## 2019-08-26 ENCOUNTER — Ambulatory Visit
Admission: RE | Admit: 2019-08-26 | Discharge: 2019-08-26 | Disposition: A | Discharge status: Home / Self Care | Payer: Medicare Other | Source: Ambulatory Visit | Attending: Radiation Oncology | Admitting: Radiation Oncology

## 2019-08-26 DIAGNOSIS — C50412 Malignant neoplasm of upper-outer quadrant of left female breast: Secondary | ICD-10-CM | POA: Diagnosis not present

## 2019-08-30 ENCOUNTER — Ambulatory Visit
Admission: RE | Admit: 2019-08-30 | Discharge: 2019-08-30 | Disposition: A | Discharge status: Home / Self Care | Payer: Medicare Other | Source: Ambulatory Visit | Attending: Radiation Oncology | Admitting: Radiation Oncology

## 2019-08-30 ENCOUNTER — Other Ambulatory Visit: Payer: Self-pay

## 2019-08-30 DIAGNOSIS — C50412 Malignant neoplasm of upper-outer quadrant of left female breast: Secondary | ICD-10-CM | POA: Diagnosis not present

## 2019-08-31 ENCOUNTER — Other Ambulatory Visit: Payer: Self-pay

## 2019-08-31 ENCOUNTER — Ambulatory Visit
Admission: RE | Admit: 2019-08-31 | Discharge: 2019-08-31 | Disposition: A | Discharge status: Home / Self Care | Payer: Medicare Other | Source: Ambulatory Visit | Attending: Radiation Oncology | Admitting: Radiation Oncology

## 2019-08-31 DIAGNOSIS — C50412 Malignant neoplasm of upper-outer quadrant of left female breast: Secondary | ICD-10-CM | POA: Diagnosis not present

## 2019-09-01 ENCOUNTER — Other Ambulatory Visit: Payer: Self-pay

## 2019-09-01 ENCOUNTER — Ambulatory Visit
Admission: RE | Admit: 2019-09-01 | Discharge: 2019-09-01 | Disposition: A | Discharge status: Home / Self Care | Payer: Medicare Other | Source: Ambulatory Visit | Attending: Radiation Oncology | Admitting: Radiation Oncology

## 2019-09-01 DIAGNOSIS — C50412 Malignant neoplasm of upper-outer quadrant of left female breast: Secondary | ICD-10-CM | POA: Diagnosis not present

## 2019-09-02 ENCOUNTER — Encounter: Payer: Self-pay | Admitting: Radiation Oncology

## 2019-09-02 ENCOUNTER — Other Ambulatory Visit: Payer: Self-pay

## 2019-09-02 ENCOUNTER — Ambulatory Visit
Admission: RE | Admit: 2019-09-02 | Discharge: 2019-09-02 | Disposition: A | Discharge status: Home / Self Care | Payer: Medicare Other | Source: Ambulatory Visit | Attending: Radiation Oncology | Admitting: Radiation Oncology

## 2019-09-02 ENCOUNTER — Encounter: Payer: Self-pay | Admitting: *Deleted

## 2019-09-02 DIAGNOSIS — C50412 Malignant neoplasm of upper-outer quadrant of left female breast: Secondary | ICD-10-CM | POA: Diagnosis not present

## 2019-09-06 ENCOUNTER — Ambulatory Visit: Payer: Medicare Other

## 2019-09-09 NOTE — Progress Notes (Signed)
[place]   [phone] [phone]   Clinic Follow up Note   Patient Care Team: Sharilyn Sites, MD as PCP - General (Family Medicine) Gala Romney, Cristopher Estimable, MD as Consulting Physician (Gastroenterology) Alphonsa Overall, MD as Consulting Physician (General Surgery) Eppie Gibson, MD as Attending Physician (Radiation Oncology) Truitt Merle, MD as Consulting Physician (Hematology) Mauro Kaufmann, RN as Oncology Nurse Navigator Rockwell Germany, RN as Oncology Nurse Navigator  Date of Service:  09/10/2019  CHIEF COMPLAINT: F/u of left breast cancer   SUMMARY OF ONCOLOGIC HISTORY: Oncology History Overview Note  Cancer Staging Carcinoma of upper-outer quadrant of left breast in female, estrogen receptor positive ([place]) Staging form: Breast, AJCC 8th Edition - Clinical stage from 05/05/2019: Stage IB (cT1c, cN1, cM0, G2, ER+, PR+, HER2-) - Signed by Eppie Gibson, MD on 05/05/2019 - Pathologic stage from 06/17/2019: Stage IA (pT1b, pN1a, cM0, G2, ER+, PR+, HER2-) - Signed by Truitt Merle, MD on 09/10/2019    Carcinoma of upper-outer quadrant of left breast in female, estrogen receptor positive ([place])  04/06/2019 Mammogram   Diagnostic mammogram 04/06/19  IMPRESSION: 1. 2 x 1.6 x 1.9cm irregular breast mass with associated architectural distortion in the 2 o'clock position of the left breast, 6 cm from the nipple corresponding to the palpable abnormality. This is highly suspicious for breast malignancy. 2. Small probably benign mass with central calcifications in the 7 o'clock position of the breast, measuring 7x4x7 mm. This likely corresponds to a focal opacity with calcifications seen mammographically that has been stable. 3. Single abnormal left axillary lymph node with a cortical thickness of 6 mm.   04/13/2019 Initial Biopsy   Diagnosis 04/13/19 1. Breast, left, needle core biopsy, upper outer quadrant at 2:00 - INVASIVE MAMMARY CARCINOMA, GRADE II. 2. Lymph node,  needle/core biopsy, left axilla - METASTATIC CARCINOMA.   04/13/2019 Receptors her2   Results: GROUP 5: HER2 **NEGATIVE** Estrogen Receptor: 100%, POSITIVE, STRONG STAINING INTENSITY Progesterone Receptor: 5%, POSITIVE, STRONG STAINING INTENSITY Proliferation Marker Ki67: 15%   05/05/2019 Initial Diagnosis   Carcinoma of upper-outer quadrant of left breast in female, estrogen receptor positive ([place])   05/05/2019 Cancer Staging   Staging form: Breast, AJCC 8th Edition - Clinical stage from 05/05/2019: Stage IB (cT1c, cN1, cM0, G2, ER+, PR+, HER2-) - Signed by Eppie Gibson, MD on 05/05/2019   05/18/2019 Pathology Results   FINAL MICROSCOPIC DIAGNOSIS: 05/18/19 A. BREAST, LEFT, BIOPSY:  - Invasive ductal carcinoma, grade 2.  See comment  - Ductal carcinoma in situ, intermediate grade    06/17/2019 Surgery   LEFT BREAST LUMPECTOMY X2 WITH RADIOACTIVE SEED X2 AND LEFT SEED TARGETED AXILLARY LYMPH NODE EXCISION AND SENTINEL LYMPH NODE EXCISION  by Dr Lucia Gaskins 06/17/19   06/17/2019 Pathology Results   FINAL MICROSCOPIC DIAGNOSIS: 06/17/19  A. BREAST, LEFT, 7 O'CLOCK, LUMPECTOMY:  - Invasive ductal carcinoma, 0.7 cm, Nottingham grade 2 of 3.  - Margins of resection are not involved (Closest margin: Less than 1 mm,  Superior/medial).  - See oncology table.   B. BREAST, LEFT, 2 O'CLOCK, LUMPECTOMY:  - Invasive ductal carcinoma, 2.8 cm, Nottingham grade 2 of 3.  - Margins of resection are not involved (Closest margin: Less than 1 mm,  Posterior).  - Ductal carcinoma in situ.  - See oncology table.   C. BREAST, LEFT, ADDITIONAL INFERIOR MARGIN, 7 O'CLOCK, EXCISION:  - Breast tissue, negative for carcinoma.   D. SENTINEL LYMPH NODE, LEFT AXILLARY, BIOPSY:  - Metastatic carcinoma present in one  of one lymph node (1/1).  - Biopsy site changes.   E. SENTINEL LYMPH NODE, LEFT AXILLARY, BIOPSY:  - Metastatic carcinoma present in one of one lymph node (1/1).   F. SENTINEL LYMPH NODE, LEFT  AXILLARY, BIOPSY:  - One lymph node, negative for carcinoma (0/1).      06/17/2019 Miscellaneous   Mammaprint  Low Risk Luminal Type A with 10% risk of recurrence in 10 years.  Index +0.436 97.8% benefit of hormonal therapy.    06/17/2019 Cancer Staging   Staging form: Breast, AJCC 8th Edition - Pathologic stage from 06/17/2019: Stage IA (pT1b, pN1a, cM0, G2, ER+, PR+, HER2-) - Signed by Truitt Merle, MD on 09/10/2019   07/21/2019 - 09/02/2019 Radiation Therapy   Adjuvant radiation with Dr Isidore Moos 07/21/19-09/02/19   09/2019 -  Anti-estrogen oral therapy   Anastrozole 39m daily starting 09/2019       CURRENT THERAPY:  Anastrozole 149mdaily starting 09/2019   INTERVAL HISTORY:  [name] here for a follow up of left breast cancer. She presents to the clinic alone. She notes she tolerated radiation well. She only has mild skin redness, no fatigue. She notes her menopause was tolerable.     REVIEW OF SYSTEMS:   Constitutional: Denies fevers, chills or abnormal weight loss Eyes: Denies blurriness of vision Ears, nose, mouth, throat, and face: Denies mucositis or sore throat Respiratory: Denies cough, dyspnea or wheezes Cardiovascular: Denies palpitation, chest discomfort or lower extremity swelling Gastrointestinal:  Denies nausea, heartburn or change in bowel habits Skin: Denies abnormal skin rashes Lymphatics: Denies new lymphadenopathy or easy bruising Neurological:Denies numbness, tingling or new weaknesses Behavioral/Psych: Mood is stable, no new changes  All other systems were reviewed with the patient and are negative.  MEDICAL HISTORY:  Past Medical History:  Diagnosis Date  . Anemia   . [place]   "BORDERLINE", off all medication  . Diverticulosis   . Heart murmur    childhood   . History of hiatal hernia   . History of skin cancer   . History of transfusion   . Hypercholesteremia   . Hypertension   . Skin cancer     . UTI (lower urinary tract infection)    TAKING ANTIBIOTICS    SURGICAL HISTORY: Past Surgical History:  Procedure Laterality Date  . BREAST LUMPECTOMY WITH RADIOACTIVE SEED AND AXILLARY LYMPH NODE DISSECTION Left 06/17/2019   Procedure: LEFT BREAST LUMPECTOMY X2  WITH RADIOACTIVE SEED X2 AND LEFT SEED TARGETED AXILLARY LYMPH NODE EXCISION AND SENTINEL LYMPH NODE EXCISION;  Surgeon: NeAlphonsa OverallMD;  [facility] Service: General;  Laterality: Left;  . CHOLECYSTECTOMY    . COLONOSCOPY  2009   Dr. MaAviva Signs. COLONOSCOPY N/A 01/01/2013   SLZOX:WRUEAVucosa in the terminal ileum  Mild diverticulosis in the descending colon and sigmoid colon and sigmoid colon/Moderate sized internal hemorrhoids  . ESOPHAGOGASTRODUODENOSCOPY N/A 01/01/2013   SLWUJ:WJXBJiatal hernia/ MODERATE SIZE PARA-ESOPHAGEAL HERNIA/MILD Erosive gastritis. chronic duodenitis c/w peptic duodenitis. no h.pylori or villous atrophy. minimal chronic gastritis.   . Marland KitchenSOPHAGOGASTRODUODENOSCOPY N/A 02/16/2013   Procedure: ESOPHAGOGASTRODUODENOSCOPY (EGD);  Surgeon: RoDaneil DolinMD;  [facility];  Service: Endoscopy;  Laterality: N/A;  8:30  . GIVENS CAPSULE STUDY N/A 02/16/2013   EGD with capsule placement. no evidence of paraesophageal hernia. SB essentially unremarkable.  . Marland KitchenIATAL HERNIA REPAIR N/A 03/21/2017   Procedure: LAPAROSCOPIC lysis of adhesions,reduction hiatal hernia, upper endoscopy,gastropexy;  Surgeon: Johnathan Hausen, MD;  [facility]: [facility];  Service: General;  Laterality: N/A;  . KNOT REMOVED FROM SCALP    . LAPAROSCOPIC NISSEN FUNDOPLICATION N/A 0/09/930   Procedure: LAPAROSCOPIC NISSEN FUNDOPLICATION AND HIATAL HERNIA REPAIR;  Surgeon: Johnathan Hausen, MD;  Location: [facility];  Service: General;  Laterality: N/A;    I have reviewed the social history and family history with the patient and they are unchanged from previous note.  ALLERGIES:  has No Known  Allergies.  MEDICATIONS:  Current Outpatient Medications  Medication Sig Dispense Refill  . acetaminophen (TYLENOL) 500 MG tablet Take 1,000 mg by mouth daily as needed for headache.    Marland Kitchen aspirin 81 MG tablet Take 81 mg by mouth every morning.     Marland Kitchen atorvastatin (LIPITOR) 80 MG tablet Take 40 mg by mouth every evening.    . cimetidine (TAGAMET) 200 MG tablet Take 200 mg by mouth 2 (two) times daily.    . enalapril (VASOTEC) 10 MG tablet Take 10 mg by mouth daily.     Marland Kitchen HYDROcodone-acetaminophen (NORCO/VICODIN) 5-325 MG tablet Take 1 tablet by mouth every 6 (six) hours as needed for moderate pain. 15 tablet 0  . ondansetron (ZOFRAN-ODT) 4 MG disintegrating tablet Take 1 tablet (4 mg total) by mouth every 6 (six) hours as needed for nausea. 20 tablet 0  . Simethicone (GAS-X PO) Take 1-2 tablets by mouth 2 (two) times daily as needed (stomach irritation).    Marland Kitchen anastrozole (ARIMIDEX) 1 MG tablet Take 1 tablet (1 mg total) by mouth daily. 30 tablet 3   No current facility-administered medications for this visit.    PHYSICAL EXAMINATION: ECOG PERFORMANCE STATUS: 0 - Asymptomatic  Vitals:   09/10/19 1315  BP: (!) 156/77  Pulse: 86  Resp: 20  Temp: 98.2 F (36.8 C)  SpO2: 98%   Filed Weights   09/10/19 1315  Weight: 144 lb 9.6 oz (65.6 kg)    GENERAL:alert, no distress and comfortable SKIN: skin color, texture, turgor are normal, no rashes or significant lesions EYES: normal, Conjunctiva are pink and non-injected, sclera clear  NECK: supple, thyroid normal size, non-tender, without nodularity LYMPH:  no palpable lymphadenopathy in the cervical, axillary  LUNGS: clear to auscultation and percussion with normal breathing effort HEART: regular rate & rhythm and no murmurs and no lower extremity edema ABDOMEN:abdomen soft, non-tender and normal bowel sounds Musculoskeletal:no cyanosis of digits and no clubbing  NEURO: alert & oriented x 3 with fluent speech, no focal motor/sensory  deficits BREAST: S/p left double lumpectomy: Surgical incision healed well (+) Skin hyperpigmentation with erythema of left breast, mainly in middle and lower portion from RT. No palpable mass, nodules or adenopathy bilaterally. Breast exam benign.    LABORATORY DATA:  I have reviewed the data as listed CBC Latest Ref Rng & Units 03/22/2017 03/21/2017 03/14/2017  WBC 4.0 - 10.5 K/uL 12.6(H) 19.6(H) 9.1  Hemoglobin 12.0 - 15.0 g/dL 13.2 14.4 15.5(H)  Hematocrit 36.0 - 46.0 % 41.2 43.8 45.9  Platelets 150 - 400 K/uL 206 200 235     CMP Latest Ref Rng & Units 03/22/2017 03/21/2017 03/14/2017  Glucose 65 - 99 mg/dL 107(H) - 101(H)  BUN 6 - 20 mg/dL 14 - 13  Creatinine 0.44 - 1.00 mg/dL 0.80 0.81 0.70  Sodium 135 - 145 mmol/L 142 - 140  Potassium 3.5 - 5.1 mmol/L 4.0 - 4.3  Chloride 101 - 111 mmol/L 106 - 105  CO2 22 - 32 mmol/L 27 - 26  Calcium 8.9 - 10.3 mg/dL 8.8(L) - 9.3  Total Protein 6.5 - 8.1 g/dL - - -  Total Bilirubin 0.3 - 1.2 mg/dL - - -  Alkaline Phos 38 - 126 U/L - - -  AST 15 - 41 U/L - - -  ALT 14 - 54 U/L - - -      RADIOGRAPHIC STUDIES: I have personally reviewed the radiological images as listed and agreed with the findings in the report. No results found.   ASSESSMENT & PLAN:  TIJAH HANE is a 74 y.o. 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 Her 2:00 position left breast and LN biopsy showed invasive ductal carcinoma with node metastasis. Low Ki 67 15%. Her 7:00 position left breast lesion biopsy on 05/18/19 showed invasive ductal carcinoma, grade 2.  -She underwent left breast double lumpectomy and LN excision. Path showed complete surgical resection with 2/3 positive lymph nodes, negative margins.  -Her mammaprint returned low risk. Adjuvant chemo was not recommended.  -Given her positive LN she underwent adjuvant radiation to  reduce her risk of local recurrence. Tolerated well.  -Given the strong ER and PR expression in her postmenopausal status, I recommend adjuvant endocrine therapy with aromatase inhibitor Anastrozole for a total of 5-10 years to reduce the risk of cancer recurrence.   -The potential benefit and side effects, which includes but not limited to, hot flash, skin and vaginal dryness, metabolic changes ( increased blood glucose, cholesterol, weight, etc.), slightly in increased risk of cardiovascular disease, cataracts, muscular and joint discomfort, osteopenia and osteoporosis, etc, were discussed with her in great details. She is interested, Plan to start this month. I will call in anastrozole today  -We also discussed the breast cancer surveillance after her surgery. She will continue annual screening mammogram, self exam, and a routine office visit with lab and exam with [place]. -I offered her Survivorship visit with NP Lacie, she agreed. Will proceed in 3 months  -F/u with me in 6 months    2. Genetic Testing  -She notes a niece had breast cancer in her late 57s. Her mother and brother both had colon cancer.  -She is interested in genetic testing. I will refer her if eligible.    3. Smoking Cessation  -She has a long h/o smoking. She has been trying to quit but stopped using nicotine patch.  -I encouraged her to start weaning herself off the amount she smokes until she completely quits. She is agreeable.  -She has mild Wheezing on exam today, consistent with her COPD (05/13/19)  4. Bone Health  -I discussed AI such as Anastrozole can weaken her bone. Will monitor with DEXA scan every 2 years.  -Her last DEXA was in 2015, will obtain new baseline in 1 month   PLAN:  -I called in Anastrozole to start this month  -Virtual Survivorship with NP Lacie in 3 months -Lab and F/u in 6 months    No problem-specific Assessment & Plan notes found for this encounter.   Orders Placed This Encounter   Procedures  . DG Bone Density    Standing Status:   Future    Standing Expiration Date:   09/09/2020    Order Specific Question:   Reason for Exam (SYMPTOM  OR DIAGNOSIS REQUIRED)    Answer:   screening    Order Specific Question:   Preferred imaging location?  Answer:   [facility]   All questions were answered. The patient knows to call the clinic with any problems, questions or concerns. No barriers to learning was detected. The total time spent in the appointment was 30 minutes.     Truitt Merle, MD 09/10/2019   I, Joslyn Devon, am acting as scribe for Truitt Merle, MD.   I have reviewed the above documentation for accuracy and completeness, and I agree with the above.

## 2019-09-10 ENCOUNTER — Other Ambulatory Visit: Payer: Self-pay

## 2019-09-10 ENCOUNTER — Encounter: Payer: Self-pay | Admitting: Hematology

## 2019-09-10 ENCOUNTER — Inpatient Hospital Stay: Payer: Medicare Other | Attending: Hematology | Admitting: Hematology

## 2019-09-10 VITALS — BP 156/77 | HR 86 | Temp 98.2°F | Resp 20 | Ht 61.5 in | Wt 144.6 lb

## 2019-09-10 DIAGNOSIS — L819 Disorder of pigmentation, unspecified: Secondary | ICD-10-CM | POA: Diagnosis not present

## 2019-09-10 DIAGNOSIS — Z923 Personal history of irradiation: Secondary | ICD-10-CM | POA: Insufficient documentation

## 2019-09-10 DIAGNOSIS — C50412 Malignant neoplasm of upper-outer quadrant of left female breast: Secondary | ICD-10-CM

## 2019-09-10 DIAGNOSIS — Z79899 Other long term (current) drug therapy: Secondary | ICD-10-CM | POA: Diagnosis not present

## 2019-09-10 DIAGNOSIS — R062 Wheezing: Secondary | ICD-10-CM | POA: Diagnosis not present

## 2019-09-10 DIAGNOSIS — Z170 Estrogen receptor positive status [ER+]: Secondary | ICD-10-CM | POA: Insufficient documentation

## 2019-09-10 DIAGNOSIS — Z79811 Long term (current) use of aromatase inhibitors: Secondary | ICD-10-CM | POA: Diagnosis not present

## 2019-09-10 DIAGNOSIS — Z800 Family history of malignant neoplasm of digestive organs: Secondary | ICD-10-CM | POA: Diagnosis not present

## 2019-09-10 DIAGNOSIS — Z85828 Personal history of other malignant neoplasm of skin: Secondary | ICD-10-CM | POA: Insufficient documentation

## 2019-09-10 DIAGNOSIS — Z8719 Personal history of other diseases of the digestive system: Secondary | ICD-10-CM | POA: Diagnosis not present

## 2019-09-10 DIAGNOSIS — E2839 Other primary ovarian failure: Secondary | ICD-10-CM | POA: Diagnosis not present

## 2019-09-10 MED ORDER — ANASTROZOLE 1 MG PO TABS: 1.0000 mg | ORAL_TABLET | Freq: Every day | ORAL | 3 refills | Status: DC

## 2019-09-13 ENCOUNTER — Telehealth: Payer: Self-pay | Admitting: Hematology

## 2019-09-13 NOTE — Telephone Encounter (Signed)
Scheduled appt per 1/8 los.  Sent a message to HIM pool to get a calendar mailed out. 

## 2019-09-14 ENCOUNTER — Ambulatory Visit: Payer: Medicare Other

## 2019-09-14 ENCOUNTER — Encounter: Payer: Self-pay | Admitting: Orthopaedic Surgery

## 2019-09-14 ENCOUNTER — Ambulatory Visit (INDEPENDENT_AMBULATORY_CARE_PROVIDER_SITE_OTHER): Payer: Medicare Other | Admitting: Orthopaedic Surgery

## 2019-09-14 ENCOUNTER — Other Ambulatory Visit: Payer: Self-pay

## 2019-09-14 VITALS — BP 149/95 | HR 89 | Temp 98.0°F | Ht 62.0 in | Wt 145.0 lb

## 2019-09-14 DIAGNOSIS — G8929 Other chronic pain: Secondary | ICD-10-CM | POA: Diagnosis not present

## 2019-09-14 DIAGNOSIS — M25562 Pain in left knee: Secondary | ICD-10-CM

## 2019-09-14 NOTE — Progress Notes (Signed)
Subjective:    Patient ID: Courtney Dorsey, female    DOB: [birth year], 74 y.o.   MRN: [id number]  HPI She has had pain in the left knee for a while but getting worse over the last three weeks. She denies any trauma. She has swelling and popping but no redness or giving way.  She has tried ice and heat with no help.  She has no other joint pain.     Review of Systems  Constitutional: Positive for activity change.  Musculoskeletal: Positive for gait problem and joint swelling.  All other systems reviewed and are negative.  For Review of Systems, all other systems reviewed and are negative.  The following is a summary of the past history medically, past history surgically, known current medicines, social history and family history.  This information is gathered electronically by the computer from prior information and documentation.  I review this each visit and have found including this information at this point in the chart is beneficial and informative.   Past Medical History:  Diagnosis Date  . Anemia   . Diabetes mellitus without complication ([place])    "BORDERLINE", off all medication  . Diverticulosis   . Heart murmur    childhood   . History of hiatal hernia   . History of skin cancer   . History of transfusion   . Hypercholesteremia   . Hypertension   . Skin cancer   . UTI (lower urinary tract infection)    TAKING ANTIBIOTICS    Past Surgical History:  Procedure Laterality Date  . BREAST LUMPECTOMY WITH RADIOACTIVE SEED AND AXILLARY LYMPH NODE DISSECTION Left 06/17/2019   Procedure: LEFT BREAST LUMPECTOMY X2  WITH RADIOACTIVE SEED X2 AND LEFT SEED TARGETED AXILLARY LYMPH NODE EXCISION AND SENTINEL LYMPH NODE EXCISION;  Surgeon: Alphonsa Overall, MD;  Location: [place];  Service: General;  Laterality: Left;  . CHOLECYSTECTOMY    . COLONOSCOPY  2009   Dr. Aviva Signs  . COLONOSCOPY N/A 01/01/2013   [id number] mucosa in the terminal ileum  Mild  diverticulosis in the descending colon and sigmoid colon and sigmoid colon/Moderate sized internal hemorrhoids  . ESOPHAGOGASTRODUODENOSCOPY N/A 01/01/2013   [id number] hiatal hernia/ MODERATE SIZE PARA-ESOPHAGEAL HERNIA/MILD Erosive gastritis. chronic duodenitis c/w peptic duodenitis. no h.pylori or villous atrophy. minimal chronic gastritis.   Marland Kitchen ESOPHAGOGASTRODUODENOSCOPY N/A 02/16/2013   Procedure: ESOPHAGOGASTRODUODENOSCOPY (EGD);  Surgeon: Daneil Dolin, MD;  [facility];  Service: Endoscopy;  Laterality: N/A;  8:30  . GIVENS CAPSULE STUDY N/A 02/16/2013   EGD with capsule placement. no evidence of paraesophageal hernia. SB essentially unremarkable.  Marland Kitchen HIATAL HERNIA REPAIR N/A 03/21/2017   Procedure: LAPAROSCOPIC lysis of adhesions,reduction hiatal hernia, upper endoscopy,gastropexy;  Surgeon: Johnathan Hausen, MD;  [facility];  Service: General;  Laterality: N/A;  . KNOT REMOVED FROM SCALP    . LAPAROSCOPIC NISSEN FUNDOPLICATION N/A [id number]   Procedure: LAPAROSCOPIC NISSEN FUNDOPLICATION AND HIATAL HERNIA REPAIR;  Surgeon: Johnathan Hausen, MD;  [facility];  Service: General;  Laterality: N/A;    Current Outpatient Medications on File Prior to Visit  Medication Sig Dispense Refill  . acetaminophen (TYLENOL) 500 MG tablet Take 1,000 mg by mouth daily as needed for headache.    . anastrozole (ARIMIDEX) 1 MG tablet Take 1 tablet (1 mg total) by mouth daily. 30 tablet 3  . aspirin 81 MG tablet Take 81 mg by mouth every morning.     Marland Kitchen atorvastatin (LIPITOR) 80 MG tablet  Take 40 mg by mouth every evening.    . cimetidine (TAGAMET) 200 MG tablet Take 200 mg by mouth 2 (two) times daily.    . enalapril (VASOTEC) 10 MG tablet Take 10 mg by mouth daily.     Marland Kitchen HYDROcodone-acetaminophen (NORCO/VICODIN) 5-325 MG tablet Take 1 tablet by mouth every 6 (six) hours as needed for moderate pain. 15 tablet 0  . ondansetron (ZOFRAN-ODT) 4 MG disintegrating tablet Take 1 tablet (4 mg total)  by mouth every 6 (six) hours as needed for nausea. 20 tablet 0  . Simethicone (GAS-X PO) Take 1-2 tablets by mouth 2 (two) times daily as needed (stomach irritation).     No current facility-administered medications on file prior to visit.    Social History   Socioeconomic History  . Marital status: Married    Spouse name: Not on file  . Number of children: 1  . Years of education: Not on file  . Highest education level: Not on file  Occupational History    Employer: RETIRED  Tobacco Use  . Smoking status: Current Every Day Smoker    Packs/day: 1.00    Years: 28.00    Pack years: 28.00    Types: Cigarettes  . Smokeless tobacco: Never Used  . Tobacco comment: trying to quit  Substance and Sexual Activity  . Alcohol use: No  . Drug use: No  . Sexual activity: Yes    Birth control/protection: Post-menopausal  Other Topics Concern  . Not on file  Social History Narrative   Pt live with spouse. Retired.  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    4 cups caffeine daily.     Social Determinants of Health   Financial Resource Strain:   . Difficulty of Paying Living Expenses: Not on file  Food Insecurity:   . Worried About Charity fundraiser in the Last Year: Not on file  . Ran Out of Food in the Last Year: Not on file  Transportation Needs: No Transportation Needs  . Lack of Transportation (Medical): No  . Lack of Transportation (Non-Medical): No  Physical Activity:   . Days of Exercise per Week: Not on file  . Minutes of Exercise per Session: Not on file  Stress:   . Feeling of Stress : Not on file  Social Connections:   . Frequency of Communication with Friends and Family: Not on file  . Frequency of Social Gatherings with Friends and Family: Not on file  . Attends Religious Services: Not on file  . Active Member of Clubs or Organizations: Not on file  . Attends Archivist Meetings: Not on file  . Marital Status: Not on file  Intimate Partner Violence: Not At Risk  .  Fear of Current or Ex-Partner: No  . Emotionally Abused: No  . Physically Abused: No  . Sexually Abused: No    Family History  Problem Relation Age of Onset  . Colon cancer Mother        diagnosed around age 14  . Colon cancer Brother        age 45  . Diabetes Sister   . Diabetes Brother   . Cancer Niece 21       breast cancer   . Liver disease Neg Hx   . Lung cancer Neg Hx   . Breast cancer Neg Hx   . Ovarian cancer Neg Hx   . Celiac disease Neg Hx     BP (!) 149/95   Pulse 89  Temp 98 F (36.7 C)   Ht 5\' 2"  (1.575 m)   Wt 145 lb (65.8 kg)   BMI 26.52 kg/m   Body mass index is 26.52 kg/m.     Objective:   Physical Exam Vitals and nursing note reviewed.  Constitutional:      Appearance: She is well-developed.  HENT:     Head: Normocephalic and atraumatic.  Eyes:     Conjunctiva/sclera: Conjunctivae normal.     Pupils: Pupils are equal, round, and reactive to light.  Cardiovascular:     Rate and Rhythm: Normal rate and regular rhythm.  Pulmonary:     Effort: Pulmonary effort is normal.  Abdominal:     Palpations: Abdomen is soft.  Musculoskeletal:     Cervical back: Normal range of motion and neck supple.       Legs:  Skin:    General: Skin is warm and dry.  Neurological:     Mental Status: She is alert and oriented to person, place, and time.     Cranial Nerves: No cranial nerve deficit.     Motor: No abnormal muscle tone.     Coordination: Coordination normal.     Deep Tendon Reflexes: Reflexes are normal and symmetric. Reflexes normal.  Psychiatric:        Behavior: Behavior normal.        Thought Content: Thought content normal.        Judgment: Judgment normal.      X-rays were done of the left knee, reported separately.     Assessment & Plan:   Encounter Diagnosis  Name Primary?  . Chronic pain of right knee Yes   PROCEDURE NOTE:  The patient requests injections of the left knee , verbal consent was obtained.  The left knee  was prepped appropriately after time out was performed.   Sterile technique was observed and injection of 1 cc of Depo-Medrol 40 mg with several cc's of plain xylocaine. Anesthesia was provided by ethyl chloride and a 20-gauge needle was used to inject the knee area. The injection was tolerated well.  A band aid dressing was applied.  The patient was advised to apply ice later today and tomorrow to the injection sight as needed.  I have told her to take Aleve one bid pc.  Return in two  Weeks.  Call if any problem.  Precautions discussed.   Electronically Signed Sanjuana Kava, MD 1/12/20213:44 PM

## 2019-09-28 ENCOUNTER — Ambulatory Visit (INDEPENDENT_AMBULATORY_CARE_PROVIDER_SITE_OTHER): Payer: Medicare Other | Admitting: Orthopaedic Surgery

## 2019-09-28 ENCOUNTER — Other Ambulatory Visit: Payer: Self-pay

## 2019-09-28 ENCOUNTER — Encounter: Payer: Self-pay | Admitting: Orthopaedic Surgery

## 2019-09-28 VITALS — Temp 97.2°F | Ht 62.0 in | Wt 143.0 lb

## 2019-09-28 DIAGNOSIS — M25561 Pain in right knee: Secondary | ICD-10-CM

## 2019-09-28 DIAGNOSIS — F17210 Nicotine dependence, cigarettes, uncomplicated: Secondary | ICD-10-CM | POA: Diagnosis not present

## 2019-09-28 DIAGNOSIS — G8929 Other chronic pain: Secondary | ICD-10-CM | POA: Diagnosis not present

## 2019-09-28 NOTE — Progress Notes (Signed)
  Patient Name: Courtney Dorsey MRN: [id number] DOB: February 04, 1946 Referring Physician: Lucia Gaskins DAVID (Profile Not Attached) Date of Service: 09/02/2019 [facility], [place]                                                        End Of Treatment Note  Diagnoses: C50.412-Malignant neoplasm of upper-outer quadrant of left female breast  Cancer Staging: Cancer Staging Carcinoma of upper-outer quadrant of left breast in female, estrogen receptor positive ([place]) Staging form: Breast, AJCC 8th Edition - Clinical stage from 05/05/2019: Stage IB (cT1c, cN1, cM0, G2, ER+, PR+, HER2-) - Signed by Eppie Gibson, MD on 05/05/2019 - Pathologic stage from 06/17/2019: Stage IA (pT1b, pN1a, cM0, G2, ER+, PR+, HER2-) - Signed by Truitt Merle, MD on 09/10/2019   Intent: Curative  Radiation Treatment Dates: 07/21/2019 through 09/02/2019 Site Technique Total Dose (Gy) Dose per Fx (Gy) Completed Fx Beam Energies  Breast, Left: Breast_Lt 3D 50/50 2 25/25 10X, 15X  Breast, Left: Breast_Lt_SCV_PAB 3D 50/50 2 25/25 6X, 10X  Breast, Left: Breast_Lt_Bst 3D 10/10 2 5/5 6X, 10X   Narrative: The patient tolerated radiation therapy relatively well.   Plan: The patient will follow-up with radiation oncology in 43mo or as needed.  -----------------------------------  SEppie Gibson MD \

## 2019-09-28 NOTE — Progress Notes (Signed)
Patient IC:[id number] Courtney Dorsey, female DOB:[birth year], 74 y.o. [id number]  Chief Complaint  Patient presents with  . Knee Pain    Lt knee    HPI  Courtney Dorsey is a 74 y.o. female who has chronic left knee pain.  She is better after the injection. She has less swelling. She still has pain and popping but not as much.  The cold weather makes her worse. She has no new trauma.   Body mass index is 26.16 kg/m.  ROS  Review of Systems  Constitutional: Positive for activity change.  Musculoskeletal: Positive for gait problem and joint swelling.  All other systems reviewed and are negative.   All other systems reviewed and are negative.  The following is a summary of the past history medically, past history surgically, known current medicines, social history and family history.  This information is gathered electronically by the computer from prior information and documentation.  I review this each visit and have found including this information at this point in the chart is beneficial and informative.    Past Medical History:  Diagnosis Date  . Anemia   . Diabetes mellitus without complication ([place])    "BORDERLINE", off all medication  . Diverticulosis   . Heart murmur    childhood   . History of hiatal hernia   . History of skin cancer   . History of transfusion   . Hypercholesteremia   . Hypertension   . Skin cancer   . UTI (lower urinary tract infection)    TAKING ANTIBIOTICS    Past Surgical History:  Procedure Laterality Date  . BREAST LUMPECTOMY WITH RADIOACTIVE SEED AND AXILLARY LYMPH NODE DISSECTION Left 06/17/2019   Procedure: LEFT BREAST LUMPECTOMY X2  WITH RADIOACTIVE SEED X2 AND LEFT SEED TARGETED AXILLARY LYMPH NODE EXCISION AND SENTINEL LYMPH NODE EXCISION;  Surgeon: Alphonsa Overall, MD;  Location: [facility];  Service: General;  Laterality: Left;  . CHOLECYSTECTOMY    . COLONOSCOPY  2009   Dr. Aviva Signs  . COLONOSCOPY N/A 01/01/2013   [id number] mucosa in the terminal ileum  Mild diverticulosis in the descending colon and sigmoid colon and sigmoid colon/Moderate sized internal hemorrhoids  . ESOPHAGOGASTRODUODENOSCOPY N/A 01/01/2013   [id number] hiatal hernia/ MODERATE SIZE PARA-ESOPHAGEAL HERNIA/MILD Erosive gastritis. chronic duodenitis c/w peptic duodenitis. no h.pylori or villous atrophy. minimal chronic gastritis.   Marland Kitchen ESOPHAGOGASTRODUODENOSCOPY N/A 02/16/2013   Procedure: ESOPHAGOGASTRODUODENOSCOPY (EGD);  Surgeon: Daneil Dolin, MD;  Location: [facility];  Service: Endoscopy;  Laterality: N/A;  8:30  . GIVENS CAPSULE STUDY N/A 02/16/2013   EGD with capsule placement. no evidence of paraesophageal hernia. SB essentially unremarkable.  Marland Kitchen HIATAL HERNIA REPAIR N/A 03/21/2017   Procedure: LAPAROSCOPIC lysis of adhesions,reduction hiatal hernia, upper endoscopy,gastropexy;  Surgeon: Johnathan Hausen, MD;  Location: [facility];  Service: General;  Laterality: N/A;  . KNOT REMOVED FROM SCALP    . LAPAROSCOPIC NISSEN FUNDOPLICATION N/A [id number]   Procedure: LAPAROSCOPIC NISSEN FUNDOPLICATION AND HIATAL HERNIA REPAIR;  Surgeon: Johnathan Hausen, MD;  Location: [facility];  Service: General;  Laterality: N/A;    Family History  Problem Relation Age of Onset  . Colon cancer Mother        diagnosed around age 50  . Colon cancer Brother        age 23  . Diabetes Sister   . Diabetes Brother   . Cancer Niece 13       breast cancer   . Liver disease Neg Hx   .  Lung cancer Neg Hx   . Breast cancer Neg Hx   . Ovarian cancer Neg Hx   . Celiac disease Neg Hx     Social History Social History   Tobacco Use  . Smoking status: Current Every Day Smoker    Packs/day: 1.00    Years: 28.00    Pack years: 28.00    Types: Cigarettes  . Smokeless tobacco: Never Used  . Tobacco comment: trying to quit  Substance Use Topics  . Alcohol use: No  . Drug use: No    No Known Allergies  Current Outpatient Medications  Medication Sig Dispense  Refill  . acetaminophen (TYLENOL) 500 MG tablet Take 1,000 mg by mouth daily as needed for headache.    . anastrozole (ARIMIDEX) 1 MG tablet Take 1 tablet (1 mg total) by mouth daily. 30 tablet 3  . aspirin 81 MG tablet Take 81 mg by mouth every morning.     Marland Kitchen atorvastatin (LIPITOR) 80 MG tablet Take 40 mg by mouth every evening.    . cimetidine (TAGAMET) 200 MG tablet Take 200 mg by mouth 2 (two) times daily.    . enalapril (VASOTEC) 10 MG tablet Take 10 mg by mouth daily.     Marland Kitchen HYDROcodone-acetaminophen (NORCO/VICODIN) 5-325 MG tablet Take 1 tablet by mouth every 6 (six) hours as needed for moderate pain. 15 tablet 0  . ondansetron (ZOFRAN-ODT) 4 MG disintegrating tablet Take 1 tablet (4 mg total) by mouth every 6 (six) hours as needed for nausea. 20 tablet 0  . Simethicone (GAS-X PO) Take 1-2 tablets by mouth 2 (two) times daily as needed (stomach irritation).     No current facility-administered medications for this visit.     Physical Exam  Temperature (!) 97.2 F (36.2 C), height 5\' 2"  (1.575 m), weight 143 lb (64.9 kg).  Constitutional: overall normal hygiene, normal nutrition, well developed, normal grooming, normal body habitus. Assistive device:none  Musculoskeletal: gait and station Limp left slightly, muscle tone and strength are normal, no tremors or atrophy is present.  .  Neurological: coordination overall normal.  Deep tendon reflex/nerve stretch intact.  Sensation normal.  Cranial nerves II-XII intact.   Skin:   Normal overall no scars, lesions, ulcers or rashes. No psoriasis.  Psychiatric: Alert and oriented x 3.  Recent memory intact, remote memory unclear.  Normal mood and affect. Well groomed.  Good eye contact.  Cardiovascular: overall no swelling, no varicosities, no edema bilaterally, normal temperatures of the legs and arms, no clubbing, cyanosis and good capillary refill.  Lymphatic: palpation is normal.  Left knee has crepitus and slight effusion, ROM 0  to 110, slight limp left, NV intact.  Knee stable.  All other systems reviewed and are negative   The patient has been educated about the nature of the problem(s) and counseled on treatment options.  The patient appeared to understand what I have discussed and is in agreement with it.  Encounter Diagnoses  Name Primary?  . Chronic pain of right knee Yes  . Nicotine dependence, cigarettes, uncomplicated     PLAN Call if any problems.  Precautions discussed.  Continue current medications.   Return to clinic 1 month   Electronically Signed Sanjuana Kava, MD 1/26/20219:28 AM

## 2019-09-29 ENCOUNTER — Telehealth: Payer: Self-pay

## 2019-09-29 NOTE — Telephone Encounter (Signed)
Courtney Dorsey called and stated she has not heard anything about her Bone Density scan to be done at [facility].  I made an appointment for 2/4 at 12:30pm.  I provided patient instructions. She verbalized understanding

## 2019-09-30 ENCOUNTER — Encounter: Payer: Self-pay | Admitting: Radiation Oncology

## 2019-09-30 NOTE — Progress Notes (Signed)
I called the patient today about her upcoming follow-up appointment in radiation oncology.   Given the state of the COVID-19 pandemic, concerning case numbers in our community, and guidance from [facility], I offered a phone assessment with the patient to determine if coming to the clinic was necessary. She accepted.  I let the patient know that I had spoken with Dr. Isidore Moos, and she wanted them to know the importance of washing their hands for at least 20 seconds at a time, especially after going out in public, and before they eat.  Limit going out in public whenever possible. Do not touch your face, unless your hands are clean, such as when bathing. Get plenty of rest, eat well, and stay hydrated.   The patient denies any symptomatic concerns.  Specifically, they report good healing of their skin in the radiation fields.  Skin is intact.    I recommended that she continue skin care by applying oil or lotion with vitamin E to the skin in the radiation fields, BID, for 2 more months.  Continue follow-up with medical oncology - follow-up is scheduled on 12/09/19 with Cira Rue NP.  I explained that yearly mammograms are important for patients with intact breast tissue, and physical exams are important after mastectomy for patients that cannot undergo mammography.  I encouraged her to call if she had further questions or concerns about her healing. Otherwise, she will follow-up PRN in radiation oncology. Patient is pleased with this plan, and we will cancel her upcoming follow-up to reduce the risk of COVID-19 transmission.

## 2019-10-01 ENCOUNTER — Ambulatory Visit
Admission: RE | Admit: 2019-10-01 | Discharge: 2019-10-01 | Disposition: A | Discharge status: Home / Self Care | Payer: Medicare Other | Source: Ambulatory Visit | Attending: Radiation Oncology | Admitting: Radiation Oncology

## 2019-10-01 HISTORY — DX: Personal history of irradiation: Z92.3

## 2019-10-07 ENCOUNTER — Ambulatory Visit (HOSPITAL_COMMUNITY)
Admission: RE | Admit: 2019-10-07 | Discharge: 2019-10-07 | Disposition: A | Discharge status: Home / Self Care | Payer: Medicare Other | Source: Ambulatory Visit | Attending: Hematology | Admitting: Hematology

## 2019-10-07 ENCOUNTER — Other Ambulatory Visit: Payer: Self-pay

## 2019-10-07 DIAGNOSIS — E785 Hyperlipidemia, unspecified: Secondary | ICD-10-CM | POA: Diagnosis not present

## 2019-10-07 DIAGNOSIS — Z780 Asymptomatic menopausal state: Secondary | ICD-10-CM | POA: Diagnosis not present

## 2019-10-07 DIAGNOSIS — E119 Type 2 diabetes mellitus without complications: Secondary | ICD-10-CM | POA: Diagnosis not present

## 2019-10-07 DIAGNOSIS — R011 Cardiac murmur, unspecified: Secondary | ICD-10-CM | POA: Diagnosis not present

## 2019-10-07 DIAGNOSIS — E2839 Other primary ovarian failure: Secondary | ICD-10-CM | POA: Insufficient documentation

## 2019-10-07 DIAGNOSIS — I10 Essential (primary) hypertension: Secondary | ICD-10-CM | POA: Diagnosis not present

## 2019-10-07 DIAGNOSIS — Z0000 Encounter for general adult medical examination without abnormal findings: Secondary | ICD-10-CM | POA: Diagnosis not present

## 2019-10-07 DIAGNOSIS — Z1389 Encounter for screening for other disorder: Secondary | ICD-10-CM | POA: Diagnosis not present

## 2019-10-07 DIAGNOSIS — Z13820 Encounter for screening for osteoporosis: Secondary | ICD-10-CM | POA: Diagnosis not present

## 2019-10-07 DIAGNOSIS — Z6827 Body mass index (BMI) 27.0-27.9, adult: Secondary | ICD-10-CM | POA: Diagnosis not present

## 2019-10-07 DIAGNOSIS — D509 Iron deficiency anemia, unspecified: Secondary | ICD-10-CM | POA: Diagnosis not present

## 2019-10-09 ENCOUNTER — Encounter: Payer: Self-pay | Admitting: Hematology

## 2019-10-28 ENCOUNTER — Ambulatory Visit (INDEPENDENT_AMBULATORY_CARE_PROVIDER_SITE_OTHER): Payer: Medicare Other | Admitting: Orthopaedic Surgery

## 2019-10-28 ENCOUNTER — Other Ambulatory Visit: Payer: Self-pay

## 2019-10-28 ENCOUNTER — Encounter: Payer: Self-pay | Admitting: Orthopaedic Surgery

## 2019-10-28 VITALS — Ht 62.0 in | Wt 143.0 lb

## 2019-10-28 DIAGNOSIS — M25561 Pain in right knee: Secondary | ICD-10-CM

## 2019-10-28 DIAGNOSIS — G8929 Other chronic pain: Secondary | ICD-10-CM

## 2019-10-28 DIAGNOSIS — F17210 Nicotine dependence, cigarettes, uncomplicated: Secondary | ICD-10-CM | POA: Diagnosis not present

## 2019-10-28 NOTE — Progress Notes (Signed)
I am OK today  She is much improved with the left knee pain.  She has some swelling but does not have the pain she had.  She has no giving way.  ROM is 0 to 115, crepitus and no effusion today.  Gait is normal.  Encounter Diagnoses  Name Primary?  . Chronic pain of right knee Yes  . Nicotine dependence, cigarettes, uncomplicated    I will see as needed.  Call if any problem.  Precautions discussed.   Electronically Signed Sanjuana Kava, MD 2/25/20219:12 AM

## 2019-10-31 ENCOUNTER — Ambulatory Visit: Payer: Medicare Other | Attending: Internal Medicine

## 2019-10-31 DIAGNOSIS — Z23 Encounter for immunization: Secondary | ICD-10-CM | POA: Insufficient documentation

## 2019-10-31 NOTE — Progress Notes (Signed)
   Covid-19 Vaccination Clinic  Name:  Courtney Dorsey    MRN: [id number] DOB: 05-18-1946  10/31/2019  Courtney Dorsey was observed post Covid-19 immunization for 15 minutes without incidence. She was provided with Vaccine Information Sheet and instruction to access the V-Safe system.   Courtney Dorsey was instructed to call 911 with any severe reactions post vaccine: Marland Kitchen Difficulty breathing  . Swelling of your face and throat  . A fast heartbeat  . A bad rash all over your body  . Dizziness and weakness    Immunizations Administered    Name Date Dose VIS Date Route   Pfizer COVID-19 Vaccine 10/31/2019 12:51 PM 0.3 mL 08/13/2019 Intramuscular   Manufacturer: [place]   Lot: [id number]   [place]: [id number]

## 2019-11-03 ENCOUNTER — Encounter: Payer: Self-pay | Admitting: Hematology

## 2019-11-30 ENCOUNTER — Ambulatory Visit: Payer: Medicare Other | Attending: Internal Medicine

## 2019-11-30 DIAGNOSIS — Z23 Encounter for immunization: Secondary | ICD-10-CM

## 2019-11-30 NOTE — Progress Notes (Signed)
   Covid-19 Vaccination Clinic  Name:  Courtney Dorsey    MRN: [id number] DOB: 04/25/1946  11/30/2019  [name] was observed post Covid-19 immunization for 15 minutes without incident. She was provided with Vaccine Information Sheet and instruction to access the V-Safe system.   [name] was instructed to call 911 with any severe reactions post vaccine: Marland Kitchen Difficulty breathing  . Swelling of face and throat  . A fast heartbeat  . A bad rash all over body  . Dizziness and weakness   Immunizations Administered    Name Date Dose VIS Date Route   Pfizer COVID-19 Vaccine 11/30/2019 10:37 AM 0.3 mL 08/13/2019 Intramuscular   Manufacturer: [place]   Lot: [id number]   [place]: [id number]

## 2019-12-09 ENCOUNTER — Inpatient Hospital Stay: Payer: Medicare Other | Attending: Hematology | Admitting: Nurse Practitioner

## 2019-12-09 ENCOUNTER — Encounter: Payer: Self-pay | Admitting: Nurse Practitioner

## 2019-12-09 DIAGNOSIS — Z170 Estrogen receptor positive status [ER+]: Secondary | ICD-10-CM

## 2019-12-09 DIAGNOSIS — C50412 Malignant neoplasm of upper-outer quadrant of left female breast: Secondary | ICD-10-CM

## 2019-12-09 NOTE — Progress Notes (Signed)
Patient Care Team: Courtney Sites, MD as PCP - General (Family Medicine) Courtney Romney Cristopher Estimable, MD as Consulting Physician (Gastroenterology) Courtney Overall, MD as Consulting Physician (General Surgery) Courtney Gibson, MD as Attending Physician (Radiation Oncology) Courtney Merle, MD as Consulting Physician (Hematology) Courtney Kaufmann, RN as Oncology Nurse Navigator Courtney Germany, RN as Oncology Nurse Navigator Courtney Feeling, NP as Nurse Practitioner (Nurse Practitioner)   Clinic: Survivorship   I connected with Courtney Dorsey on 12/09/19 at  9:45 AM EDT by MyChart video and verified that I am speaking with the correct person using two identifiers. However, the video enabling did not work and I could not see her face, although she could see mine.  I discussed the limitations, risks, security and privacy concerns of performing an evaluation and management service by telephone and the availability of in person appointments. I also discussed with the patient that there may be a patient responsible charge related to this service. The patient expressed understanding and agreed to proceed.   Patient location: Home Provider location: [facility] office  BRIEF ONCOLOGIC HISTORY:  Oncology History Overview Note  Cancer Staging Carcinoma of upper-outer quadrant of left breast in female, estrogen receptor positive ([place]) Staging form: Breast, AJCC 8th Edition - Clinical stage from 05/05/2019: Stage IB (cT1c, cN1, cM0, G2, ER+, PR+, HER2-) - Signed by Courtney Gibson, MD on 05/05/2019 - Pathologic stage from 06/17/2019: Stage IA (pT1b, pN1a, cM0, G2, ER+, PR+, HER2-) - Signed by Courtney Merle, MD on 09/10/2019    Carcinoma of upper-outer quadrant of left breast in female, estrogen receptor positive ([place])  04/06/2019 Mammogram   Diagnostic mammogram 04/06/19  IMPRESSION: 1. 2 x 1.6 x 1.9cm irregular breast mass with associated architectural distortion in the 2 o'clock position of the left breast, 6 cm from the  nipple corresponding to the palpable abnormality. This is highly suspicious for breast malignancy. 2. Small probably benign mass with central calcifications in the 7 o'clock position of the breast, measuring 7x4x7 mm. This likely corresponds to a focal opacity with calcifications seen mammographically that has been stable. 3. Single abnormal left axillary lymph node with a cortical thickness of 6 mm.   04/13/2019 Initial Biopsy   Diagnosis 04/13/19 1. Breast, left, needle core biopsy, upper outer quadrant at 2:00 - INVASIVE MAMMARY CARCINOMA, GRADE II. 2. Lymph node, needle/core biopsy, left axilla - METASTATIC CARCINOMA.   04/13/2019 Receptors her2   Results: GROUP 5: HER2 **NEGATIVE** Estrogen Receptor: 100%, POSITIVE, STRONG STAINING INTENSITY Progesterone Receptor: 5%, POSITIVE, STRONG STAINING INTENSITY Proliferation Marker Ki67: 15%   05/05/2019 Initial Diagnosis   Carcinoma of upper-outer quadrant of left breast in female, estrogen receptor positive ([place])   05/05/2019 Cancer Staging   Staging form: Breast, AJCC 8th Edition - Clinical stage from 05/05/2019: Stage IB (cT1c, cN1, cM0, G2, ER+, PR+, HER2-) - Signed by Courtney Gibson, MD on 05/05/2019   05/18/2019 Pathology Results   FINAL MICROSCOPIC DIAGNOSIS: 05/18/19 A. BREAST, LEFT, BIOPSY:  - Invasive ductal carcinoma, grade 2.  See comment  - Ductal carcinoma in situ, intermediate grade    06/17/2019 Surgery   LEFT BREAST LUMPECTOMY X2 WITH RADIOACTIVE SEED X2 AND LEFT SEED TARGETED AXILLARY LYMPH NODE EXCISION AND SENTINEL LYMPH NODE EXCISION  by Dr Lucia Gaskins 06/17/19   06/17/2019 Pathology Results   FINAL MICROSCOPIC DIAGNOSIS: 06/17/19  A. BREAST, LEFT, 7 O'CLOCK, LUMPECTOMY:  - Invasive ductal carcinoma, 0.7 cm, Nottingham grade 2 of 3.  - Margins of resection are not involved (Closest margin: Less than  1 mm,  Superior/medial).  - See oncology table.   B. BREAST, LEFT, 2 O'CLOCK, LUMPECTOMY:  - Invasive ductal  carcinoma, 2.8 cm, Nottingham grade 2 of 3.  - Margins of resection are not involved (Closest margin: Less than 1 mm,  Posterior).  - Ductal carcinoma in situ.  - See oncology table.   C. BREAST, LEFT, ADDITIONAL INFERIOR MARGIN, 7 O'CLOCK, EXCISION:  - Breast tissue, negative for carcinoma.   D. SENTINEL LYMPH NODE, LEFT AXILLARY, BIOPSY:  - Metastatic carcinoma present in one of one lymph node (1/1).  - Biopsy site changes.   E. SENTINEL LYMPH NODE, LEFT AXILLARY, BIOPSY:  - Metastatic carcinoma present in one of one lymph node (1/1).   F. SENTINEL LYMPH NODE, LEFT AXILLARY, BIOPSY:  - One lymph node, negative for carcinoma (0/1).      06/17/2019 Miscellaneous   Mammaprint  Low Risk Luminal Type A with 10% risk of recurrence in 10 years.  Index +0.436 97.8% benefit of hormonal therapy.    06/17/2019 Cancer Staging   Staging form: Breast, AJCC 8th Edition - Pathologic stage from 06/17/2019: Stage IA (pT1b, pN1a, cM0, G2, ER+, PR+, HER2-) - Signed by Courtney Merle, MD on 09/10/2019   07/21/2019 - 09/02/2019 Radiation Therapy   Adjuvant radiation with Dr Isidore Moos 07/21/19-09/02/19   09/2019 -  Anti-estrogen oral therapy   Anastrozole 73m daily starting 09/2019    12/09/2019 Survivorship   Virtual - SCP delivered by LCira Rue NP      INTERVAL HISTORY:  Courtney Dorsey review her survivorship care plan detailing her treatment course for breast cancer, as well as monitoring long-term side effects of that treatment, education regarding health maintenance, screening, and Dorsey wellness and health promotion.     Ms. WColasantipresents virtually for today's visit. She feels well in general. She is noticeably more "edgy and emotional" since starting AI with mild worsening of arthritic pain in her knee. She limps occasionally. She had an injection per PCP. She feels better when up walking. For her mood, she takes anastrozole at night which helps. She prefers to monitor her symptoms for now.  Denies hot flashes. Otherwise, denies change in appetite or weight, bowel habits, respiratory function, or concerns in her breasts such as new lump/mass, nipple discharge or inversion. The left breast is more firm from radiation. She thinks she might have mild lymphedema. She will see Dr. NLucia Gaskinstomorrow. Denies other concerns.    ONCOLOGY TREATMENT TEAM:  1. Sur[facility]  [facility] 2. Medical Oncologist: Dr. FBurr Medico3. Radiation Oncologist: Dr. SIsidore Moos   PAST MEDICAL/SURGICAL HISTORY:  Past Medical History:  Diagnosis Date  . Anemia   . Diabetes mellitus without complication ([place]    "BORDERLINE", off all medication  . Diverticulosis   . Heart murmur    childhood   . History of hiatal hernia   . History of radiation therapy 07/21/19- 09/02/19   Left Breast 25 fx of 2 Gy each to total 50 Gy. Left Breast boost 5 fx of 2 Gy each to total 10 Gy  . History of skin cancer   . History of transfusion   . Hypercholesteremia   . Hypertension   . Skin cancer   . UTI (lower urinary tract infection)    TAKING ANTIBIOTICS   Past Surgical History:  Procedure Laterality Date  . BREAST LUMPECTOMY WITH RADIOACTIVE SEED AND AXILLARY LYMPH NODE DISSECTION Left 06/17/2019   Procedure: LEFT BREAST LUMPECTOMY X2  WITH RADIOACTIVE  SEED X2 AND LEFT SEED TARGETED AXILLARY LYMPH NODE EXCISION AND SENTINEL LYMPH NODE EXCISION;  Surgeon: Courtney Overall, MD;  Location: [place];  Service: General;  Laterality: Left;  . CHOLECYSTECTOMY    . COLONOSCOPY  2009   Dr. Aviva Signs  . COLONOSCOPY N/A 01/01/2013   NLG:XQJJHE mucosa in the terminal ileum  Mild diverticulosis in the descending colon and sigmoid colon and sigmoid colon/Moderate sized internal hemorrhoids  . ESOPHAGOGASTRODUODENOSCOPY N/A 01/01/2013   RDE:YCXKG hiatal hernia/ MODERATE SIZE PARA-ESOPHAGEAL HERNIA/MILD Erosive gastritis. chronic duodenitis c/w peptic duodenitis. no h.pylori or villous atrophy. minimal  chronic gastritis.   Marland Kitchen ESOPHAGOGASTRODUODENOSCOPY N/A 02/16/2013   Procedure: ESOPHAGOGASTRODUODENOSCOPY (EGD);  Surgeon: Daneil Dolin, MD;  [facility];  Service: Endoscopy;  Laterality: N/A;  8:30  . GIVENS CAPSULE STUDY N/A 02/16/2013   EGD with capsule placement. no evidence of paraesophageal hernia. SB essentially unremarkable.  Marland Kitchen HIATAL HERNIA REPAIR N/A 03/21/2017   Procedure: LAPAROSCOPIC lysis of adhesions,reduction hiatal hernia, upper endoscopy,gastropexy;  Surgeon: Johnathan Hausen, MD;  Location: [facility];  Service: General;  Laterality: N/A;  . KNOT REMOVED FROM SCALP    . LAPAROSCOPIC NISSEN FUNDOPLICATION N/A 04/02/8562   Procedure: LAPAROSCOPIC NISSEN FUNDOPLICATION AND HIATAL HERNIA REPAIR;  Surgeon: Johnathan Hausen, MD;  Location: [facility];  Service: General;  Laterality: N/A;     ALLERGIES:  No Known Allergies   CURRENT MEDICATIONS:  Outpatient Encounter Medications as of 12/09/2019  Medication Sig  . acetaminophen (TYLENOL) 500 MG tablet Take 1,000 mg by mouth daily as needed for headache.  . anastrozole (ARIMIDEX) 1 MG tablet Take 1 tablet (1 mg total) by mouth daily.  Marland Kitchen aspirin 81 MG tablet Take 81 mg by mouth every morning.   Marland Kitchen atorvastatin (LIPITOR) 80 MG tablet Take 40 mg by mouth every evening.  . cimetidine (TAGAMET) 200 MG tablet Take 200 mg by mouth 2 (two) times daily.  . enalapril (VASOTEC) 10 MG tablet Take 10 mg by mouth daily.   . Simethicone (GAS-X PO) Take 1-2 tablets by mouth 2 (two) times daily as needed (stomach irritation).  . ondansetron (ZOFRAN-ODT) 4 MG disintegrating tablet Take 1 tablet (4 mg total) by mouth every 6 (six) hours as needed for nausea.  . [DISCONTINUED] HYDROcodone-acetaminophen (NORCO/VICODIN) 5-325 MG tablet Take 1 tablet by mouth every 6 (six) hours as needed for moderate pain.   No facility-administered encounter medications on file as of 12/09/2019.     ONCOLOGIC FAMILY HISTORY:  Family History  Problem Relation Age  of Onset  . Colon cancer Mother        diagnosed around age 49  . Colon cancer Brother        age 35  . Diabetes Sister   . Diabetes Brother   . Cancer Niece 55       breast cancer   . Liver disease Neg Hx   . Lung cancer Neg Hx   . Breast cancer Neg Hx   . Ovarian cancer Neg Hx   . Celiac disease Neg Hx      GENETIC COUNSELING/TESTING: None  SOCIAL HISTORY:  Social History   Socioeconomic History  . Marital status: Married    Spouse name: Not on file  . Number of children: 1  . Years of education: Not on file  . Highest education level: Not on file  Occupational History    Employer: RETIRED  Tobacco Use  . Smoking status: Current Every Day Smoker    Packs/day:  1.00    Years: 28.00    Pack years: 28.00    Types: Cigarettes  . Smokeless tobacco: Never Used  . Tobacco comment: trying to quit  Substance and Sexual Activity  . Alcohol use: No  . Drug use: No  . Sexual activity: Yes    Birth control/protection: Post-menopausal  Other Topics Concern  . Not on file  Social History Narrative   Pt live with spouse. Retired.  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    4 cups caffeine daily.     Social Determinants of Health   Financial Resource Strain:   . Difficulty of Paying Living Expenses:   Food Insecurity:   . Worried About Charity fundraiser in the Last Year:   . Arboriculturist in the Last Year:   Transportation Needs: No Transportation Needs  . Lack of Transportation (Medical): No  . Lack of Transportation (Non-Medical): No  Physical Activity:   . Days of Exercise per Week:   . Minutes of Exercise per Session:   Stress:   . Dorsey of Stress :   Social Connections:   . Frequency of Communication with Friends and Family:   . Frequency of Social Gatherings with Friends and Family:   . Attends Religious Services:   . Active Member of Clubs or Organizations:   . Attends Archivist Meetings:   Marland Kitchen Marital Status:   Intimate Partner Violence: Not At Risk   . Fear of Current or Ex-Partner: No  . Emotionally Abused: No  . Physically Abused: No  . Sexually Abused: No     OBSERVATIONS/OBJECTIVE:   No physical or breast exam for this virtual visit. Patient appears well over the phone. Speech is clear and non-pressured. Mood and affect are pleasant. No cough or conversational dyspnea  LABORATORY DATA:  None for this visit.  DIAGNOSTIC IMAGING:  None for this visit.      ASSESSMENT AND PLAN:  Ms.. Dorsey is a pleasant 74 y.o. 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  She presents to the Survivorship Clinic for our initial meeting and routine follow-up post-completion of treatment for breast cancer.    1. Stage IA left breast cancer:  Courtney Dorsey appears to have recovered well from definitive treatment for breast cancer. She will follow-up with her medical oncologist, Dr. Burr Medico in 03/2020 with history and physical exam per surveillance protocol.  She will continue her anti-estrogen therapy with Anastrozole. Thus far, she is tolerating moderately well with mild increase in arthritic pain and mood lability. She prefers to monitor her symptoms for now. She was instructed to make Dr. Burr Medico or myself aware if she begins to experience any worsening side effects of the medication and I could see her back in clinic to help manage those side effects, as needed. Her mammogram is due 04/2020, orders placed today.  Her breast density is category C. Today, a comprehensive survivorship care plan and treatment summary was reviewed with the patient today detailing her breast cancer diagnosis, treatment course, potential late/long-term effects of treatment, appropriate follow-up care with recommendations for the future, and patient education resources.  A copy of this summary, along with a letter  will be sent to the patient's primary care provider via In Basket message after today's visit.    2. Mood lability and mild worsening of arthritic knee pain: secondary to AI.  She takes anastrozole at night which helps her mood. She remains active which helps her joint pain. She prefers to monitor her symptoms for now, does not want supportive medication and does not wish to change to alternative anti-estrogen therapy. Will monitor closely.   3. Bone health:  Given Courtney Dorsey's age/history of breast cancer and her current treatment regimen including anti-estrogen therapy with Anastrozole, she is at risk for bone demineralization.  Her last DEXA scan was 10/2019 which was normal. Will repeat in 10/2021.  In the meantime, she was encouraged to increase her consumption of foods rich in calcium, continue vitamin D, as well as increase her weight-bearing activities.  She was given education on specific activities to promote bone health.  4. Cancer screening:  Due to Courtney Dorsey's history and her age, she should receive screening for skin cancers, colon cancer, and gynecologic cancers. She was encouraged to use shared decision making regarding continuation of her colorectal and GYN screenings. The information and recommendations are listed on the patient's comprehensive care plan/treatment summary and were reviewed in detail with the patient.    5. Health maintenance and wellness promotion: Courtney Dorsey was encouraged to consume 5-7 servings of fruits and vegetables per day. We reviewed the "Nutrition Rainbow" handout, as well as the handout "Take Control of Your Health and Reduce Your Cancer Risk" from the [employer].  She was also encouraged to engage in moderate to vigorous exercise for 30 minutes per day most days of the week. We discussed the [employer] fitness program, which is designed for cancer survivors to help them become more physically fit after cancer treatments.  She was instructed to  limit her alcohol consumption and quit smoking. She has 30 pack year smoking history, she continues to smoke but trying to quit. She qualifies for lung cancer screening CT but is not interested in scheduling at this time. She will discus with PCP at a later date. She was encouraged to quit smoking completely.      6. Support services/counseling: It is not uncommon for this period of the patient's cancer care trajectory to be one of many emotions and stressors.  We discussed how this can be increasingly difficult during the times of quarantine and social distancing due to the COVID-19 pandemic.   She was given information regarding our available services and encouraged to contact me with any questions or for help enrolling in any of our support group/programs.    Follow up instructions:    -Return to cancer center 03/2020 -Mammogram due in 04/2020, ordered today -Follow up with surgery as scheduled on 12/10/2019 -She is welcome to return back to the Survivorship Clinic at any time; no additional follow-up needed at this time.  -Consider referral back to survivorship as a long-term survivor for continued surveillance The patient was provided an opportunity to ask questions and all were answered. The patient agreed with the plan and demonstrated an understanding of the instructions.   Orders Placed This Encounter  Procedures  . MM DIAG BREAST TOMO BILATERAL    Standing Status:   Future    Standing Expiration Date:   12/08/2020    Order Specific Question:   Reason for Exam (SYMPTOM  OR DIAGNOSIS REQUIRED)    Answer:   h/o left breast cancer s/p lumpectomy and radiation, on AI    Order Specific Question:   Preferred imaging location?    Answer:   [facility]   The patient was advised to call back or  seek an in-person evaluation if the symptoms worsen or if the condition fails to improve as anticipated.  I provided 30 minutes of non-face-to-face time during this encounter.   Courtney Feeling, NP

## 2019-12-10 DIAGNOSIS — C50912 Malignant neoplasm of unspecified site of left female breast: Secondary | ICD-10-CM | POA: Diagnosis not present

## 2019-12-10 DIAGNOSIS — F17200 Nicotine dependence, unspecified, uncomplicated: Secondary | ICD-10-CM | POA: Diagnosis not present

## 2019-12-11 ENCOUNTER — Other Ambulatory Visit: Payer: Self-pay | Admitting: Hematology

## 2019-12-14 ENCOUNTER — Encounter: Payer: Self-pay | Admitting: Nurse Practitioner

## 2020-02-04 NOTE — Progress Notes (Signed)
CARDIOLOGY CONSULT NOTE       Patient ID: Courtney Dorsey MRN: [id number] DOB/AGE: [birth year] 72 y.o.  Admit date: (Not on file) Referring Physician: Hilma Favors  Primary Physician: Sharilyn Sites, MD Primary Cardiologist: Deboraha Sprang  Reason for Consultation: F/U pre syncope   Active Problems:   * No active hospital problems. *   HPI:  74 y.o. last seen October 2018 for syncope No cardiac etiology noted. Thought to be vagla. 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 Anemia   . Diabetes mellitus without complication ([place])    "BORDERLINE", off all medication  . Diverticulosis   . Heart murmur    childhood   . History of hiatal hernia   . History of radiation therapy 07/21/19- 09/02/19   Left Breast 25 fx of 2 Gy each to total 50 Gy. Left Breast boost 5 fx of 2 Gy each to total 10 Gy  . History of skin cancer   . History of transfusion   . Hypercholesteremia   . Hypertension   . Skin cancer   . UTI (lower urinary tract infection)    TAKING ANTIBIOTICS    Family History  Problem Relation Age of Onset  . Colon cancer Mother        diagnosed around age 2  . Colon cancer Brother        age 52  . Diabetes Sister   . Diabetes Brother   . Cancer Niece 86       breast cancer   . Liver disease Neg Hx   . Lung cancer Neg Hx   . Breast cancer Neg Hx   . Ovarian cancer Neg Hx   . Celiac disease Neg  Hx     Social History   Socioeconomic History  . Marital status: Married    Spouse name: Not on file  . Number of children: 1  . Years of education: Not on file  . Highest education level: Not on file  Occupational History    Employer: RETIRED  Tobacco Use  . Smoking status: Current Every Day Smoker    Packs/day: 1.00    Years: 28.00    Pack years: 28.00    Types: Cigarettes  . Smokeless tobacco: Never Used  . Tobacco comment: trying to quit  Vaping Use  . Vaping Use: Never used  Substance and Sexual Activity  . Alcohol use: No  . Drug use: No  . Sexual activity: Yes    Birth control/protection: Post-menopausal  Other Topics Concern  . Not on file  Social History Narrative   Pt live with spouse. Retired.  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    4 cups caffeine daily.     Social Determinants of Health   Financial Resource Strain:   . Difficulty of Paying Living Expenses:   Food Insecurity:   . Worried About Running  Out of Food in the Last Year:   . [place] in the Last Year:   Transportation Needs: No Transportation Needs  . Lack of Transportation (Medical): No  . Lack of Transportation (Non-Medical): No  Physical Activity:   . Days of Exercise per Week:   . Minutes of Exercise per Session:   Stress:   . Feeling of Stress :   Social Connections:   . Frequency of Communication with Friends and Family:   . Frequency of Social Gatherings with Friends and Family:   . Attends Religious Services:   . Active Member of Clubs or Organizations:   . Attends Archivist Meetings:   Marland Kitchen Marital Status:   Intimate Partner Violence: Not At Risk  . Fear of Current or Ex-Partner: No  . Emotionally Abused: No  . Physically Abused: No  . Sexually Abused: No    Past Surgical History:  Procedure Laterality Date  . BREAST LUMPECTOMY WITH RADIOACTIVE SEED AND AXILLARY LYMPH NODE DISSECTION Left 06/17/2019   Procedure: LEFT BREAST LUMPECTOMY X2  WITH RADIOACTIVE SEED X2 AND  LEFT SEED TARGETED AXILLARY LYMPH NODE EXCISION AND SENTINEL LYMPH NODE EXCISION;  Surgeon: Alphonsa Overall, MD;  Location: [facility];  Service: General;  Laterality: Left;  . CHOLECYSTECTOMY    . COLONOSCOPY  2009   Dr. Aviva Signs  . COLONOSCOPY N/A 01/01/2013   XBM:WUXLKG mucosa in the terminal ileum  Mild diverticulosis in the descending colon and sigmoid colon and sigmoid colon/Moderate sized internal hemorrhoids  . ESOPHAGOGASTRODUODENOSCOPY N/A 01/01/2013   MWN:UUVOZ hiatal hernia/ MODERATE SIZE PARA-ESOPHAGEAL HERNIA/MILD Erosive gastritis. chronic duodenitis c/w peptic duodenitis. no h.pylori or villous atrophy. minimal chronic gastritis.   Marland Kitchen ESOPHAGOGASTRODUODENOSCOPY N/A 02/16/2013   Procedure: ESOPHAGOGASTRODUODENOSCOPY (EGD);  Surgeon: Daneil Dolin, MD;  [facility];  Service: Endoscopy;  Laterality: N/A;  8:30  . GIVENS CAPSULE STUDY N/A 02/16/2013   EGD with capsule placement. no evidence of paraesophageal hernia. SB essentially unremarkable.  Marland Kitchen HIATAL HERNIA REPAIR N/A 03/21/2017   Procedure: LAPAROSCOPIC lysis of adhesions,reduction hiatal hernia, upper endoscopy,gastropexy;  Surgeon: Johnathan Hausen, MD;  [facility];  Service: General;  Laterality: N/A;  . KNOT REMOVED FROM SCALP    . LAPAROSCOPIC NISSEN FUNDOPLICATION N/A 11/06/6438   Procedure: LAPAROSCOPIC NISSEN FUNDOPLICATION AND HIATAL HERNIA REPAIR;  Surgeon: Johnathan Hausen, MD;  [facility];  Service: General;  Laterality: N/A;      Current Outpatient Medications:  .  acetaminophen (TYLENOL) 500 MG tablet, Take 1,000 mg by mouth daily as needed for headache., Disp: , Rfl:  .  anastrozole (ARIMIDEX) 1 MG tablet, Take 1 tablet by mouth once daily, Disp: 90 tablet, Rfl: 1 .  aspirin 81 MG tablet, Take 81 mg by mouth every morning. , Disp: , Rfl:  .  atorvastatin (LIPITOR) 80 MG tablet, Take 40 mg by mouth every evening., Disp: , Rfl:  .  cimetidine (TAGAMET) 200 MG tablet, Take 200 mg by  mouth 2 (two) times daily., Disp: , Rfl:  .  enalapril (VASOTEC) 10 MG tablet, Take 10 mg by mouth daily. , Disp: , Rfl:  .  ondansetron (ZOFRAN-ODT) 4 MG disintegrating tablet, Take 1 tablet (4 mg total) by mouth every 6 (six) hours as needed for nausea., Disp: 20 tablet, Rfl: 0 .  Simethicone (GAS-X PO), Take 1-2 tablets by mouth 2 (two) times daily as needed (stomach irritation)., Disp: , Rfl:     Physical Exam: Blood pressure (!) 150/90,  pulse 94, temperature (!) 95.6 F (35.3 C), height 5\' 1"  (1.549 m), weight 144 lb 6.4 oz (65.5 kg), SpO2 97 %.   Affect appropriate Healthy:  appears stated age 89: normal Neck supple with no adenopathy JVP normal no bruits no thyromegaly Lungs clear with no wheezing and good diaphragmatic motion Heart:  S1/S2 no murmur, no rub, gallop or click PMI normal post left breast lumpectomy  Abdomen: benighn, BS positve, no tenderness, no AAA post lap surgery  no bruit.  No HSM or HJR Distal pulses intact with no bruits No edema Neuro non-focal Skin warm and dry Right knee arthritis    Labs:   Lab Results  Component Value Date   WBC 12.6 (H) 03/22/2017   HGB 13.2 03/22/2017   HCT 41.2 03/22/2017   MCV 89.6 03/22/2017   PLT 206 03/22/2017   No results for input(s): NA, K, CL, CO2, BUN, CREATININE, CALCIUM, PROT, BILITOT, ALKPHOS, ALT, AST, GLUCOSE in the last 168 hours.  Invalid input(s): LABALBU Lab Results  Component Value Date   TROPONINI <0.03 11/24/2016   No results found for: CHOL No results found for: HDL No results found for: LDLCALC No results found for: TRIG No results found for: CHOLHDL No results found for: LDLDIRECT    Radiology: No results found.  EKG: 06/14/19 SR rate 85 normal    ASSESSMENT AND PLAN:   1. HtN:  Up in office today she did not take her BP med discussed compliance and low sodium diet  2. HLD:  On statin labs with primary 3. Breast Cancer : stage 1A f/u Dr Burr Medico Lanell Persons Lucia Gaskins on arimidex  4.  Arthritis: right knee f/u Dr Luna Glasgow  5. Syncope: distant history normal ECG and echo 2018 ? Vagal observe   Signed: Jenkins Rouge 02/11/2020, 9:01 AM

## 2020-02-11 ENCOUNTER — Encounter: Payer: Self-pay | Admitting: Cardiovascular Disease

## 2020-02-11 ENCOUNTER — Ambulatory Visit (INDEPENDENT_AMBULATORY_CARE_PROVIDER_SITE_OTHER): Payer: Medicare Other | Admitting: Cardiovascular Disease

## 2020-02-11 VITALS — BP 150/90 | HR 94 | Temp 95.6°F | Ht 61.0 in | Wt 144.4 lb

## 2020-02-11 DIAGNOSIS — R55 Syncope and collapse: Secondary | ICD-10-CM

## 2020-02-11 NOTE — Patient Instructions (Signed)
Medication Instructions:  Your physician recommends that you continue on your current medications as directed. Please refer to the Current Medication list given to you today.  *If you need a refill on your cardiac medications before your next appointment, please call your pharmacy*   Lab Work: NONE   If you have labs (blood work) drawn today and your tests are completely normal, you will receive your results only by: . MyChart Message (if you have MyChart) OR . A paper copy in the mail If you have any lab test that is abnormal or we need to change your treatment, we will call you to review the results.   Testing/Procedures: NONE    Follow-Up: At [facility], you and your health needs are our priority.  As part of our continuing mission to provide you with exceptional heart care, we have created designated Provider Care Teams.  These Care Teams include your primary Cardiologist (physician) and Advanced Practice Providers (APPs -  Physician Assistants and Nurse Practitioners) who all work together to provide you with the care you need, when you need it.  We recommend signing up for the patient portal called "MyChart".  Sign up information is provided on this After Visit Summary.  MyChart is used to connect with patients for Virtual Visits (Telemedicine).  Patients are able to view lab/test results, encounter notes, upcoming appointments, etc.  Non-urgent messages can be sent to your provider as well.   To learn more about what you can do with MyChart, go to [email or web address].    Your next appointment:   1 year(s)  The format for your next appointment:   In Person  Provider:   Peter Nishan, MD   Other Instructions Thank you for choosing [facility]!    

## 2020-02-28 ENCOUNTER — Encounter: Payer: Self-pay | Admitting: Nurse Practitioner

## 2020-03-02 NOTE — Progress Notes (Signed)
[facility]   [phone] [phone]   Clinic Follow up Note   Patient Care Team: Sharilyn Sites, MD as PCP - General (Family Medicine) Gala Romney, Cristopher Estimable, MD as Consulting Physician (Gastroenterology) Alphonsa Overall, MD as Consulting Physician (General Surgery) Eppie Gibson, MD as Attending Physician (Radiation Oncology) Truitt Merle, MD as Consulting Physician (Hematology) Mauro Kaufmann, RN as Oncology Nurse Navigator Rockwell Germany, RN as Oncology Nurse Navigator Alla Feeling, NP as Nurse Practitioner (Nurse Practitioner)  Date of Service:  03/09/2020  CHIEF COMPLAINT: F/u of left breast cancer   SUMMARY OF ONCOLOGIC HISTORY: Oncology History Overview Note  Cancer Staging Carcinoma of upper-outer quadrant of left breast in female, estrogen receptor positive ([place]) Staging form: Breast, AJCC 8th Edition - Clinical stage from 05/05/2019: Stage IB (cT1c, cN1, cM0, G2, ER+, PR+, HER2-) - Signed by Eppie Gibson, MD on 05/05/2019 - Pathologic stage from 06/17/2019: Stage IA (pT1b, pN1a, cM0, G2, ER+, PR+, HER2-) - Signed by Truitt Merle, MD on 09/10/2019    Carcinoma of upper-outer quadrant of left breast in female, estrogen receptor positive ([place])  04/06/2019 Mammogram   Diagnostic mammogram 04/06/19  IMPRESSION: 1. 2 x 1.6 x 1.9cm irregular breast mass with associated architectural distortion in the 2 o'clock position of the left breast, 6 cm from the nipple corresponding to the palpable abnormality. This is highly suspicious for breast malignancy. 2. Small probably benign mass with central calcifications in the 7 o'clock position of the breast, measuring 7x4x7 mm. This likely corresponds to a focal opacity with calcifications seen mammographically that has been stable. 3. Single abnormal left axillary lymph node with a cortical thickness of 6 mm.   04/13/2019 Initial Biopsy   Diagnosis 04/13/19 1. Breast, left, needle core biopsy, upper outer quadrant  at 2:00 - INVASIVE MAMMARY CARCINOMA, GRADE II. 2. Lymph node, needle/core biopsy, left axilla - METASTATIC CARCINOMA.   04/13/2019 Receptors her2   Results: GROUP 5: HER2 **NEGATIVE** Estrogen Receptor: 100%, POSITIVE, STRONG STAINING INTENSITY Progesterone Receptor: 5%, POSITIVE, STRONG STAINING INTENSITY Proliferation Marker Ki67: 15%   05/05/2019 Initial Diagnosis   Carcinoma of upper-outer quadrant of left breast in female, estrogen receptor positive ([place])   05/05/2019 Cancer Staging   Staging form: Breast, AJCC 8th Edition - Clinical stage from 05/05/2019: Stage IB (cT1c, cN1, cM0, G2, ER+, PR+, HER2-) - Signed by Eppie Gibson, MD on 05/05/2019   05/18/2019 Pathology Results   FINAL MICROSCOPIC DIAGNOSIS: 05/18/19 A. BREAST, LEFT, BIOPSY:  - Invasive ductal carcinoma, grade 2.  See comment  - Ductal carcinoma in situ, intermediate grade    06/17/2019 Surgery   LEFT BREAST LUMPECTOMY X2 WITH RADIOACTIVE SEED X2 AND LEFT SEED TARGETED AXILLARY LYMPH NODE EXCISION AND SENTINEL LYMPH NODE EXCISION  by Dr Lucia Gaskins 06/17/19   06/17/2019 Pathology Results   FINAL MICROSCOPIC DIAGNOSIS: 06/17/19  A. BREAST, LEFT, 7 O'CLOCK, LUMPECTOMY:  - Invasive ductal carcinoma, 0.7 cm, Nottingham grade 2 of 3.  - Margins of resection are not involved (Closest margin: Less than 1 mm,  Superior/medial).  - See oncology table.   B. BREAST, LEFT, 2 O'CLOCK, LUMPECTOMY:  - Invasive ductal carcinoma, 2.8 cm, Nottingham grade 2 of 3.  - Margins of resection are not involved (Closest margin: Less than 1 mm,  Posterior).  - Ductal carcinoma in situ.  - See oncology table.   C. BREAST, LEFT, ADDITIONAL INFERIOR MARGIN, 7 O'CLOCK, EXCISION:  - Breast tissue, negative for carcinoma.   D. SENTINEL LYMPH NODE, LEFT  AXILLARY, BIOPSY:  - Metastatic carcinoma present in one of one lymph node (1/1).  - Biopsy site changes.   E. SENTINEL LYMPH NODE, LEFT AXILLARY, BIOPSY:  - Metastatic carcinoma present in  one of one lymph node (1/1).   F. SENTINEL LYMPH NODE, LEFT AXILLARY, BIOPSY:  - One lymph node, negative for carcinoma (0/1).      06/17/2019 Miscellaneous   Mammaprint  Low Risk Luminal Type A with 10% risk of recurrence in 10 years.  Index +0.436 97.8% benefit of hormonal therapy.    06/17/2019 Cancer Staging   Staging form: Breast, AJCC 8th Edition - Pathologic stage from 06/17/2019: Stage IA (pT1b, pN1a, cM0, G2, ER+, PR+, HER2-) - Signed by Truitt Merle, MD on 09/10/2019   07/21/2019 - 09/02/2019 Radiation Therapy   Adjuvant radiation with Dr Isidore Moos 07/21/19-09/02/19   09/2019 -  Anti-estrogen oral therapy   Anastrozole '1mg'$  daily starting 09/2019    12/09/2019 Survivorship   Virtual - SCP delivered by Cira Rue, NP       CURRENT THERAPY:  Anastrozole '1mg'$  daily starting 09/2019   INTERVAL HISTORY:  Courtney Dorsey is here for a follow up of left breast cancer. She was last seen by me 6 months ago and seen by NP Lacie 3 months ago. She presents to the clinic alone. She notes she is doing well. She notes on Anastrozole she feels more emotional and edgy so she plans to return to taking this medication at night. She notes her family has noticed it. She is not interested in antidepressant medication. She does not have hot flashes and her arthritis in knee is baseline or improved.     REVIEW OF SYSTEMS:   Constitutional: Denies fevers, chills or abnormal weight loss Eyes: Denies blurriness of vision Ears, nose, mouth, throat, and face: Denies mucositis or sore throat Respiratory: Denies cough, dyspnea or wheezes Cardiovascular: Denies palpitation, chest discomfort or lower extremity swelling Gastrointestinal:  Denies nausea, heartburn or change in bowel habits Skin: Denies abnormal skin rashes MSK(+) Improved on left knee arthritis  Lymphatics: Denies new lymphadenopathy or easy bruising Neurological:Denies numbness, tingling or new weaknesses Behavioral/Psych: (+) Mood  swings  All other systems were reviewed with the patient and are negative.  MEDICAL HISTORY:  Past Medical History:  Diagnosis Date  . Anemia   . Diabetes mellitus without complication ([place])    "BORDERLINE", off all medication  . Diverticulosis   . Heart murmur    childhood   . History of hiatal hernia   . History of radiation therapy 07/21/19- 09/02/19   Left Breast 25 fx of 2 Gy each to total 50 Gy. Left Breast boost 5 fx of 2 Gy each to total 10 Gy  . History of skin cancer   . History of transfusion   . Hypercholesteremia   . Hypertension   . Skin cancer   . UTI (lower urinary tract infection)    TAKING ANTIBIOTICS    SURGICAL HISTORY: Past Surgical History:  Procedure Laterality Date  . BREAST LUMPECTOMY WITH RADIOACTIVE SEED AND AXILLARY LYMPH NODE DISSECTION Left 06/17/2019   Procedure: LEFT BREAST LUMPECTOMY X2  WITH RADIOACTIVE SEED X2 AND LEFT SEED TARGETED AXILLARY LYMPH NODE EXCISION AND SENTINEL LYMPH NODE EXCISION;  Surgeon: Alphonsa Overall, MD;  Location: [facility];  Service: General;  Laterality: Left;  . CHOLECYSTECTOMY    . COLONOSCOPY  2009   Dr. Aviva Signs  . COLONOSCOPY N/A 01/01/2013   PNT:IRWERX mucosa in the terminal ileum  Mild diverticulosis in the descending colon and sigmoid colon and sigmoid colon/Moderate sized internal hemorrhoids  . ESOPHAGOGASTRODUODENOSCOPY N/A 01/01/2013   TMH:DQQIW hiatal hernia/ MODERATE SIZE PARA-ESOPHAGEAL HERNIA/MILD Erosive gastritis. chronic duodenitis c/w peptic duodenitis. no h.pylori or villous atrophy. minimal chronic gastritis.   Marland Kitchen ESOPHAGOGASTRODUODENOSCOPY N/A 02/16/2013   Procedure: ESOPHAGOGASTRODUODENOSCOPY (EGD);  Surgeon: Daneil Dolin, MD;  [facility];  Service: Endoscopy;  Laterality: N/A;  8:30  . GIVENS CAPSULE STUDY N/A 02/16/2013   EGD with capsule placement. no evidence of paraesophageal hernia. SB essentially unremarkable.  Marland Kitchen HIATAL HERNIA REPAIR N/A 03/21/2017   Procedure:  LAPAROSCOPIC lysis of adhesions,reduction hiatal hernia, upper endoscopy,gastropexy;  Surgeon: Johnathan Hausen, MD;  [facility];  Service: General;  Laterality: N/A;  . KNOT REMOVED FROM SCALP    . LAPAROSCOPIC NISSEN FUNDOPLICATION N/A 05/10/9891   Procedure: LAPAROSCOPIC NISSEN FUNDOPLICATION AND HIATAL HERNIA REPAIR;  Surgeon: Johnathan Hausen, MD;  [facility];  Service: General;  Laterality: N/A;    I have reviewed the social history and family history with the patient and they are unchanged from previous note.  ALLERGIES:  has No Known Allergies.  MEDICATIONS:  Current Outpatient Medications  Medication Sig Dispense Refill  . acetaminophen (TYLENOL) 500 MG tablet Take 1,000 mg by mouth daily as needed for headache.    . anastrozole (ARIMIDEX) 1 MG tablet Take 1 tablet by mouth once daily 90 tablet 1  . aspirin 81 MG tablet Take 81 mg by mouth every morning.     Marland Kitchen atorvastatin (LIPITOR) 80 MG tablet Take 40 mg by mouth every evening.    . cimetidine (TAGAMET) 200 MG tablet Take 200 mg by mouth 2 (two) times daily.    . enalapril (VASOTEC) 10 MG tablet Take 10 mg by mouth daily.     . ondansetron (ZOFRAN-ODT) 4 MG disintegrating tablet Take 1 tablet (4 mg total) by mouth every 6 (six) hours as needed for nausea. 20 tablet 0  . Simethicone (GAS-X PO) Take 1-2 tablets by mouth 2 (two) times daily as needed (stomach irritation).     No current facility-administered medications for this visit.    PHYSICAL EXAMINATION: ECOG PERFORMANCE STATUS: 0 - Asymptomatic  Vitals:   03/09/20 1112  BP: (!) 171/88  Pulse: 93  Resp: 18  Temp: 97.8 F (36.6 C)  SpO2: 98%   Filed Weights   03/09/20 1112  Weight: 144 lb 11.2 oz (65.6 kg)    GENERAL:alert, no distress and comfortable SKIN: skin color, texture, turgor are normal, no rashes or significant lesions (+) Mid chest skin erythema EYES: normal, Conjunctiva are pink and non-injected, sclera clear  NECK: supple, thyroid normal  size, non-tender, without nodularity LYMPH:  no palpable lymphadenopathy in the cervical, axillary  LUNGS: clear to auscultation and percussion with normal breathing effort HEART: regular rate & rhythm and no murmurs and no lower extremity edema ABDOMEN:abdomen soft, non-tender and normal bowel sounds Musculoskeletal:no cyanosis of digits and no clubbing  NEURO: alert & oriented x 3 with fluent speech, no focal motor/sensory deficits BREAST: S/p double left lumpectomy: Surgical incisions healed well. (+) Mild left breast lymphedema. no palpable mass, nodules bilaterally. Breast exam benign.   LABORATORY DATA:  I have reviewed the data as listed CBC Latest Ref Rng & Units 03/09/2020 03/22/2017 03/21/2017  WBC 4.0 - 10.5 K/uL 7.9 12.6(H) 19.6(H)  Hemoglobin 12.0 - 15.0 g/dL 15.0 13.2 14.4  Hematocrit 36 - 46 % 46.8(H) 41.2 43.8  Platelets 150 - 400 K/uL  218 206 200     CMP Latest Ref Rng & Units 03/09/2020 03/22/2017 03/21/2017  Glucose 70 - 99 mg/dL 96 528(U) -  BUN 8 - 23 mg/dL 13 14 -  Creatinine 1.32 - 1.00 mg/dL 4.40 1.02 7.25  Sodium 135 - 145 mmol/L 143 142 -  Potassium 3.5 - 5.1 mmol/L 4.3 4.0 -  Chloride 98 - 111 mmol/L 105 106 -  CO2 22 - 32 mmol/L 27 27 -  Calcium 8.9 - 10.3 mg/dL 9.6 3.6(U) -  Total Protein 6.5 - 8.1 g/dL 7.1 - -  Total Bilirubin 0.3 - 1.2 mg/dL 0.5 - -  Alkaline Phos 38 - 126 U/L 90 - -  AST 15 - 41 U/L 19 - -  ALT 0 - 44 U/L 23 - -      RADIOGRAPHIC STUDIES: I have personally reviewed the radiological images as listed and agreed with the findings in the report. No results found.   ASSESSMENT & PLAN:  JOHNNETTE LAUX is a 74 y.o. 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 Her 2:00 position left breast and LN biopsy showed invasiveductalcarcinoma withnodemetastasis.Low Ki 67 15%. Her 7:00  position left breastlesionbiopsy on 05/18/19 showed invasive ductal carcinoma, grade 2.  -She underwent left breast double lumpectomy and LN excision. Path showed complete surgical resection with 2/3 positive lymph nodes, negative margins. Her mammaprint returned low risk. Adjuvant chemo was not recommended.  -She did not proceed with genetic testing. Her niece had breast cancer and Her mother and brother both had colon cancer.  -She completed adjuvant radiation to reduce her risk of local recurrence.  -I started her on antiestrogen therapy with Anastrozole in 09/2019.  -She has been tolerating anastrozole well with mood swings and baseline left knee arthritis is stable. She is not interested in antidepressants at this time.  -She is clinically doing well. Lab reviewed, her CBC and CMP are within normal limits. Her physical exam was benign. There is no clinical concern for recurrence. -Continue surveillance. She is due to mammogram in 04/2020  -Continue Anastrozole.  -F/u in 4 months   2. HTN -On medications  -BP at 171/88 today (03/09/20).  -She will continue to monitor at home and F/u with her PCP as Anastrozole can contribute to elevated BP.    3. Smoking Cessation  -She has a long h/o smoking. She has been trying to quit but stopped using nicotine patch.  -I encouraged her to start weaning herself off the amount she smokes until she completely quits. She is agreeable.    4. Bone Health  -I discussed AI such as Anastrozole can weaken her bone. Will monitor with DEXA scan every 2 years.  -Her prior DEXA was 2015. Her latest DEXA from 10/2019 was normal with lowest T-score 0.3.    PLAN: -Continue Anastrozole  -Mammogram in 04/2020, she will call [facility]  -Lab and F/u in 4 months   No problem-specific Assessment & Plan notes found for this encounter.   No orders of the defined types were placed in this encounter.  All questions were answered. The patient knows to call the clinic with  any problems, questions or concerns. No barriers to learning was detected. The total time spent in the appointment was 30 minutes.     Malachy Mood, MD 03/09/2020   I, Delphina Cahill, am acting as scribe for [employer]  Burr Medico, MD.   I have reviewed the above documentation for accuracy and completeness, and I agree with the above.

## 2020-03-08 ENCOUNTER — Other Ambulatory Visit: Payer: Self-pay

## 2020-03-08 DIAGNOSIS — Z170 Estrogen receptor positive status [ER+]: Secondary | ICD-10-CM

## 2020-03-09 ENCOUNTER — Other Ambulatory Visit: Payer: Self-pay

## 2020-03-09 ENCOUNTER — Inpatient Hospital Stay: Payer: Medicare Other | Attending: Hematology | Admitting: Hematology

## 2020-03-09 ENCOUNTER — Inpatient Hospital Stay: Payer: Medicare Other

## 2020-03-09 ENCOUNTER — Encounter: Payer: Self-pay | Admitting: Hematology

## 2020-03-09 VITALS — BP 171/88 | HR 93 | Temp 97.8°F | Resp 18 | Ht 61.0 in | Wt 144.7 lb

## 2020-03-09 DIAGNOSIS — M1712 Unilateral primary osteoarthritis, left knee: Secondary | ICD-10-CM | POA: Insufficient documentation

## 2020-03-09 DIAGNOSIS — Z87440 Personal history of urinary (tract) infections: Secondary | ICD-10-CM | POA: Insufficient documentation

## 2020-03-09 DIAGNOSIS — C50412 Malignant neoplasm of upper-outer quadrant of left female breast: Secondary | ICD-10-CM | POA: Diagnosis not present

## 2020-03-09 DIAGNOSIS — Z803 Family history of malignant neoplasm of breast: Secondary | ICD-10-CM | POA: Diagnosis not present

## 2020-03-09 DIAGNOSIS — Z8719 Personal history of other diseases of the digestive system: Secondary | ICD-10-CM | POA: Insufficient documentation

## 2020-03-09 DIAGNOSIS — Z170 Estrogen receptor positive status [ER+]: Secondary | ICD-10-CM | POA: Insufficient documentation

## 2020-03-09 DIAGNOSIS — Z85828 Personal history of other malignant neoplasm of skin: Secondary | ICD-10-CM | POA: Diagnosis not present

## 2020-03-09 DIAGNOSIS — I10 Essential (primary) hypertension: Secondary | ICD-10-CM | POA: Diagnosis not present

## 2020-03-09 DIAGNOSIS — Z800 Family history of malignant neoplasm of digestive organs: Secondary | ICD-10-CM | POA: Insufficient documentation

## 2020-03-09 DIAGNOSIS — C773 Secondary and unspecified malignant neoplasm of axilla and upper limb lymph nodes: Secondary | ICD-10-CM | POA: Diagnosis not present

## 2020-03-09 DIAGNOSIS — Z79899 Other long term (current) drug therapy: Secondary | ICD-10-CM | POA: Diagnosis not present

## 2020-03-09 DIAGNOSIS — Z79811 Long term (current) use of aromatase inhibitors: Secondary | ICD-10-CM | POA: Insufficient documentation

## 2020-03-09 LAB — CMP (CANCER CENTER ONLY)
ALT: 23 U/L (ref 0–44)
AST: 19 U/L (ref 15–41)
Albumin: 3.8 g/dL (ref 3.5–5.0)
Alkaline Phosphatase: 90 U/L (ref 38–126)
Anion gap: 11 (ref 5–15)
BUN: 13 mg/dL (ref 8–23)
CO2: 27 mmol/L (ref 22–32)
Calcium: 9.6 mg/dL (ref 8.9–10.3)
Chloride: 105 mmol/L (ref 98–111)
Creatinine: 0.85 mg/dL (ref 0.44–1.00)
GFR, Est AFR Am: >60 mL/min (ref >60)
GFR, Estimated: >60 mL/min (ref >60)
Glucose, Bld: 96 mg/dL (ref 70–99)
Potassium: 4.3 mmol/L (ref 3.5–5.1)
Sodium: 143 mmol/L (ref 135–145)
Total Bilirubin: 0.5 mg/dL (ref 0.3–1.2)
Total Protein: 7.1 g/dL (ref 6.5–8.1)

## 2020-03-09 LAB — CBC WITH DIFFERENTIAL (CANCER CENTER ONLY)
Abs Immature Granulocytes: 0.04 10*3/uL (ref 0.00–0.07)
Basophils Absolute: 0.1 10*3/uL (ref 0.0–0.1)
Basophils Relative: 1 %
Eosinophils Absolute: 0.2 10*3/uL (ref 0.0–0.5)
Eosinophils Relative: 2 %
HCT: 46.8 % — ABNORMAL HIGH (ref 36.0–46.0)
Hemoglobin: 15 g/dL (ref 12.0–15.0)
Immature Granulocytes: 1 %
Lymphocytes Relative: 10 %
Lymphs Abs: 0.8 10*3/uL (ref 0.7–4.0)
MCH: 28.8 pg (ref 26.0–34.0)
MCHC: 32.1 g/dL (ref 30.0–36.0)
MCV: 89.8 fL (ref 80.0–100.0)
Monocytes Absolute: 0.7 10*3/uL (ref 0.1–1.0)
Monocytes Relative: 9 %
Neutro Abs: 6.1 10*3/uL (ref 1.7–7.7)
Neutrophils Relative %: 77 %
Platelet Count: 218 10*3/uL (ref 150–400)
RBC: 5.21 MIL/uL — ABNORMAL HIGH (ref 3.87–5.11)
RDW: 12.9 % (ref 11.5–15.5)
WBC Count: 7.9 10*3/uL (ref 4.0–10.5)
nRBC: 0 % (ref 0.0–0.2)

## 2020-03-10 ENCOUNTER — Encounter: Payer: Self-pay | Admitting: Nurse Practitioner

## 2020-03-13 ENCOUNTER — Telehealth: Payer: Self-pay

## 2020-03-13 NOTE — Telephone Encounter (Signed)
Call placed message left to update pt on upcoming mammogram appt 04/11/20 at 10am this nurse also updated message in my chart

## 2020-03-20 ENCOUNTER — Other Ambulatory Visit (HOSPITAL_COMMUNITY): Payer: Self-pay | Admitting: Nurse Practitioner

## 2020-03-20 DIAGNOSIS — Z9889 Other specified postprocedural states: Secondary | ICD-10-CM

## 2020-04-11 ENCOUNTER — Ambulatory Visit (HOSPITAL_COMMUNITY)
Admission: RE | Admit: 2020-04-11 | Discharge: 2020-04-11 | Disposition: A | Discharge status: Home / Self Care | Payer: Medicare Other | Source: Ambulatory Visit | Attending: Nurse Practitioner | Admitting: Nurse Practitioner

## 2020-04-11 ENCOUNTER — Other Ambulatory Visit: Payer: Self-pay

## 2020-04-11 ENCOUNTER — Encounter (HOSPITAL_COMMUNITY): Payer: Self-pay

## 2020-04-11 DIAGNOSIS — Z9889 Other specified postprocedural states: Secondary | ICD-10-CM | POA: Insufficient documentation

## 2020-04-11 DIAGNOSIS — C50412 Malignant neoplasm of upper-outer quadrant of left female breast: Secondary | ICD-10-CM | POA: Insufficient documentation

## 2020-04-11 DIAGNOSIS — Z170 Estrogen receptor positive status [ER+]: Secondary | ICD-10-CM | POA: Insufficient documentation

## 2020-04-11 DIAGNOSIS — R922 Inconclusive mammogram: Secondary | ICD-10-CM | POA: Diagnosis not present

## 2020-04-11 HISTORY — DX: Personal history of irradiation: Z92.3

## 2020-04-24 DIAGNOSIS — H35372 Puckering of macula, left eye: Secondary | ICD-10-CM | POA: Diagnosis not present

## 2020-04-24 DIAGNOSIS — H25813 Combined forms of age-related cataract, bilateral: Secondary | ICD-10-CM | POA: Diagnosis not present

## 2020-04-24 DIAGNOSIS — H43811 Vitreous degeneration, right eye: Secondary | ICD-10-CM | POA: Diagnosis not present

## 2020-04-24 DIAGNOSIS — E119 Type 2 diabetes mellitus without complications: Secondary | ICD-10-CM | POA: Diagnosis not present

## 2020-04-24 DIAGNOSIS — H17823 Peripheral opacity of cornea, bilateral: Secondary | ICD-10-CM | POA: Diagnosis not present

## 2020-06-01 DIAGNOSIS — H25811 Combined forms of age-related cataract, right eye: Secondary | ICD-10-CM | POA: Diagnosis not present

## 2020-06-11 DIAGNOSIS — H2512 Age-related nuclear cataract, left eye: Secondary | ICD-10-CM | POA: Diagnosis not present

## 2020-06-15 DIAGNOSIS — H2512 Age-related nuclear cataract, left eye: Secondary | ICD-10-CM | POA: Diagnosis not present

## 2020-06-21 DIAGNOSIS — Z23 Encounter for immunization: Secondary | ICD-10-CM | POA: Diagnosis not present

## 2020-07-12 NOTE — Progress Notes (Signed)
Courtney Dorsey   [phone] [phone]   Clinic Follow up Note   Patient Care Team: Courtney Sites, MD as PCP - General (Family Medicine) Courtney Dorsey, Courtney Estimable, MD as Consulting Physician (Gastroenterology) Courtney Overall, MD as Consulting Physician (General Surgery) Courtney Gibson, MD as Attending Physician (Radiation Oncology) Courtney Merle, MD as Consulting Physician (Hematology) Courtney Kaufmann, RN as Oncology Nurse Navigator Courtney Germany, RN as Oncology Nurse Navigator Courtney Feeling, NP as Nurse Practitioner (Nurse Practitioner)  Date of Service:  07/14/2020  CHIEF COMPLAINT: F/u of left breast cancer  SUMMARY OF ONCOLOGIC HISTORY: Oncology History Overview Note  Cancer Staging Carcinoma of upper-outer quadrant of left breast in female, estrogen receptor positive ([place]) Staging form: Breast, AJCC 8th Edition - Clinical stage from 05/05/2019: Stage IB (cT1c, cN1, cM0, G2, ER+, PR+, HER2-) - Signed by Courtney Gibson, MD on 05/05/2019 - Pathologic stage from 06/17/2019: Stage IA (pT1b, pN1a, cM0, G2, ER+, PR+, HER2-) - Signed by Courtney Merle, MD on 09/10/2019    Carcinoma of upper-outer quadrant of left breast in female, estrogen receptor positive ([place])  04/06/2019 Mammogram   Diagnostic mammogram 04/06/19  IMPRESSION: 1. 2 x 1.6 x 1.9cm irregular breast mass with associated architectural distortion in the 2 o'clock position of the left breast, 6 cm from the nipple corresponding to the palpable abnormality. This is highly suspicious for breast malignancy. 2. Small probably benign mass with central calcifications in the 7 o'clock position of the breast, measuring 7x4x7 mm. This likely corresponds to a focal opacity with calcifications seen mammographically that has been stable. 3. Single abnormal left axillary lymph node with a cortical thickness of 6 mm.   04/13/2019 Initial Biopsy   Diagnosis 04/13/19 1. Breast, left, needle core biopsy, upper outer quadrant  at 2:00 - INVASIVE MAMMARY CARCINOMA, GRADE II. 2. Lymph node, needle/core biopsy, left axilla - METASTATIC CARCINOMA.   04/13/2019 Receptors her2   Results: GROUP 5: HER2 **NEGATIVE** Estrogen Receptor: 100%, POSITIVE, STRONG STAINING INTENSITY Progesterone Receptor: 5%, POSITIVE, STRONG STAINING INTENSITY Proliferation Marker Ki67: 15%   05/05/2019 Initial Diagnosis   Carcinoma of upper-outer quadrant of left breast in female, estrogen receptor positive ([place])   05/05/2019 Cancer Staging   Staging form: Breast, AJCC 8th Edition - Clinical stage from 05/05/2019: Stage IB (cT1c, cN1, cM0, G2, ER+, PR+, HER2-) - Signed by Courtney Gibson, MD on 05/05/2019   05/18/2019 Pathology Results   FINAL MICROSCOPIC DIAGNOSIS: 05/18/19 A. BREAST, LEFT, BIOPSY:  - Invasive ductal carcinoma, grade 2.  See comment  - Ductal carcinoma in situ, intermediate grade    06/17/2019 Surgery   LEFT BREAST LUMPECTOMY X2 WITH RADIOACTIVE SEED X2 AND LEFT SEED TARGETED AXILLARY LYMPH NODE EXCISION AND SENTINEL LYMPH NODE EXCISION  by Dr Courtney Dorsey 06/17/19   06/17/2019 Pathology Results   FINAL MICROSCOPIC DIAGNOSIS: 06/17/19  A. BREAST, LEFT, 7 O'CLOCK, LUMPECTOMY:  - Invasive ductal carcinoma, 0.7 cm, Nottingham grade 2 of 3.  - Margins of resection are not involved (Closest margin: Less than 1 mm,  Superior/medial).  - See oncology table.   B. BREAST, LEFT, 2 O'CLOCK, LUMPECTOMY:  - Invasive ductal carcinoma, 2.8 cm, Nottingham grade 2 of 3.  - Margins of resection are not involved (Closest margin: Less than 1 mm,  Posterior).  - Ductal carcinoma in situ.  - See oncology table.   C. BREAST, LEFT, ADDITIONAL INFERIOR MARGIN, 7 O'CLOCK, EXCISION:  - Breast tissue, negative for carcinoma.   D. SENTINEL LYMPH NODE, LEFT AXILLARY,  BIOPSY:  - Metastatic carcinoma present in one of one lymph node (1/1).  - Biopsy site changes.   E. SENTINEL LYMPH NODE, LEFT AXILLARY, BIOPSY:  - Metastatic carcinoma present in  one of one lymph node (1/1).   F. SENTINEL LYMPH NODE, LEFT AXILLARY, BIOPSY:  - One lymph node, negative for carcinoma (0/1).      06/17/2019 Miscellaneous   Mammaprint  Low Risk Luminal Type A with 10% risk of recurrence in 10 years.  Index +0.436 97.8% benefit of hormonal therapy.    06/17/2019 Cancer Staging   Staging form: Breast, AJCC 8th Edition - Pathologic stage from 06/17/2019: Stage IA (pT1b, pN1a, cM0, G2, ER+, PR+, HER2-) - Signed by Courtney Merle, MD on 09/10/2019   07/21/2019 - 09/02/2019 Radiation Therapy   Adjuvant radiation with Dr Courtney Dorsey 07/21/19-09/02/19   09/2019 -  Anti-estrogen oral therapy   Anastrozole 42m daily starting 09/2019    12/09/2019 Survivorship   Virtual - SCP delivered by LCira Rue NP       CURRENT THERAPY:  Anastrozole 166mdaily starting 09/2019  INTERVAL HISTORY:  [name] here for a follow up of left breast cancer. She presents to the clinic alone. She notes about every 20 days she will have near syncope Dorsey with weakness. She denies headaches or blurred vision. She has been seen by many specialist without any etiology so far. Last episode yesterday.  She notes she is on Anastrozole. She notes she feels emotional and does not like how that feels. She notes this is tolerable so far. She denies no joint pain exacerbation.    REVIEW OF SYSTEMS:   Constitutional: Denies fevers, chills or abnormal weight loss Eyes: Denies blurriness of vision Ears, nose, mouth, throat, and face: Denies mucositis or sore throat Respiratory: Denies cough, dyspnea or wheezes Cardiovascular: Denies palpitation, chest discomfort or lower extremity swelling Gastrointestinal:  Denies nausea, heartburn or change in bowel habits Skin: Denies abnormal skin rashes Lymphatics: Denies new lymphadenopathy or easy bruising Neurological:Denies numbness, tingling or new weaknesses (+) Occasional body weakness  Behavioral/Psych: (+) Mood swings All other  systems were reviewed with the patient and are negative.  MEDICAL HISTORY:  Past Medical History:  Diagnosis Date   Anemia    Di[place]   "BORDERLINE", off all medication   Diverticulosis    Heart murmur    childhood    History of hiatal hernia    History of radiation therapy 07/21/19- 09/02/19   Left Breast 25 fx of 2 Gy each to total 50 Gy. Left Breast boost 5 fx of 2 Gy each to total 10 Gy   History of skin cancer    History of transfusion    Hypercholesteremia    Hypertension    Personal history of radiation therapy    Skin cancer    UTI (lower urinary tract infection)    TAKING ANTIBIOTICS    SURGICAL HISTORY: Past Surgical History:  Procedure Laterality Date   BREAST LUMPECTOMY Left    BREAST LUMPECTOMY WITH RADIOACTIVE SEED AND AXILLARY LYMPH NODE DISSECTION Left 06/17/2019   Procedure: LEFT BREAST LUMPECTOMY X2  WITH RADIOACTIVE SEED X2 AND LEFT SEED TARGETED AXILLARY LYMPH NODE EXCISION AND SENTINEL LYMPH NODE EXCISION;  Surgeon: NeAlphonsa OverallMD;  [facility] Service: General;  Laterality: Left;   CHOLECYSTECTOMY     COLONOSCOPY  2009   Dr. MaAviva Signs COLONOSCOPY N/A 01/01/2013   SLVAP:OLIDCVucosa in the  terminal ileum  Mild diverticulosis in the descending colon and sigmoid colon and sigmoid colon/Moderate sized internal hemorrhoids   ESOPHAGOGASTRODUODENOSCOPY N/A 01/01/2013   WLS:LHTDS hiatal hernia/ MODERATE SIZE PARA-ESOPHAGEAL HERNIA/MILD Erosive gastritis. chronic duodenitis c/w peptic duodenitis. no h.pylori or villous atrophy. minimal chronic gastritis.    ESOPHAGOGASTRODUODENOSCOPY N/A 02/16/2013   Procedure: ESOPHAGOGASTRODUODENOSCOPY (EGD);  Surgeon: Daneil Dolin, MD;  [facility];  Service: Endoscopy;  Laterality: N/A;  8:30   GIVENS CAPSULE STUDY N/A 02/16/2013   EGD with capsule placement. no evidence of paraesophageal hernia. SB essentially unremarkable.     HIATAL HERNIA REPAIR N/A 03/21/2017   Procedure: LAPAROSCOPIC lysis of adhesions,reduction hiatal hernia, upper endoscopy,gastropexy;  Surgeon: Johnathan Hausen, MD;  Location: [facility];  Service: General;  Laterality: N/A;   KNOT REMOVED FROM SCALP     LAPAROSCOPIC NISSEN FUNDOPLICATION N/A 10/11/7679   Procedure: LAPAROSCOPIC NISSEN FUNDOPLICATION AND HIATAL HERNIA REPAIR;  Surgeon: Johnathan Hausen, MD;  Location: [facility];  Service: General;  Laterality: N/A;    I have reviewed the social history and family history with the patient and they are unchanged from previous note.  ALLERGIES:  has No Known Allergies.  MEDICATIONS:  Current Outpatient Medications  Medication Sig Dispense Refill   acetaminophen (TYLENOL) 500 MG tablet Take 1,000 mg by mouth daily as needed for headache.     anastrozole (ARIMIDEX) 1 MG tablet Take 1 tablet (1 mg total) by mouth daily. 90 tablet 1   aspirin 81 MG tablet Take 81 mg by mouth every morning.      atorvastatin (LIPITOR) 80 MG tablet Take 40 mg by mouth every evening.     cimetidine (TAGAMET) 200 MG tablet Take 200 mg by mouth 2 (two) times daily.     enalapril (VASOTEC) 10 MG tablet Take 10 mg by mouth daily.      ondansetron (ZOFRAN-ODT) 4 MG disintegrating tablet Take 1 tablet (4 mg total) by mouth every 6 (six) hours as needed for nausea. 20 tablet 0   Simethicone (GAS-X PO) Take 1-2 tablets by mouth 2 (two) times daily as needed (stomach irritation).     No current facility-administered medications for this visit.    PHYSICAL EXAMINATION: ECOG PERFORMANCE STATUS: 0 - Asymptomatic  Vitals:   07/14/20 1034  BP: (!) 176/82  Pulse: 96  Resp: 18  Temp: 98.3 F (36.8 C)  SpO2: 98%   Filed Weights   07/14/20 1034  Weight: 141 lb 12.8 oz (64.3 kg)     GENERAL:alert, no distress and comfortable SKIN: skin color, texture, turgor are normal, no rashes or significant lesions EYES: normal, Conjunctiva are pink and non-injected, sclera  clear  NECK: supple, thyroid normal size, non-tender, without nodularity LYMPH:  no palpable lymphadenopathy in the cervical, axillary  LUNGS: clear to auscultation and percussion with normal breathing effort HEART: regular rate & rhythm and no murmurs and no lower extremity edema ABDOMEN:abdomen soft, non-tender and normal bowel sounds Musculoskeletal:no cyanosis of digits and no clubbing  NEURO: alert & oriented x 3 with fluent speech, no focal motor/sensory deficits BREAST: S/p left lumpectomy: surgical incision healed well with harder breast tissue and skin hyperpigmentation. No palpable mass, nodules or adenopathy bilaterally. Breast exam benign.   LABORATORY DATA:  I have reviewed the data as listed CBC Latest Ref Rng & Units 07/14/2020 03/09/2020 03/22/2017  WBC 4.0 - 10.5 K/uL 10.4 7.9 12.6(H)  Hemoglobin 12.0 - 15.0 g/dL 15.4(H) 15.0 13.2  Hematocrit 36 - 46 % 47.8(H) 46.8(H) 41.2  Platelets 150 - 400 K/uL 246 218 206     CMP Latest Ref Rng & Units 07/14/2020 03/09/2020 03/22/2017  Glucose 70 - 99 mg/dL 125(H) 96 107(H)  BUN 8 - 23 mg/dL '14 13 14  ' Creatinine 0.44 - 1.00 mg/dL 0.81 0.85 0.80  Sodium 135 - 145 mmol/L 141 143 142  Potassium 3.5 - 5.1 mmol/L 4.2 4.3 4.0  Chloride 98 - 111 mmol/L 104 105 106  CO2 22 - 32 mmol/L '29 27 27  ' Calcium 8.9 - 10.3 mg/dL 9.3 9.6 8.8(L)  Total Protein 6.5 - 8.1 g/dL 7.3 7.1 -  Total Bilirubin 0.3 - 1.2 mg/dL 0.6 0.5 -  Alkaline Phos 38 - 126 U/L 88 90 -  AST 15 - 41 U/L 20 19 -  ALT 0 - 44 U/L 24 23 -      RADIOGRAPHIC STUDIES: I have personally reviewed the radiological images as listed and agreed with the findings in the report. No results found.   ASSESSMENT & PLAN:  Courtney Dorsey is a 74 y.o. 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 Path showedcomplete surgical resection with 2/3 positive lymph nodes, negative margins. Her mammaprint returned low risk. Adjuvant chemo was not recommended. She completed adjuvant RT.  -She did not proceed with genetic testing.  -I started her on antiestrogen therapy with Anastrozole in 09/2019. She is tolerating well with mood swings.  -She is clinically doing well. Lab reviewed, her CBC and CMP are within normal limits except Hg 15.4, ANC 8.8, BG 125. Her physical exam and her 04/2020 mammogram were unremarkable. There is no clinical concern for recurrence. -Continue surveillance. She is due to mammogram in 04/2021 -Continue Anastrozole.  -F/u in 6 months    2. HTN -On medications, but uncontrolled Continue to f/u with PCP   3. Smoking Cessation  -She has a long h/o smoking 1ppd for at least 25 years. She has tried to quit and has been able to reduce use.  -I encouraged her to proceed with complete cessation. Her Hg is elevated, likely secondary to her smoking. She is willing to try.  4. Bone Health  -I discussed AI such as Anastrozole can weaken her bone. Will monitor with DEXA scan every 2 years.  -Her prior DEXA was 2015. Her latest DEXA from 10/2019 was normal with lowest T-score 0.3.   5. Weakness, near syncope  -Per pt she has occasional near syncope episodes with body weakness. She denies headaches or vision change.  -She does have high BP. I recommend she check her BP at home and f/u with PCP for better control.    PLAN: -Continue Anastrozole  -Lab and F/u in 6 months     No problem-specific Assessment & Plan notes found for this encounter.   No orders of the defined types were placed in this encounter.  All questions were answered. The patient knows to call the clinic with any problems, questions or concerns. No barriers to learning was detected. The total time spent in the appointment was 25 minutes.     Courtney Merle, MD 07/14/2020   I,  Joslyn Devon, am acting as scribe for Courtney Merle, MD.   I have reviewed the above documentation for accuracy and completeness, and I agree with the above.

## 2020-07-14 ENCOUNTER — Other Ambulatory Visit: Payer: Self-pay

## 2020-07-14 ENCOUNTER — Encounter: Payer: Self-pay | Admitting: Hematology

## 2020-07-14 ENCOUNTER — Inpatient Hospital Stay (HOSPITAL_BASED_OUTPATIENT_CLINIC_OR_DEPARTMENT_OTHER): Payer: Medicare Other | Admitting: Hematology

## 2020-07-14 ENCOUNTER — Inpatient Hospital Stay: Payer: Medicare Other | Attending: Hematology

## 2020-07-14 VITALS — BP 176/82 | HR 96 | Temp 98.3°F | Resp 18 | Ht 61.0 in | Wt 141.8 lb

## 2020-07-14 DIAGNOSIS — Z170 Estrogen receptor positive status [ER+]: Secondary | ICD-10-CM

## 2020-07-14 DIAGNOSIS — C773 Secondary and unspecified malignant neoplasm of axilla and upper limb lymph nodes: Secondary | ICD-10-CM | POA: Insufficient documentation

## 2020-07-14 DIAGNOSIS — R55 Syncope and collapse: Secondary | ICD-10-CM | POA: Diagnosis not present

## 2020-07-14 DIAGNOSIS — Z8719 Personal history of other diseases of the digestive system: Secondary | ICD-10-CM | POA: Insufficient documentation

## 2020-07-14 DIAGNOSIS — Z85828 Personal history of other malignant neoplasm of skin: Secondary | ICD-10-CM | POA: Insufficient documentation

## 2020-07-14 DIAGNOSIS — I10 Essential (primary) hypertension: Secondary | ICD-10-CM | POA: Diagnosis not present

## 2020-07-14 DIAGNOSIS — Z9049 Acquired absence of other specified parts of digestive tract: Secondary | ICD-10-CM | POA: Diagnosis not present

## 2020-07-14 DIAGNOSIS — R531 Weakness: Secondary | ICD-10-CM | POA: Insufficient documentation

## 2020-07-14 DIAGNOSIS — C50412 Malignant neoplasm of upper-outer quadrant of left female breast: Secondary | ICD-10-CM | POA: Diagnosis not present

## 2020-07-14 DIAGNOSIS — Z87891 Personal history of nicotine dependence: Secondary | ICD-10-CM | POA: Insufficient documentation

## 2020-07-14 DIAGNOSIS — Z923 Personal history of irradiation: Secondary | ICD-10-CM | POA: Diagnosis not present

## 2020-07-14 DIAGNOSIS — Z79811 Long term (current) use of aromatase inhibitors: Secondary | ICD-10-CM | POA: Diagnosis not present

## 2020-07-14 DIAGNOSIS — Z79899 Other long term (current) drug therapy: Secondary | ICD-10-CM | POA: Insufficient documentation

## 2020-07-14 DIAGNOSIS — Z87440 Personal history of urinary (tract) infections: Secondary | ICD-10-CM | POA: Diagnosis not present

## 2020-07-14 LAB — CBC WITH DIFFERENTIAL (CANCER CENTER ONLY)
Abs Immature Granulocytes: 0.03 10*3/uL (ref 0.00–0.07)
Basophils Absolute: 0.1 10*3/uL (ref 0.0–0.1)
Basophils Relative: 1 %
Eosinophils Absolute: 0.1 10*3/uL (ref 0.0–0.5)
Eosinophils Relative: 1 %
HCT: 47.8 % — ABNORMAL HIGH (ref 36.0–46.0)
Hemoglobin: 15.4 g/dL — ABNORMAL HIGH (ref 12.0–15.0)
Immature Granulocytes: 0 %
Lymphocytes Relative: 7 %
Lymphs Abs: 0.7 10*3/uL (ref 0.7–4.0)
MCH: 29 pg (ref 26.0–34.0)
MCHC: 32.2 g/dL (ref 30.0–36.0)
MCV: 90 fL (ref 80.0–100.0)
Monocytes Absolute: 0.7 10*3/uL (ref 0.1–1.0)
Monocytes Relative: 7 %
Neutro Abs: 8.8 10*3/uL — ABNORMAL HIGH (ref 1.7–7.7)
Neutrophils Relative %: 84 %
Platelet Count: 246 10*3/uL (ref 150–400)
RBC: 5.31 MIL/uL — ABNORMAL HIGH (ref 3.87–5.11)
RDW: 13.6 % (ref 11.5–15.5)
WBC Count: 10.4 10*3/uL (ref 4.0–10.5)
nRBC: 0 % (ref 0.0–0.2)

## 2020-07-14 LAB — CMP (CANCER CENTER ONLY)
ALT: 24 U/L (ref 0–44)
AST: 20 U/L (ref 15–41)
Albumin: 4 g/dL (ref 3.5–5.0)
Alkaline Phosphatase: 88 U/L (ref 38–126)
Anion gap: 8 (ref 5–15)
BUN: 14 mg/dL (ref 8–23)
CO2: 29 mmol/L (ref 22–32)
Calcium: 9.3 mg/dL (ref 8.9–10.3)
Chloride: 104 mmol/L (ref 98–111)
Creatinine: 0.81 mg/dL (ref 0.44–1.00)
GFR, Estimated: >60 mL/min (ref >60)
Glucose, Bld: 125 mg/dL — ABNORMAL HIGH (ref 70–99)
Potassium: 4.2 mmol/L (ref 3.5–5.1)
Sodium: 141 mmol/L (ref 135–145)
Total Bilirubin: 0.6 mg/dL (ref 0.3–1.2)
Total Protein: 7.3 g/dL (ref 6.5–8.1)

## 2020-07-14 MED ORDER — ANASTROZOLE 1 MG PO TABS: 1.0000 mg | ORAL_TABLET | Freq: Every day | ORAL | 1 refills | Status: DC

## 2020-07-17 ENCOUNTER — Telehealth: Payer: Self-pay | Admitting: Hematology

## 2020-07-17 NOTE — Telephone Encounter (Signed)
Scheduled per 11/12 los. Unable to reach pt. Left voicemail with appt times and date.

## 2020-08-06 ENCOUNTER — Encounter: Payer: Self-pay | Admitting: Nurse Practitioner

## 2020-08-10 ENCOUNTER — Other Ambulatory Visit: Payer: Self-pay

## 2020-08-10 ENCOUNTER — Encounter: Payer: Self-pay | Admitting: Orthopaedic Surgery

## 2020-08-10 ENCOUNTER — Ambulatory Visit (INDEPENDENT_AMBULATORY_CARE_PROVIDER_SITE_OTHER): Payer: Medicare Other | Admitting: Orthopaedic Surgery

## 2020-08-10 VITALS — BP 183/89 | HR 90 | Ht 61.0 in | Wt 141.0 lb

## 2020-08-10 DIAGNOSIS — M65312 Trigger thumb, left thumb: Secondary | ICD-10-CM | POA: Diagnosis not present

## 2020-08-10 NOTE — Progress Notes (Signed)
Patient XT:Courtney Dorsey, female DOB:1946/07/07, 74 y.o. DZH:[id number]  Chief Complaint  Patient presents with  . Hand Pain    Left thumb locking up for 3 weeks     HPI  Courtney Dorsey is a 74 y.o. female who has developed locking of the left thumb over the last three weeks that is not getting any better.  She bought a splint that helps.  She says it hurts when it locks.  She has no trauma, no redness.   Body mass index is 26.64 kg/m.  ROS  Review of Systems  Constitutional: Positive for activity change.  Musculoskeletal: Positive for gait problem and joint swelling.  All other systems reviewed and are negative.   All other systems reviewed and are negative.  The following is a summary of the past history medically, past history surgically, known current medicines, social history and family history.  This information is gathered electronically by the computer from prior information and documentation.  I review this each visit and have found including this information at this point in the chart is beneficial and informative.    Past Medical History:  Diagnosis Date  . Anemia   . Diabetes mellitus without complication ([place])    "BORDERLINE", off all medication  . Diverticulosis   . Heart murmur    childhood   . History of hiatal hernia   . History of radiation therapy 07/21/19- 09/02/19   Left Breast 25 fx of 2 Gy each to total 50 Gy. Left Breast boost 5 fx of 2 Gy each to total 10 Gy  . History of skin cancer   . History of transfusion   . Hypercholesteremia   . Hypertension   . Personal history of radiation therapy   . Skin cancer   . UTI (lower urinary tract infection)    TAKING ANTIBIOTICS    Past Surgical History:  Procedure Laterality Date  . BREAST LUMPECTOMY Left   . BREAST LUMPECTOMY WITH RADIOACTIVE SEED AND AXILLARY LYMPH NODE DISSECTION Left 06/17/2019   Procedure: LEFT BREAST LUMPECTOMY X2  WITH RADIOACTIVE SEED X2 AND LEFT SEED TARGETED AXILLARY  LYMPH NODE EXCISION AND SENTINEL LYMPH NODE EXCISION;  Surgeon: Alphonsa Overall, MD;  Location: [facility];  Service: General;  Laterality: Left;  . CHOLECYSTECTOMY    . COLONOSCOPY  2009   Dr. Aviva Signs  . COLONOSCOPY N/A 01/01/2013   MHD:QQIWLN mucosa in the terminal ileum  Mild diverticulosis in the descending colon and sigmoid colon and sigmoid colon/Moderate sized internal hemorrhoids  . ESOPHAGOGASTRODUODENOSCOPY N/A 01/01/2013   LGX:QJJHE hiatal hernia/ MODERATE SIZE PARA-ESOPHAGEAL HERNIA/MILD Erosive gastritis. chronic duodenitis c/w peptic duodenitis. no h.pylori or villous atrophy. minimal chronic gastritis.   Marland Kitchen ESOPHAGOGASTRODUODENOSCOPY N/A 02/16/2013   Procedure: ESOPHAGOGASTRODUODENOSCOPY (EGD);  Surgeon: Daneil Dolin, MD;  [facility];  Service: Endoscopy;  Laterality: N/A;  8:30  . GIVENS CAPSULE STUDY N/A 02/16/2013   EGD with capsule placement. no evidence of paraesophageal hernia. SB essentially unremarkable.  Marland Kitchen HIATAL HERNIA REPAIR N/A 03/21/2017   Procedure: LAPAROSCOPIC lysis of adhesions,reduction hiatal hernia, upper endoscopy,gastropexy;  Surgeon: Johnathan Hausen, MD;  [facility];  Service: General;  Laterality: N/A;  . KNOT REMOVED FROM SCALP    . LAPAROSCOPIC NISSEN FUNDOPLICATION N/A 09/09/4079   Procedure: LAPAROSCOPIC NISSEN FUNDOPLICATION AND HIATAL HERNIA REPAIR;  Surgeon: Johnathan Hausen, MD;  [facility];  Service: General;  Laterality: N/A;    Family History  Problem Relation Age of Onset  .  Colon cancer Mother        diagnosed around age 70  . Colon cancer Brother        age 32  . Diabetes Sister   . Diabetes Brother   . Cancer Niece 67       breast cancer   . Liver disease Neg Hx   . Lung cancer Neg Hx   . Breast cancer Neg Hx   . Ovarian cancer Neg Hx   . Celiac disease Neg Hx     Social History Social History   Tobacco Use  . Smoking status: Current Every Day Smoker    Packs/day: 1.00    Years: 28.00     Pack years: 28.00    Types: Cigarettes  . Smokeless tobacco: Never Used  . Tobacco comment: trying to quit  Vaping Use  . Vaping Use: Never used  Substance Use Topics  . Alcohol use: No  . Drug use: No    No Known Allergies  Current Outpatient Medications  Medication Sig Dispense Refill  . acetaminophen (TYLENOL) 500 MG tablet Take 1,000 mg by mouth daily as needed for headache.    . anastrozole (ARIMIDEX) 1 MG tablet Take 1 tablet (1 mg total) by mouth daily. 90 tablet 1  . aspirin 81 MG tablet Take 81 mg by mouth every morning.     Marland Kitchen atorvastatin (LIPITOR) 80 MG tablet Take 40 mg by mouth every evening.    . cimetidine (TAGAMET) 200 MG tablet Take 200 mg by mouth 2 (two) times daily.    . enalapril (VASOTEC) 10 MG tablet Take 10 mg by mouth daily.     . ondansetron (ZOFRAN-ODT) 4 MG disintegrating tablet Take 1 tablet (4 mg total) by mouth every 6 (six) hours as needed for nausea. 20 tablet 0  . Simethicone (GAS-X PO) Take 1-2 tablets by mouth 2 (two) times daily as needed (stomach irritation).     No current facility-administered medications for this visit.     Physical Exam  Blood pressure (!) 183/89, pulse 90, height 5\' 1"  (1.549 m), weight 141 lb (64 kg).  Constitutional: overall normal hygiene, normal nutrition, well developed, normal grooming, normal body habitus. Assistive device:thumb splint left  Musculoskeletal: gait and station Limp none, muscle tone and strength are normal, no tremors or atrophy is present.  .  Neurological: coordination overall normal.  Deep tendon reflex/nerve stretch intact.  Sensation normal.  Cranial nerves II-XII intact.   Skin:   Normal overall no scars, lesions, ulcers or rashes. No psoriasis.  Psychiatric: Alert and oriented x 3.  Recent memory intact, remote memory unclear.  Normal mood and affect. Well groomed.  Good eye contact.  Cardiovascular: overall no swelling, no varicosities, no edema bilaterally, normal temperatures of the  legs and arms, no clubbing, cyanosis and good capillary refill.  Lymphatic: palpation is normal.  All other systems reviewed and are negative   Left thumb has locking at the A1 pulley area and is tender.  NV intact.    The patient has been educated about the nature of the problem(s) and counseled on treatment options.  The patient appeared to understand what I have discussed and is in agreement with it.  Encounter Diagnosis  Name Primary?  . Trigger thumb, left thumb Yes   Procedure note: After permission from the patient and prep of the left thumb, the tendon sheath around the A1 pulley was injected with DepoMedrol 40 and 1 % xylocaine by sterile technique tolerated well.  I have told her that surgery is the definitive treatment. PLAN Call if any problems.  Precautions discussed.  Continue current medications.   Return to clinic 1 month   Consider surgery if not improved.  Electronically Signed Sanjuana Kava, MD 12/9/20219:10 AM

## 2020-08-29 DIAGNOSIS — Z23 Encounter for immunization: Secondary | ICD-10-CM | POA: Diagnosis not present

## 2020-09-05 ENCOUNTER — Other Ambulatory Visit: Payer: Self-pay

## 2020-09-05 DIAGNOSIS — C50412 Malignant neoplasm of upper-outer quadrant of left female breast: Secondary | ICD-10-CM

## 2020-09-05 MED ORDER — ANASTROZOLE 1 MG PO TABS: 1.0000 mg | ORAL_TABLET | Freq: Every day | ORAL | 3 refills | Status: DC

## 2020-09-12 ENCOUNTER — Ambulatory Visit: Payer: Medicare Other | Admitting: Orthopaedic Surgery

## 2020-10-17 DIAGNOSIS — Z0000 Encounter for general adult medical examination without abnormal findings: Secondary | ICD-10-CM | POA: Diagnosis not present

## 2020-10-17 DIAGNOSIS — K449 Diaphragmatic hernia without obstruction or gangrene: Secondary | ICD-10-CM | POA: Diagnosis not present

## 2020-10-17 DIAGNOSIS — E785 Hyperlipidemia, unspecified: Secondary | ICD-10-CM | POA: Diagnosis not present

## 2020-10-17 DIAGNOSIS — I10 Essential (primary) hypertension: Secondary | ICD-10-CM | POA: Diagnosis not present

## 2020-10-17 DIAGNOSIS — Z1331 Encounter for screening for depression: Secondary | ICD-10-CM | POA: Diagnosis not present

## 2020-10-17 DIAGNOSIS — Z1389 Encounter for screening for other disorder: Secondary | ICD-10-CM | POA: Diagnosis not present

## 2020-10-17 DIAGNOSIS — E119 Type 2 diabetes mellitus without complications: Secondary | ICD-10-CM | POA: Diagnosis not present

## 2020-10-17 DIAGNOSIS — E7849 Other hyperlipidemia: Secondary | ICD-10-CM | POA: Diagnosis not present

## 2020-10-17 DIAGNOSIS — Z6826 Body mass index (BMI) 26.0-26.9, adult: Secondary | ICD-10-CM | POA: Diagnosis not present

## 2020-11-13 DIAGNOSIS — K449 Diaphragmatic hernia without obstruction or gangrene: Secondary | ICD-10-CM | POA: Diagnosis not present

## 2020-11-13 DIAGNOSIS — Z800 Family history of malignant neoplasm of digestive organs: Secondary | ICD-10-CM | POA: Diagnosis not present

## 2020-11-13 DIAGNOSIS — R1013 Epigastric pain: Secondary | ICD-10-CM | POA: Diagnosis not present

## 2020-12-05 DIAGNOSIS — D120 Benign neoplasm of cecum: Secondary | ICD-10-CM | POA: Diagnosis not present

## 2020-12-05 DIAGNOSIS — R1013 Epigastric pain: Secondary | ICD-10-CM | POA: Diagnosis not present

## 2020-12-05 DIAGNOSIS — D123 Benign neoplasm of transverse colon: Secondary | ICD-10-CM | POA: Diagnosis not present

## 2020-12-05 DIAGNOSIS — Z800 Family history of malignant neoplasm of digestive organs: Secondary | ICD-10-CM | POA: Diagnosis not present

## 2020-12-05 DIAGNOSIS — D122 Benign neoplasm of ascending colon: Secondary | ICD-10-CM | POA: Diagnosis not present

## 2020-12-05 DIAGNOSIS — Z1211 Encounter for screening for malignant neoplasm of colon: Secondary | ICD-10-CM | POA: Diagnosis not present

## 2020-12-06 ENCOUNTER — Other Ambulatory Visit: Payer: Self-pay | Admitting: Gastroenterology

## 2020-12-06 DIAGNOSIS — R1013 Epigastric pain: Secondary | ICD-10-CM

## 2020-12-06 DIAGNOSIS — K449 Diaphragmatic hernia without obstruction or gangrene: Secondary | ICD-10-CM

## 2020-12-08 DIAGNOSIS — D120 Benign neoplasm of cecum: Secondary | ICD-10-CM | POA: Diagnosis not present

## 2020-12-08 DIAGNOSIS — D123 Benign neoplasm of transverse colon: Secondary | ICD-10-CM | POA: Diagnosis not present

## 2020-12-08 DIAGNOSIS — D122 Benign neoplasm of ascending colon: Secondary | ICD-10-CM | POA: Diagnosis not present

## 2020-12-17 IMAGING — MG DIGITAL DIAGNOSTIC BILAT W/ TOMO W/ CAD
9 series · 9 of 25 positions shown · non-contrast
Comparison: Previous exam(s).

CLINICAL DATA: 74-year-old female for annual follow-up. History of
2 sites of invasive LEFT breast carcinoma with 2 lumpectomies in
1010.

EXAM:
DIGITAL DIAGNOSTIC BILATERAL MAMMOGRAM WITH CAD AND TOMO

[L CC]
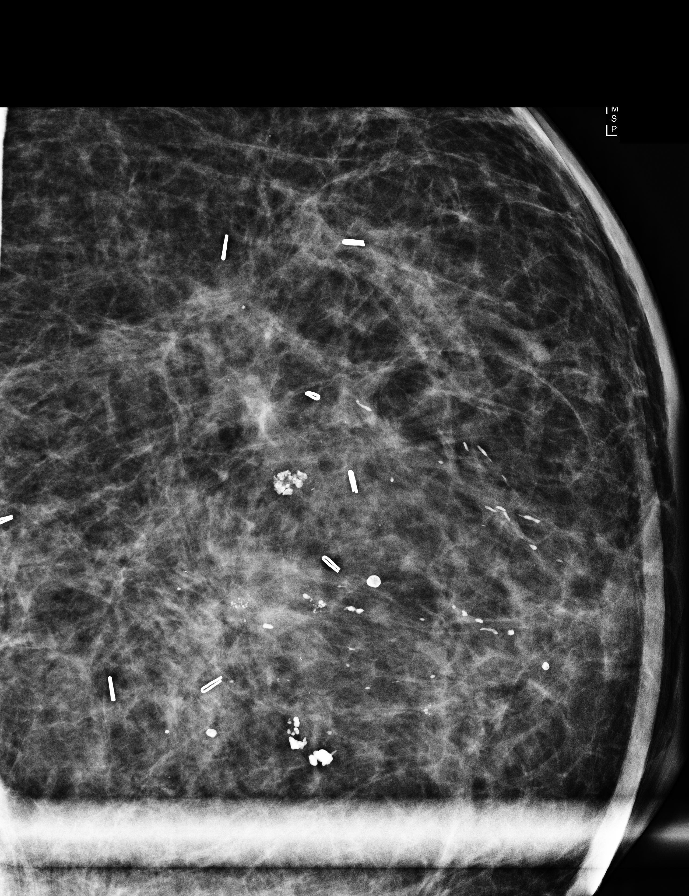

[R MLO synth-2D]
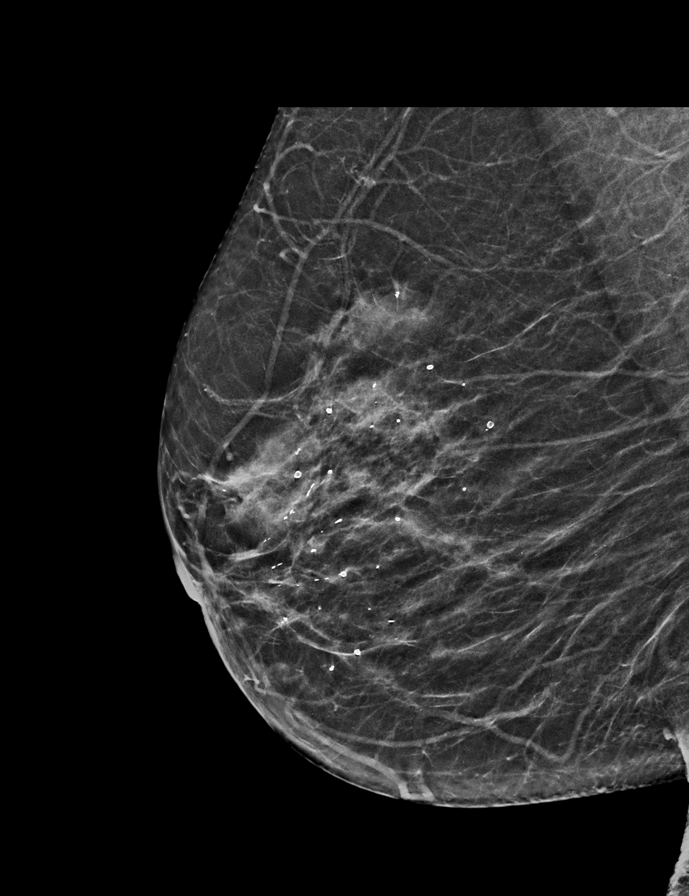

[L CC synth-2D]
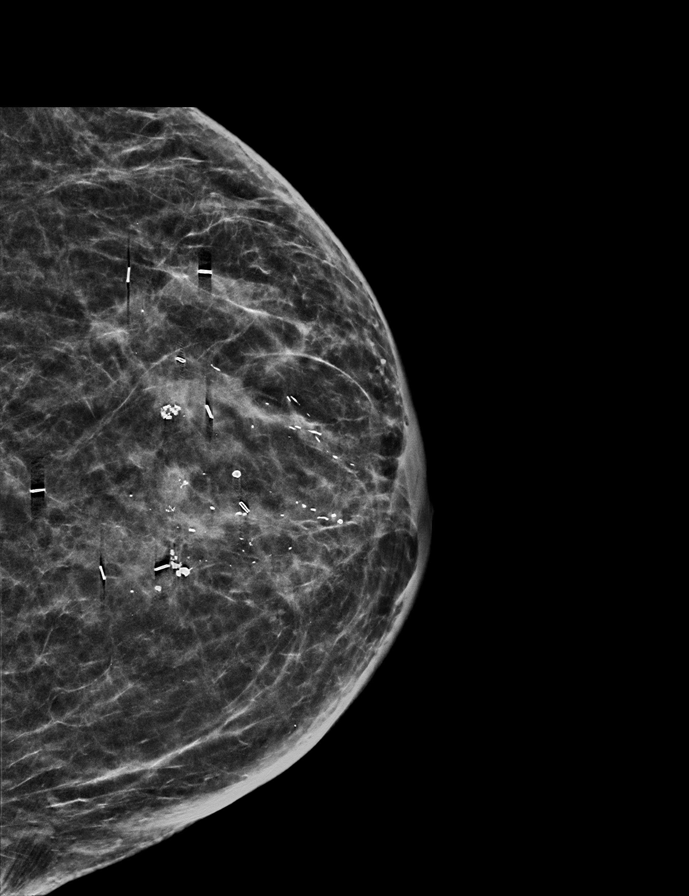

[L MLO synth-2D]
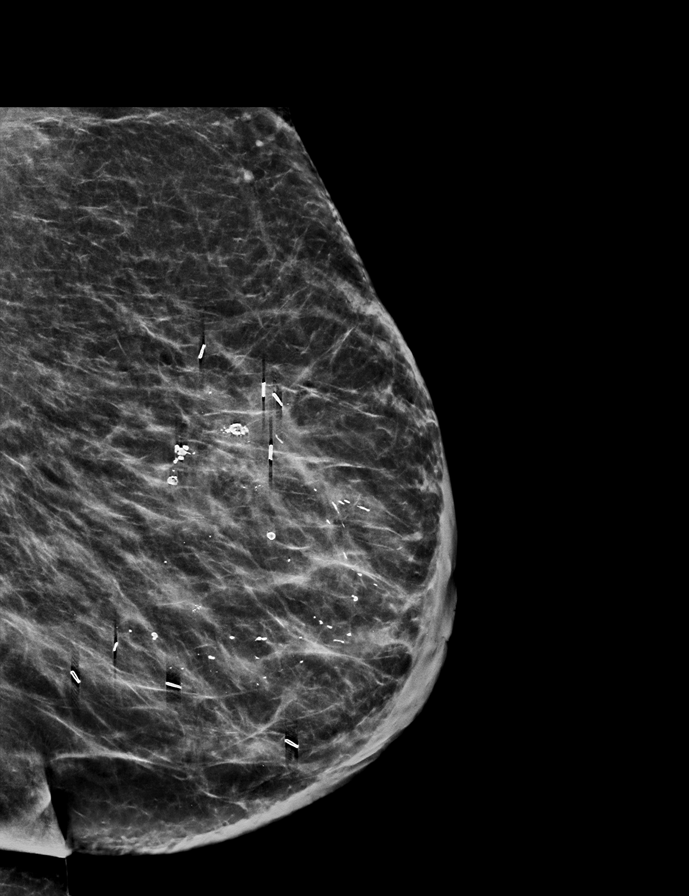

[R CC synth-2D]
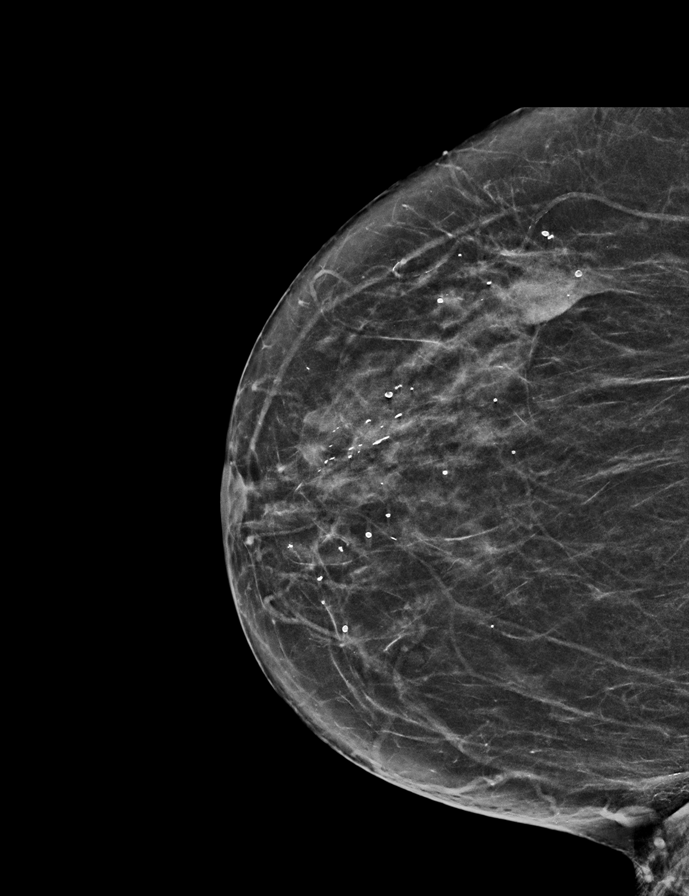

[R MLO tomo · tomo slice 27/53.0]
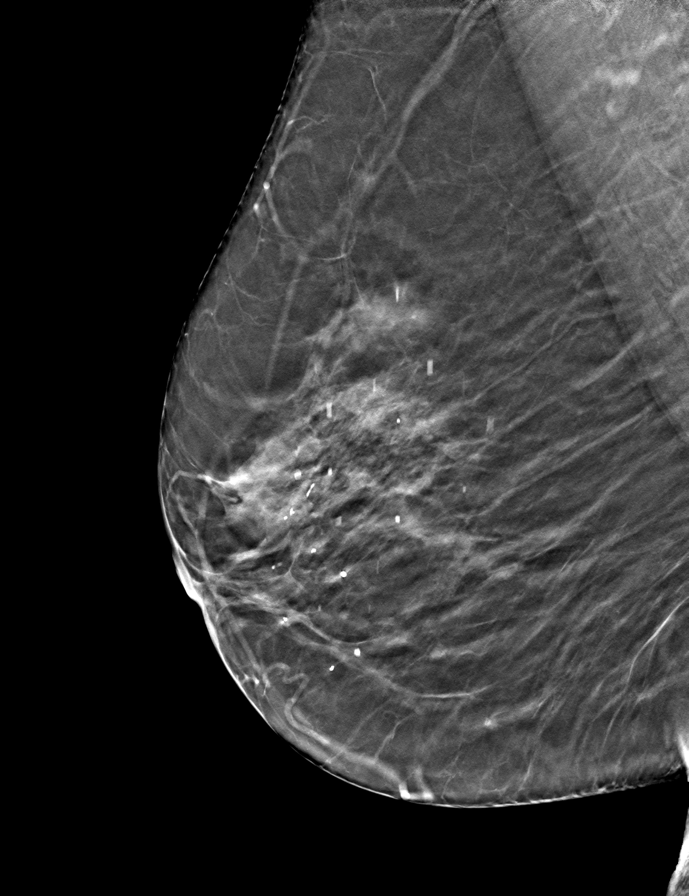

[R CC tomo · tomo slice 27/53.0]
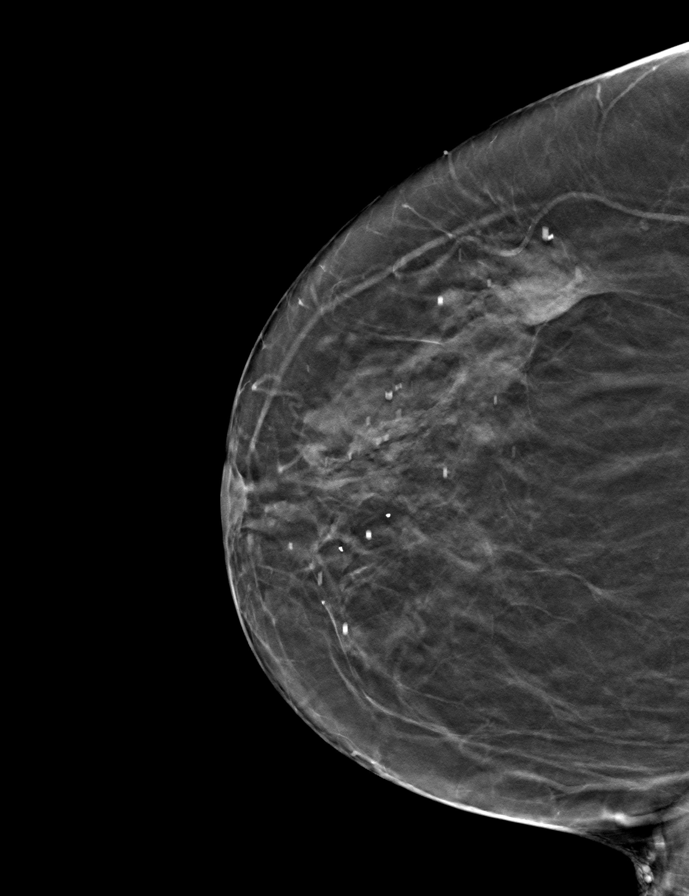

[L MLO tomo · tomo slice 33/66.0]
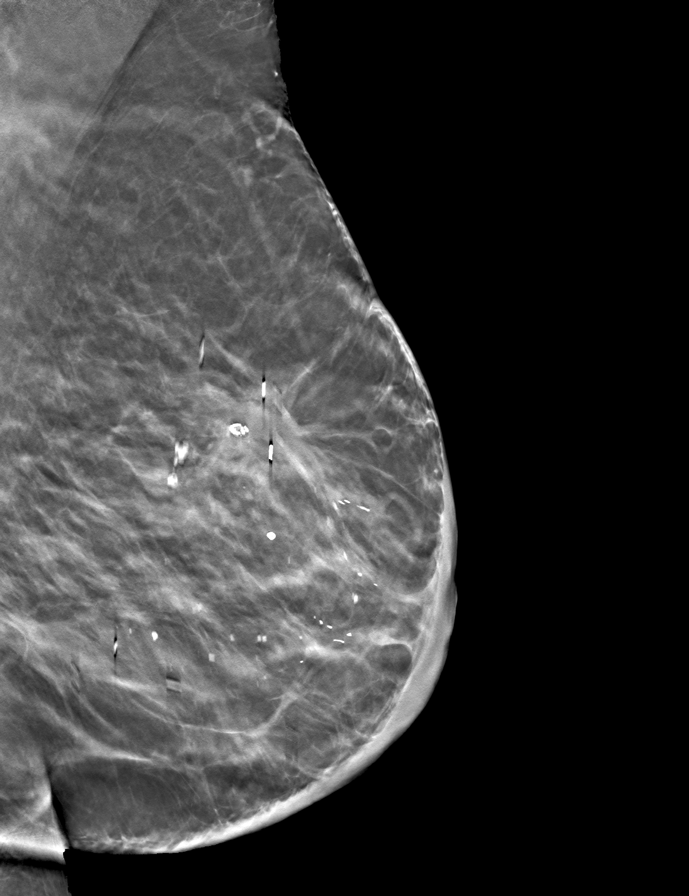

[L CC tomo · tomo slice 32/63.0]
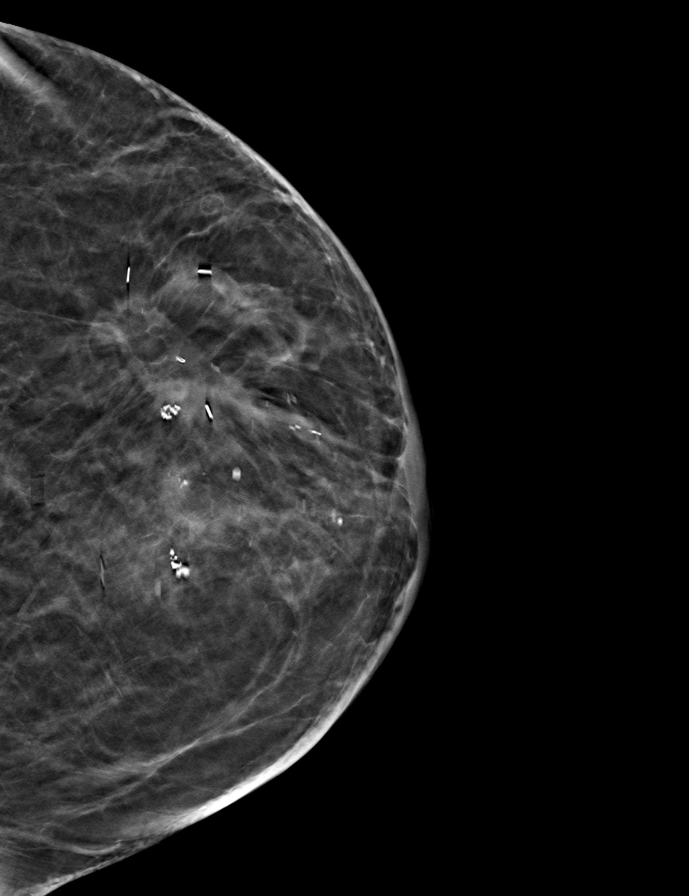

[9 of 25 positions shown; findings below may reference images not displayed]

ACR Breast Density Category c: The breast tissue is heterogeneously
dense, which may obscure small masses.
FINDINGS: 2D and 3D full field views of both breasts and a magnification view
of the lumpectomy sites demonstrate no suspicious mass, nonsurgical
distortion or worrisome calcifications.

LEFT lumpectomy changes are noted.

Mammographic images were processed with CAD.
IMPRESSION: No evidence of breast malignancy.

RECOMMENDATION:
Bilateral diagnostic mammogram in 1 year.

I have discussed the findings and recommendations with the patient.
Results were also provided in writing at the conclusion of the
visit. If applicable, a reminder letter will be sent to the patient
regarding the next appointment.

BI-RADS CATEGORY  2: Benign.

## 2020-12-20 DIAGNOSIS — C50912 Malignant neoplasm of unspecified site of left female breast: Secondary | ICD-10-CM | POA: Diagnosis not present

## 2020-12-26 ENCOUNTER — Ambulatory Visit
Admission: RE | Admit: 2020-12-26 | Discharge: 2020-12-26 | Disposition: A | Discharge status: Home / Self Care | Payer: Medicare Other | Source: Ambulatory Visit | Attending: Gastroenterology | Admitting: Gastroenterology

## 2020-12-26 ENCOUNTER — Other Ambulatory Visit: Payer: Self-pay | Admitting: Gastroenterology

## 2020-12-26 ENCOUNTER — Other Ambulatory Visit: Payer: Self-pay

## 2020-12-26 DIAGNOSIS — K449 Diaphragmatic hernia without obstruction or gangrene: Secondary | ICD-10-CM

## 2020-12-26 DIAGNOSIS — R1013 Epigastric pain: Secondary | ICD-10-CM

## 2021-01-09 DIAGNOSIS — K449 Diaphragmatic hernia without obstruction or gangrene: Secondary | ICD-10-CM | POA: Diagnosis not present

## 2021-01-11 ENCOUNTER — Inpatient Hospital Stay: Payer: Medicare Other | Attending: Hematology | Admitting: Hematology

## 2021-01-11 ENCOUNTER — Inpatient Hospital Stay: Payer: Medicare Other

## 2021-01-11 ENCOUNTER — Telehealth: Payer: Self-pay | Admitting: Hematology

## 2021-01-11 ENCOUNTER — Encounter: Payer: Self-pay | Admitting: Hematology

## 2021-01-11 ENCOUNTER — Other Ambulatory Visit: Payer: Self-pay

## 2021-01-11 VITALS — BP 155/74 | HR 82 | Temp 97.4°F | Resp 18 | Wt 139.4 lb

## 2021-01-11 DIAGNOSIS — C50412 Malignant neoplasm of upper-outer quadrant of left female breast: Secondary | ICD-10-CM | POA: Diagnosis not present

## 2021-01-11 DIAGNOSIS — Z9049 Acquired absence of other specified parts of digestive tract: Secondary | ICD-10-CM | POA: Insufficient documentation

## 2021-01-11 DIAGNOSIS — I10 Essential (primary) hypertension: Secondary | ICD-10-CM | POA: Insufficient documentation

## 2021-01-11 DIAGNOSIS — Z79811 Long term (current) use of aromatase inhibitors: Secondary | ICD-10-CM | POA: Diagnosis not present

## 2021-01-11 DIAGNOSIS — Z87891 Personal history of nicotine dependence: Secondary | ICD-10-CM | POA: Insufficient documentation

## 2021-01-11 DIAGNOSIS — Z923 Personal history of irradiation: Secondary | ICD-10-CM | POA: Diagnosis not present

## 2021-01-11 DIAGNOSIS — Z170 Estrogen receptor positive status [ER+]: Secondary | ICD-10-CM | POA: Insufficient documentation

## 2021-01-11 DIAGNOSIS — R079 Chest pain, unspecified: Secondary | ICD-10-CM | POA: Insufficient documentation

## 2021-01-11 DIAGNOSIS — Z79899 Other long term (current) drug therapy: Secondary | ICD-10-CM | POA: Insufficient documentation

## 2021-01-11 DIAGNOSIS — C773 Secondary and unspecified malignant neoplasm of axilla and upper limb lymph nodes: Secondary | ICD-10-CM | POA: Insufficient documentation

## 2021-01-11 DIAGNOSIS — Z8719 Personal history of other diseases of the digestive system: Secondary | ICD-10-CM | POA: Insufficient documentation

## 2021-01-11 DIAGNOSIS — K449 Diaphragmatic hernia without obstruction or gangrene: Secondary | ICD-10-CM | POA: Insufficient documentation

## 2021-01-11 LAB — CBC WITH DIFFERENTIAL (CANCER CENTER ONLY)
Abs Immature Granulocytes: 0.03 10*3/uL (ref 0.00–0.07)
Basophils Absolute: 0.1 10*3/uL (ref 0.0–0.1)
Basophils Relative: 1 %
Eosinophils Absolute: 0.3 10*3/uL (ref 0.0–0.5)
Eosinophils Relative: 3 %
HCT: 45.4 % (ref 36.0–46.0)
Hemoglobin: 14.7 g/dL (ref 12.0–15.0)
Immature Granulocytes: 0 %
Lymphocytes Relative: 13 %
Lymphs Abs: 1 10*3/uL (ref 0.7–4.0)
MCH: 29.2 pg (ref 26.0–34.0)
MCHC: 32.4 g/dL (ref 30.0–36.0)
MCV: 90.3 fL (ref 80.0–100.0)
Monocytes Absolute: 0.6 10*3/uL (ref 0.1–1.0)
Monocytes Relative: 8 %
Neutro Abs: 5.7 10*3/uL (ref 1.7–7.7)
Neutrophils Relative %: 75 %
Platelet Count: 243 10*3/uL (ref 150–400)
RBC: 5.03 MIL/uL (ref 3.87–5.11)
RDW: 13.4 % (ref 11.5–15.5)
WBC Count: 7.6 10*3/uL (ref 4.0–10.5)
nRBC: 0 % (ref 0.0–0.2)

## 2021-01-11 LAB — CMP (CANCER CENTER ONLY)
ALT: 18 U/L (ref 0–44)
AST: 17 U/L (ref 15–41)
Albumin: 3.7 g/dL (ref 3.5–5.0)
Alkaline Phosphatase: 96 U/L (ref 38–126)
Anion gap: 8 (ref 5–15)
BUN: 13 mg/dL (ref 8–23)
CO2: 31 mmol/L (ref 22–32)
Calcium: 9.6 mg/dL (ref 8.9–10.3)
Chloride: 104 mmol/L (ref 98–111)
Creatinine: 0.76 mg/dL (ref 0.44–1.00)
GFR, Estimated: >60 mL/min (ref >60)
Glucose, Bld: 111 mg/dL — ABNORMAL HIGH (ref 70–99)
Potassium: 4.4 mmol/L (ref 3.5–5.1)
Sodium: 143 mmol/L (ref 135–145)
Total Bilirubin: 0.5 mg/dL (ref 0.3–1.2)
Total Protein: 7.1 g/dL (ref 6.5–8.1)

## 2021-01-11 NOTE — Progress Notes (Signed)
[facility]   [phone] [phone]   Clinic Follow up Note   Patient Care Team: Sharilyn Sites, MD as PCP - General (Family Medicine) Gala Romney, Cristopher Estimable, MD as Consulting Physician (Gastroenterology) Alphonsa Overall, MD as Consulting Physician (General Surgery) Eppie Gibson, MD as Attending Physician (Radiation Oncology) Truitt Merle, MD as Consulting Physician (Hematology) Mauro Kaufmann, RN as Oncology Nurse Navigator Rockwell Germany, RN as Oncology Nurse Navigator Alla Feeling, NP as Nurse Practitioner (Nurse Practitioner)  Date of Service:  01/11/2021  CHIEF COMPLAINT: f/u of left breast cancer  SUMMARY OF ONCOLOGIC HISTORY: Oncology History Overview Note  Cancer Staging Carcinoma of upper-outer quadrant of left breast in female, estrogen receptor positive ([place]) Staging form: Breast, AJCC 8th Edition - Clinical stage from 05/05/2019: Stage IB (cT1c, cN1, cM0, G2, ER+, PR+, HER2-) - Signed by Eppie Gibson, MD on 05/05/2019 - Pathologic stage from 06/17/2019: Stage IA (pT1b, pN1a, cM0, G2, ER+, PR+, HER2-) - Signed by Truitt Merle, MD on 09/10/2019    Carcinoma of upper-outer quadrant of left breast in female, estrogen receptor positive ([place])  04/06/2019 Mammogram   Diagnostic mammogram 04/06/19  IMPRESSION: 1. 2 x 1.6 x 1.9cm irregular breast mass with associated architectural distortion in the 2 o'clock position of the left breast, 6 cm from the nipple corresponding to the palpable abnormality. This is highly suspicious for breast malignancy. 2. Small probably benign mass with central calcifications in the 7 o'clock position of the breast, measuring 7x4x7 mm. This likely corresponds to a focal opacity with calcifications seen mammographically that has been stable. 3. Single abnormal left axillary lymph node with a cortical thickness of 6 mm.   04/13/2019 Initial Biopsy   Diagnosis 04/13/19 1. Breast, left, needle core biopsy, upper outer quadrant  at 2:00 - INVASIVE MAMMARY CARCINOMA, GRADE II. 2. Lymph node, needle/core biopsy, left axilla - METASTATIC CARCINOMA.   04/13/2019 Receptors her2   Results: GROUP 5: HER2 **NEGATIVE** Estrogen Receptor: 100%, POSITIVE, STRONG STAINING INTENSITY Progesterone Receptor: 5%, POSITIVE, STRONG STAINING INTENSITY Proliferation Marker Ki67: 15%   05/05/2019 Initial Diagnosis   Carcinoma of upper-outer quadrant of left breast in female, estrogen receptor positive ([place])   05/05/2019 Cancer Staging   Staging form: Breast, AJCC 8th Edition - Clinical stage from 05/05/2019: Stage IB (cT1c, cN1, cM0, G2, ER+, PR+, HER2-) - Signed by Eppie Gibson, MD on 05/05/2019   05/18/2019 Pathology Results   FINAL MICROSCOPIC DIAGNOSIS: 05/18/19 A. BREAST, LEFT, BIOPSY:  - Invasive ductal carcinoma, grade 2.  See comment  - Ductal carcinoma in situ, intermediate grade    06/17/2019 Surgery   LEFT BREAST LUMPECTOMY X2 WITH RADIOACTIVE SEED X2 AND LEFT SEED TARGETED AXILLARY LYMPH NODE EXCISION AND SENTINEL LYMPH NODE EXCISION  by Dr Lucia Gaskins 06/17/19   06/17/2019 Pathology Results   FINAL MICROSCOPIC DIAGNOSIS: 06/17/19  A. BREAST, LEFT, 7 O'CLOCK, LUMPECTOMY:  - Invasive ductal carcinoma, 0.7 cm, Nottingham grade 2 of 3.  - Margins of resection are not involved (Closest margin: Less than 1 mm,  Superior/medial).  - See oncology table.   B. BREAST, LEFT, 2 O'CLOCK, LUMPECTOMY:  - Invasive ductal carcinoma, 2.8 cm, Nottingham grade 2 of 3.  - Margins of resection are not involved (Closest margin: Less than 1 mm,  Posterior).  - Ductal carcinoma in situ.  - See oncology table.   C. BREAST, LEFT, ADDITIONAL INFERIOR MARGIN, 7 O'CLOCK, EXCISION:  - Breast tissue, negative for carcinoma.   D. SENTINEL LYMPH NODE, LEFT AXILLARY,  BIOPSY:  - Metastatic carcinoma present in one of one lymph node (1/1).  - Biopsy site changes.   E. SENTINEL LYMPH NODE, LEFT AXILLARY, BIOPSY:  - Metastatic carcinoma present in  one of one lymph node (1/1).   F. SENTINEL LYMPH NODE, LEFT AXILLARY, BIOPSY:  - One lymph node, negative for carcinoma (0/1).      06/17/2019 Miscellaneous   Mammaprint  Low Risk Luminal Type A with 10% risk of recurrence in 10 years.  Index +0.436 97.8% benefit of hormonal therapy.    06/17/2019 Cancer Staging   Staging form: Breast, AJCC 8th Edition - Pathologic stage from 06/17/2019: Stage IA (pT1b, pN1a, cM0, G2, ER+, PR+, HER2-) - Signed by Truitt Merle, MD on 09/10/2019   07/21/2019 - 09/02/2019 Radiation Therapy   Adjuvant radiation with Dr Isidore Moos 07/21/19-09/02/19   09/2019 -  Anti-estrogen oral therapy   Anastrozole 41m daily starting 09/2019    12/09/2019 Survivorship   Virtual - SCP delivered by LCira Rue NP       CURRENT THERAPY:  Anastrozole 1 mg daily starting 09/2019  INTERVAL HISTORY:  Courtney BRUNKEis here for a follow up of breast cancer. She was last seen by me on 07/14/2020. She presents to the clinic alone.  She notes she developed another hiatal hernia. She denies heartburn but notes a pain in the middle of her chest. She notes she was put on pantoprazole for reflux. She will see Dr. MHassell Donein 01/2021 to discuss surgical options. She continues on anastrozole with good tolerance. She denies hot flashes or joint pains.   All other systems were reviewed with the patient and are negative.  MEDICAL HISTORY:  Past Medical History:  Diagnosis Date  . Anemia   . Diabetes mellitus without complication ([place]    "BORDERLINE", off all medication  . Diverticulosis   . Heart murmur    childhood   . History of hiatal hernia   . History of radiation therapy 07/21/19- 09/02/19   Left Breast 25 fx of 2 Gy each to total 50 Gy. Left Breast boost 5 fx of 2 Gy each to total 10 Gy  . History of skin cancer   . History of transfusion   . Hypercholesteremia   . Hypertension   . Personal history of radiation therapy   . Skin cancer   . UTI (lower urinary tract  infection)    TAKING ANTIBIOTICS    SURGICAL HISTORY: Past Surgical History:  Procedure Laterality Date  . BREAST LUMPECTOMY Left   . BREAST LUMPECTOMY WITH RADIOACTIVE SEED AND AXILLARY LYMPH NODE DISSECTION Left 06/17/2019   Procedure: LEFT BREAST LUMPECTOMY X2  WITH RADIOACTIVE SEED X2 AND LEFT SEED TARGETED AXILLARY LYMPH NODE EXCISION AND SENTINEL LYMPH NODE EXCISION;  Surgeon: NAlphonsa Overall MD;  [facility]  Service: General;  Laterality: Left;  . CHOLECYSTECTOMY    . COLONOSCOPY  2009   Dr. MAviva Signs . COLONOSCOPY N/A 01/01/2013   SXHB:ZJIRCVmucosa in the terminal ileum  Mild diverticulosis in the descending colon and sigmoid colon and sigmoid colon/Moderate sized internal hemorrhoids  . ESOPHAGOGASTRODUODENOSCOPY N/A 01/01/2013   SELF:YBOFBhiatal hernia/ MODERATE SIZE PARA-ESOPHAGEAL HERNIA/MILD Erosive gastritis. chronic duodenitis c/w peptic duodenitis. no h.pylori or villous atrophy. minimal chronic gastritis.   .Marland KitchenESOPHAGOGASTRODUODENOSCOPY N/A 02/16/2013   Procedure: ESOPHAGOGASTRODUODENOSCOPY (EGD);  Surgeon: RDaneil Dolin MD;  [facility];  Service: Endoscopy;  Laterality: N/A;  8:30  . GIVENS CAPSULE STUDY N/A 02/16/2013  EGD with capsule placement. no evidence of paraesophageal hernia. SB essentially unremarkable.  Marland Kitchen HIATAL HERNIA REPAIR N/A 03/21/2017   Procedure: LAPAROSCOPIC lysis of adhesions,reduction hiatal hernia, upper endoscopy,gastropexy;  Surgeon: Johnathan Hausen, MD;  Location: [facility];  Service: General;  Laterality: N/A;  . KNOT REMOVED FROM SCALP    . LAPAROSCOPIC NISSEN FUNDOPLICATION N/A 12/04/345   Procedure: LAPAROSCOPIC NISSEN FUNDOPLICATION AND HIATAL HERNIA REPAIR;  Surgeon: Johnathan Hausen, MD;  Location: [facility];  Service: General;  Laterality: N/A;    I have reviewed the social history and family history with the patient and they are unchanged from previous note.  ALLERGIES:  has No Known  Allergies.  MEDICATIONS:  Current Outpatient Medications  Medication Sig Dispense Refill  . pantoprazole (PROTONIX) 40 MG tablet Take 40 mg by mouth daily.    Marland Kitchen acetaminophen (TYLENOL) 500 MG tablet Take 1,000 mg by mouth daily as needed for headache.    . anastrozole (ARIMIDEX) 1 MG tablet Take 1 tablet (1 mg total) by mouth daily. 90 tablet 3  . aspirin 81 MG tablet Take 81 mg by mouth every morning.     Marland Kitchen atorvastatin (LIPITOR) 80 MG tablet Take 40 mg by mouth every evening.    . cimetidine (TAGAMET) 200 MG tablet Take 200 mg by mouth 2 (two) times daily.    . enalapril (VASOTEC) 10 MG tablet Take 10 mg by mouth daily.     . ondansetron (ZOFRAN-ODT) 4 MG disintegrating tablet Take 1 tablet (4 mg total) by mouth every 6 (six) hours as needed for nausea. 20 tablet 0  . Simethicone (GAS-X PO) Take 1-2 tablets by mouth 2 (two) times daily as needed (stomach irritation).     No current facility-administered medications for this visit.    PHYSICAL EXAMINATION: ECOG PERFORMANCE STATUS: 1 - Symptomatic but completely ambulatory  Vitals:   01/11/21 0955  BP: (!) 155/74  Pulse: 82  Resp: 18  Temp: (!) 97.4 F (36.3 C)  SpO2: 97%   Filed Weights   01/11/21 0955  Weight: 139 lb 6.4 oz (63.2 kg)    GENERAL:alert, no distress and comfortable SKIN: skin color, texture, turgor are normal, no rashes or significant lesions EYES: normal, Conjunctiva are pink and non-injected, sclera clear  NECK: supple, thyroid normal size, non-tender, without nodularity LYMPH:  no palpable lymphadenopathy in the cervical, axillary LUNGS: clear to auscultation and percussion with normal breathing effort HEART: regular rate & rhythm and no murmurs and no lower extremity edema ABDOMEN:abdomen soft, non-tender and normal bowel sounds Musculoskeletal:no cyanosis of digits and no clubbing  NEURO: alert & oriented x 3 with fluent speech, no focal motor/sensory deficits BREASTS: incision healing  well.  LABORATORY DATA:  I have reviewed the data as listed CBC Latest Ref Rng & Units 01/11/2021 07/14/2020 03/09/2020  WBC 4.0 - 10.5 K/uL 7.6 10.4 7.9  Hemoglobin 12.0 - 15.0 g/dL 14.7 15.4(H) 15.0  Hematocrit 36.0 - 46.0 % 45.4 47.8(H) 46.8(H)  Platelets 150 - 400 K/uL 243 246 218     CMP Latest Ref Rng & Units 01/11/2021 07/14/2020 03/09/2020  Glucose 70 - 99 mg/dL 111(H) 125(H) 96  BUN 8 - 23 mg/dL '13 14 13  ' Creatinine 0.44 - 1.00 mg/dL 0.76 0.81 0.85  Sodium 135 - 145 mmol/L 143 141 143  Potassium 3.5 - 5.1 mmol/L 4.4 4.2 4.3  Chloride 98 - 111 mmol/L 104 104 105  CO2 22 - 32 mmol/L '31 29 27  ' Calcium 8.9 - 10.3 mg/dL 9.6 9.3  9.6  Total Protein 6.5 - 8.1 g/dL 7.1 7.3 7.1  Total Bilirubin 0.3 - 1.2 mg/dL 0.5 0.6 0.5  Alkaline Phos 38 - 126 U/L 96 88 90  AST 15 - 41 U/L '17 20 19  ' ALT 0 - 44 U/L '18 24 23      ' RADIOGRAPHIC STUDIES: I have personally reviewed the radiological images as listed and agreed with the findings in the report. No results found.   ASSESSMENT & PLAN:  ANJENETTE GERBINO is a 75 y.o. 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 Path showedcomplete surgical resection with 2/3 positive lymph nodes, negative margins. Her mammaprint returned low risk. Adjuvant chemo was not recommended.She completed adjuvant RT.  -She did not proceed with genetic testing.  -I started her on antiestrogen therapy with Anastrozole in 09/2019.She is tolerating well. -She is clinically doing well. Her physical exam and her 04/2020 mammogram were unremarkable. There is no clinical concern for recurrence. -Continue surveillance. She is due to mammogram in 04/2021 -Continue Anastrozole.  -F/u in 6 months   2. HTN -On medications, but uncontrolled. Continue to f/u with  PCP   3. Smoking Cessation  -She has a long h/o smoking 1ppd for at least 25 years. She has tried to quit and has been able to reduce use.   4. Bone Health  -I discussed AI such as Anastrozole can weaken her bone. Will monitor with DEXA scan every 2 years. -Her latest DEXA from 10/2019 was normal with lowest T-score 0.3.   PLAN: -Continue Anastrozole  -I will order mammogram for 04/2021 at [facility]. -Lab and F/u in 6 months    No problem-specific Assessment & Plan notes found for this encounter.   Orders Placed This Encounter  Procedures  . MM DIAG BREAST TOMO BILATERAL    Standing Status:   Future    Standing Expiration Date:   01/11/2022    Order Specific Question:   Reason for Exam (SYMPTOM  OR DIAGNOSIS REQUIRED)    Answer:   screening    Order Specific Question:   Preferred imaging location?    Answer:   [facility]   All questions were answered. The patient knows to call the clinic with any problems, questions or concerns. No barriers to learning was detected. The total time spent in the appointment was 25 minutes.     Truitt Merle, MD 01/11/2021   I, Wilburn Mylar, am acting as scribe for Truitt Merle, MD.   I have reviewed the above documentation for accuracy and completeness, and I agree with the above.

## 2021-01-11 NOTE — Telephone Encounter (Signed)
Scheduled follow-up appointment per 5/12 los. Patient is aware. 

## 2021-01-16 DIAGNOSIS — E119 Type 2 diabetes mellitus without complications: Secondary | ICD-10-CM | POA: Diagnosis not present

## 2021-01-16 DIAGNOSIS — Z961 Presence of intraocular lens: Secondary | ICD-10-CM | POA: Diagnosis not present

## 2021-01-16 DIAGNOSIS — H43811 Vitreous degeneration, right eye: Secondary | ICD-10-CM | POA: Diagnosis not present

## 2021-01-16 DIAGNOSIS — H17823 Peripheral opacity of cornea, bilateral: Secondary | ICD-10-CM | POA: Diagnosis not present

## 2021-01-16 DIAGNOSIS — H35372 Puckering of macula, left eye: Secondary | ICD-10-CM | POA: Diagnosis not present

## 2021-03-01 ENCOUNTER — Other Ambulatory Visit (HOSPITAL_COMMUNITY): Payer: Self-pay | Admitting: Hematology

## 2021-03-01 DIAGNOSIS — Z170 Estrogen receptor positive status [ER+]: Secondary | ICD-10-CM

## 2021-03-08 DIAGNOSIS — L820 Inflamed seborrheic keratosis: Secondary | ICD-10-CM | POA: Diagnosis not present

## 2021-03-08 DIAGNOSIS — L821 Other seborrheic keratosis: Secondary | ICD-10-CM | POA: Diagnosis not present

## 2021-04-17 ENCOUNTER — Other Ambulatory Visit: Payer: Self-pay

## 2021-04-17 ENCOUNTER — Ambulatory Visit (HOSPITAL_COMMUNITY): Payer: Medicare Other

## 2021-04-17 ENCOUNTER — Ambulatory Visit (HOSPITAL_COMMUNITY)
Admission: RE | Admit: 2021-04-17 | Discharge: 2021-04-17 | Disposition: A | Discharge status: Home / Self Care | Payer: Medicare Other | Source: Ambulatory Visit | Attending: Hematology | Admitting: Hematology

## 2021-04-17 DIAGNOSIS — C50412 Malignant neoplasm of upper-outer quadrant of left female breast: Secondary | ICD-10-CM | POA: Diagnosis not present

## 2021-04-17 DIAGNOSIS — Z170 Estrogen receptor positive status [ER+]: Secondary | ICD-10-CM | POA: Insufficient documentation

## 2021-04-17 DIAGNOSIS — R922 Inconclusive mammogram: Secondary | ICD-10-CM | POA: Diagnosis not present

## 2021-05-16 ENCOUNTER — Telehealth: Payer: Self-pay | Admitting: Hematology

## 2021-05-16 NOTE — Telephone Encounter (Signed)
Left message with rescheduled upcoming appointment due to provider's breast clinic. 

## 2021-05-18 DIAGNOSIS — K449 Diaphragmatic hernia without obstruction or gangrene: Secondary | ICD-10-CM | POA: Diagnosis not present

## 2021-05-24 DIAGNOSIS — Z23 Encounter for immunization: Secondary | ICD-10-CM | POA: Diagnosis not present

## 2021-07-11 ENCOUNTER — Ambulatory Visit: Payer: Medicare Other | Admitting: Hematology

## 2021-07-11 ENCOUNTER — Other Ambulatory Visit: Payer: Medicare Other

## 2021-07-11 ENCOUNTER — Other Ambulatory Visit: Payer: Self-pay

## 2021-07-11 DIAGNOSIS — C50412 Malignant neoplasm of upper-outer quadrant of left female breast: Secondary | ICD-10-CM

## 2021-07-11 NOTE — Progress Notes (Signed)
CBC w/Diff and CMP ordered

## 2021-07-12 ENCOUNTER — Other Ambulatory Visit: Payer: Self-pay

## 2021-07-12 ENCOUNTER — Inpatient Hospital Stay: Payer: Medicare Other | Attending: Hematology

## 2021-07-12 ENCOUNTER — Inpatient Hospital Stay (HOSPITAL_BASED_OUTPATIENT_CLINIC_OR_DEPARTMENT_OTHER): Payer: Medicare Other | Admitting: Hematology

## 2021-07-12 VITALS — BP 159/72 | HR 88 | Temp 98.2°F | Resp 17 | Ht 61.0 in | Wt 136.4 lb

## 2021-07-12 DIAGNOSIS — I10 Essential (primary) hypertension: Secondary | ICD-10-CM | POA: Insufficient documentation

## 2021-07-12 DIAGNOSIS — Z8719 Personal history of other diseases of the digestive system: Secondary | ICD-10-CM | POA: Diagnosis not present

## 2021-07-12 DIAGNOSIS — Z87440 Personal history of urinary (tract) infections: Secondary | ICD-10-CM | POA: Diagnosis not present

## 2021-07-12 DIAGNOSIS — Z170 Estrogen receptor positive status [ER+]: Secondary | ICD-10-CM

## 2021-07-12 DIAGNOSIS — C778 Secondary and unspecified malignant neoplasm of lymph nodes of multiple regions: Secondary | ICD-10-CM | POA: Diagnosis not present

## 2021-07-12 DIAGNOSIS — Z85828 Personal history of other malignant neoplasm of skin: Secondary | ICD-10-CM | POA: Diagnosis not present

## 2021-07-12 DIAGNOSIS — Z923 Personal history of irradiation: Secondary | ICD-10-CM | POA: Diagnosis not present

## 2021-07-12 DIAGNOSIS — Z87891 Personal history of nicotine dependence: Secondary | ICD-10-CM | POA: Diagnosis not present

## 2021-07-12 DIAGNOSIS — M549 Dorsalgia, unspecified: Secondary | ICD-10-CM | POA: Insufficient documentation

## 2021-07-12 DIAGNOSIS — C50412 Malignant neoplasm of upper-outer quadrant of left female breast: Secondary | ICD-10-CM

## 2021-07-12 DIAGNOSIS — Z79811 Long term (current) use of aromatase inhibitors: Secondary | ICD-10-CM | POA: Diagnosis not present

## 2021-07-12 DIAGNOSIS — Z9049 Acquired absence of other specified parts of digestive tract: Secondary | ICD-10-CM | POA: Insufficient documentation

## 2021-07-12 DIAGNOSIS — G8929 Other chronic pain: Secondary | ICD-10-CM | POA: Diagnosis not present

## 2021-07-12 LAB — CBC WITH DIFFERENTIAL (CANCER CENTER ONLY)
Abs Immature Granulocytes: 0.04 10*3/uL (ref 0.00–0.07)
Basophils Absolute: 0.1 10*3/uL (ref 0.0–0.1)
Basophils Relative: 1 %
Eosinophils Absolute: 0.2 10*3/uL (ref 0.0–0.5)
Eosinophils Relative: 1 %
HCT: 47.9 % — ABNORMAL HIGH (ref 36.0–46.0)
Hemoglobin: 15.5 g/dL — ABNORMAL HIGH (ref 12.0–15.0)
Immature Granulocytes: 0 %
Lymphocytes Relative: 8 %
Lymphs Abs: 1 10*3/uL (ref 0.7–4.0)
MCH: 28.6 pg (ref 26.0–34.0)
MCHC: 32.4 g/dL (ref 30.0–36.0)
MCV: 88.4 fL (ref 80.0–100.0)
Monocytes Absolute: 1 10*3/uL (ref 0.1–1.0)
Monocytes Relative: 8 %
Neutro Abs: 9.4 10*3/uL — ABNORMAL HIGH (ref 1.7–7.7)
Neutrophils Relative %: 82 %
Platelet Count: 205 10*3/uL (ref 150–400)
RBC: 5.42 MIL/uL — ABNORMAL HIGH (ref 3.87–5.11)
RDW: 14.9 % (ref 11.5–15.5)
WBC Count: 11.6 10*3/uL — ABNORMAL HIGH (ref 4.0–10.5)
nRBC: 0 % (ref 0.0–0.2)

## 2021-07-12 LAB — CMP (CANCER CENTER ONLY)
ALT: 19 U/L (ref 0–44)
AST: 19 U/L (ref 15–41)
Albumin: 4 g/dL (ref 3.5–5.0)
Alkaline Phosphatase: 109 U/L (ref 38–126)
Anion gap: 9 (ref 5–15)
BUN: 14 mg/dL (ref 8–23)
CO2: 26 mmol/L (ref 22–32)
Calcium: 9.1 mg/dL (ref 8.9–10.3)
Chloride: 106 mmol/L (ref 98–111)
Creatinine: 0.8 mg/dL (ref 0.44–1.00)
GFR, Estimated: >60 mL/min (ref >60)
Glucose, Bld: 115 mg/dL — ABNORMAL HIGH (ref 70–99)
Potassium: 4.2 mmol/L (ref 3.5–5.1)
Sodium: 141 mmol/L (ref 135–145)
Total Bilirubin: 0.5 mg/dL (ref 0.3–1.2)
Total Protein: 7.2 g/dL (ref 6.5–8.1)

## 2021-07-12 MED ORDER — ANASTROZOLE 1 MG PO TABS: 1.0000 mg | ORAL_TABLET | Freq: Every day | ORAL | 3 refills | Status: DC

## 2021-07-12 NOTE — Progress Notes (Signed)
[place]   [phone] [phone]   Clinic Follow up Note   Patient Care Team: Sharilyn Sites, MD as PCP - General (Family Medicine) Gala Romney, Cristopher Estimable, MD as Consulting Physician (Gastroenterology) Alphonsa Overall, MD as Consulting Physician (General Surgery) Eppie Gibson, MD as Attending Physician (Radiation Oncology) Truitt Merle, MD as Consulting Physician (Hematology) Mauro Kaufmann, RN as Oncology Nurse Navigator Rockwell Germany, RN as Oncology Nurse Navigator Alla Feeling, NP as Nurse Practitioner (Nurse Practitioner)  Date of Service:  07/12/2021  CHIEF COMPLAINT: f/u of left breast cancer  CURRENT THERAPY:  Anastrozole 1 mg daily starting 09/2019  ASSESSMENT & PLAN:  Courtney Dorsey is a 75 y.o. female with   1. 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 She underwent left breast double lumpectomy and LN excision. Path showed complete surgical resection with 2/3 positive lymph nodes, negative margins. Her mammaprint returned low risk. Adjuvant chemo was not recommended. She completed adjuvant RT.  -She did not proceed with genetic testing.  -she started Anastrozole in 09/2019. She is tolerating well. -last MM 04/17/21 was benign. -She is clinically doing well. Her physical exam and her 04/2020 mammogram were unremarkable. There is no clinical concern for recurrence. -Continue surveillance. She is due to mammogram in 04/2022 -Continue Anastrozole. She is tolerating well  -F/u in 6 months    2. HTN -On medications, but uncontrolled. Continue to f/u with PCP    3. Smoking Cessation  -She has a long h/o smoking 1ppd for at least 25 years. She has tried to quit and has been able to reduce use.    4. Bone Health  -I discussed AI such as Anastrozole can weaken her bone. Will monitor with DEXA scan every 2  years.  -Her latest DEXA from 10/2019 was normal with lowest T-score +0.3.      PLAN:  -Continue Anastrozole  -Lab and F/u in 6 months    No problem-specific Assessment & Plan notes found for this encounter.   SUMMARY OF ONCOLOGIC HISTORY: Oncology History Overview Note  Cancer Staging Carcinoma of upper-outer quadrant of left breast in female, estrogen receptor positive ([place]) Staging form: Breast, AJCC 8th Edition - Clinical stage from 05/05/2019: Stage IB (cT1c, cN1, cM0, G2, ER+, PR+, HER2-) - Signed by Eppie Gibson, MD on 05/05/2019 - Pathologic stage from 06/17/2019: Stage IA (pT1b, pN1a, cM0, G2, ER+, PR+, HER2-) - Signed by Truitt Merle, MD on 09/10/2019    Carcinoma of upper-outer quadrant of left breast in female, estrogen receptor positive ([place])  04/06/2019 Mammogram   Diagnostic mammogram 04/06/19  IMPRESSION: 1. 2 x 1.6 x 1.9cm irregular breast mass with associated architectural distortion in the 2 o'clock position of the left breast, 6 cm from the nipple corresponding to the palpable abnormality. This is highly suspicious for breast malignancy. 2. Small probably benign mass with central calcifications in the 7 o'clock position of the breast, measuring 7x4x7 mm. This likely corresponds to a focal opacity with calcifications seen mammographically that has been stable. 3. Single abnormal left axillary lymph node with a cortical thickness of 6 mm.   04/13/2019 Initial Biopsy   Diagnosis 04/13/19 1. Breast, left, needle core biopsy, upper outer quadrant at 2:00 - INVASIVE MAMMARY CARCINOMA, GRADE II. 2. Lymph node, needle/core biopsy, left axilla - METASTATIC CARCINOMA.   04/13/2019 Receptors her2   Results: GROUP  5: HER2 **NEGATIVE** Estrogen Receptor: 100%, POSITIVE, STRONG STAINING INTENSITY Progesterone Receptor: 5%, POSITIVE, STRONG STAINING INTENSITY Proliferation Marker Ki67: 15%   05/05/2019 Initial Diagnosis   Carcinoma of upper-outer quadrant of left breast in  female, estrogen receptor positive ([place])   05/05/2019 Cancer Staging   Staging form: Breast, AJCC 8th Edition - Clinical stage from 05/05/2019: Stage IB (cT1c, cN1, cM0, G2, ER+, PR+, HER2-) - Signed by Eppie Gibson, MD on 05/05/2019    05/18/2019 Pathology Results   FINAL MICROSCOPIC DIAGNOSIS: 05/18/19 A. BREAST, LEFT, BIOPSY:  - Invasive ductal carcinoma, grade 2.  See comment  - Ductal carcinoma in situ, intermediate grade    06/17/2019 Surgery   LEFT BREAST LUMPECTOMY X2 WITH RADIOACTIVE SEED X2 AND LEFT SEED TARGETED AXILLARY LYMPH NODE EXCISION AND SENTINEL LYMPH NODE EXCISION  by Dr Lucia Gaskins 06/17/19   06/17/2019 Pathology Results   FINAL MICROSCOPIC DIAGNOSIS: 06/17/19  A. BREAST, LEFT, 7 O'CLOCK, LUMPECTOMY:  - Invasive ductal carcinoma, 0.7 cm, Nottingham grade 2 of 3.  - Margins of resection are not involved (Closest margin: Less than 1 mm,  Superior/medial).  - See oncology table.   B. BREAST, LEFT, 2 O'CLOCK, LUMPECTOMY:  - Invasive ductal carcinoma, 2.8 cm, Nottingham grade 2 of 3.  - Margins of resection are not involved (Closest margin: Less than 1 mm,  Posterior).  - Ductal carcinoma in situ.  - See oncology table.   C. BREAST, LEFT, ADDITIONAL INFERIOR MARGIN, 7 O'CLOCK, EXCISION:  - Breast tissue, negative for carcinoma.   D. SENTINEL LYMPH NODE, LEFT AXILLARY, BIOPSY:  - Metastatic carcinoma present in one of one lymph node (1/1).  - Biopsy site changes.   E. SENTINEL LYMPH NODE, LEFT AXILLARY, BIOPSY:  - Metastatic carcinoma present in one of one lymph node (1/1).   F. SENTINEL LYMPH NODE, LEFT AXILLARY, BIOPSY:  - One lymph node, negative for carcinoma (0/1).      06/17/2019 Miscellaneous   Mammaprint  Low Risk Luminal Type A with 10% risk of recurrence in 10 years.  Index +0.436 97.8% benefit of hormonal therapy.    06/17/2019 Cancer Staging   Staging form: Breast, AJCC 8th Edition - Pathologic stage from 06/17/2019: Stage IA (pT1b, pN1a, cM0,  G2, ER+, PR+, HER2-) - Signed by Truitt Merle, MD on 09/10/2019    07/21/2019 - 09/02/2019 Radiation Therapy   Adjuvant radiation with Dr Isidore Moos 07/21/19-09/02/19   09/2019 -  Anti-estrogen oral therapy   Anastrozole 16m daily starting 09/2019    12/09/2019 Survivorship   Virtual - SCP delivered by LCira Rue NP       INTERVAL HISTORY:  JCHARLISE GIOVANETTIis here for a follow up of breast cancer. She was last seen by me on 01/11/21. She presents to the clinic alone. She reports she is doing well overall. She notes some chronic back pain that is not very bothersome.   All other systems were reviewed with the patient and are negative.  MEDICAL HISTORY:  Past Medical History:  Diagnosis Date   Anemia    Diabetes mellitus without complication ([facility]    "BORDERLINE", off all medication   Diverticulosis    Heart murmur    childhood    History of hiatal hernia    History of radiation therapy 07/21/19- 09/02/19   Left Breast 25 fx of 2 Gy each to total 50 Gy. Left Breast boost 5 fx of 2 Gy each to total 10 Gy   History of skin cancer    History of  transfusion    Hypercholesteremia    Hypertension    Personal history of radiation therapy    Skin cancer    UTI (lower urinary tract infection)    TAKING ANTIBIOTICS    SURGICAL HISTORY: Past Surgical History:  Procedure Laterality Date   BREAST LUMPECTOMY Left    BREAST LUMPECTOMY WITH RADIOACTIVE SEED AND AXILLARY LYMPH NODE DISSECTION Left 06/17/2019   Procedure: LEFT BREAST LUMPECTOMY X2  WITH RADIOACTIVE SEED X2 AND LEFT SEED TARGETED AXILLARY LYMPH NODE EXCISION AND SENTINEL LYMPH NODE EXCISION;  Surgeon: Alphonsa Overall, MD;  Location: [facility];  Service: General;  Laterality: Left;   CHOLECYSTECTOMY     COLONOSCOPY  2009   Dr. Aviva Signs   COLONOSCOPY N/A 01/01/2013   RAQ:TMAUQJ mucosa in the terminal ileum  Mild diverticulosis in the descending colon and sigmoid colon and sigmoid colon/Moderate sized internal  hemorrhoids   ESOPHAGOGASTRODUODENOSCOPY N/A 01/01/2013   FHL:KTGYB hiatal hernia/ MODERATE SIZE PARA-ESOPHAGEAL HERNIA/MILD Erosive gastritis. chronic duodenitis c/w peptic duodenitis. no h.pylori or villous atrophy. minimal chronic gastritis.    ESOPHAGOGASTRODUODENOSCOPY N/A 02/16/2013   Procedure: ESOPHAGOGASTRODUODENOSCOPY (EGD);  Surgeon: Daneil Dolin, MD;  [facility];  Service: Endoscopy;  Laterality: N/A;  8:30   GIVENS CAPSULE STUDY N/A 02/16/2013   EGD with capsule placement. no evidence of paraesophageal hernia. SB essentially unremarkable.   HIATAL HERNIA REPAIR N/A 03/21/2017   Procedure: LAPAROSCOPIC lysis of adhesions,reduction hiatal hernia, upper endoscopy,gastropexy;  Surgeon: Johnathan Hausen, MD;  Location: [facility];  Service: General;  Laterality: N/A;   KNOT REMOVED FROM SCALP     LAPAROSCOPIC NISSEN FUNDOPLICATION N/A 02/03/8936   Procedure: LAPAROSCOPIC NISSEN FUNDOPLICATION AND HIATAL HERNIA REPAIR;  Surgeon: Johnathan Hausen, MD;  Location: [facility];  Service: General;  Laterality: N/A;    I have reviewed the social history and family history with the patient and they are unchanged from previous note.  ALLERGIES:  has No Known Allergies.  MEDICATIONS:  Current Outpatient Medications  Medication Sig Dispense Refill   acetaminophen (TYLENOL) 500 MG tablet Take 1,000 mg by mouth daily as needed for headache.     anastrozole (ARIMIDEX) 1 MG tablet Take 1 tablet (1 mg total) by mouth daily. 90 tablet 3   aspirin 81 MG tablet Take 81 mg by mouth every morning.      atorvastatin (LIPITOR) 80 MG tablet Take 40 mg by mouth every evening.     cimetidine (TAGAMET) 200 MG tablet Take 200 mg by mouth 2 (two) times daily.     enalapril (VASOTEC) 10 MG tablet Take 10 mg by mouth daily.      ondansetron (ZOFRAN-ODT) 4 MG disintegrating tablet Take 1 tablet (4 mg total) by mouth every 6 (six) hours as needed for nausea. 20 tablet 0   pantoprazole (PROTONIX) 40 MG tablet Take 40  mg by mouth daily.     Simethicone (GAS-X PO) Take 1-2 tablets by mouth 2 (two) times daily as needed (stomach irritation).     No current facility-administered medications for this visit.    PHYSICAL EXAMINATION: ECOG PERFORMANCE STATUS: 0 - Asymptomatic  Vitals:   07/12/21 1024  BP: (!) 159/72  Pulse: 88  Resp: 17  Temp: 98.2 F (36.8 C)  SpO2: 97%   Wt Readings from Last 3 Encounters:  07/12/21 136 lb 6.4 oz (61.9 kg)  01/11/21 139 lb 6.4 oz (63.2 kg)  08/10/20 141 lb (64 kg)     GENERAL:alert, no distress and comfortable SKIN: skin  color, texture, turgor are normal, no rashes or significant lesions EYES: normal, Conjunctiva are pink and non-injected, sclera clear  NECK: supple, thyroid normal size, non-tender, without nodularity LYMPH:  no palpable lymphadenopathy in the cervical, axillary  LUNGS: clear to auscultation and percussion with normal breathing effort HEART: regular rate & rhythm and no murmurs and no lower extremity edema ABDOMEN:abdomen soft, non-tender and normal bowel sounds Musculoskeletal:no cyanosis of digits and no clubbing  NEURO: alert & oriented x 3 with fluent speech, no focal motor/sensory deficits BREAST: No palpable mass, nodules or adenopathy bilaterally. Breast exam benign.   LABORATORY DATA:  I have reviewed the data as listed CBC Latest Ref Rng & Units 07/12/2021 01/11/2021 07/14/2020  WBC 4.0 - 10.5 K/uL 11.6(H) 7.6 10.4  Hemoglobin 12.0 - 15.0 g/dL 15.5(H) 14.7 15.4(H)  Hematocrit 36.0 - 46.0 % 47.9(H) 45.4 47.8(H)  Platelets 150 - 400 K/uL 205 243 246     CMP Latest Ref Rng & Units 07/12/2021 01/11/2021 07/14/2020  Glucose 70 - 99 mg/dL 115(H) 111(H) 125(H)  BUN 8 - 23 mg/dL '14 13 14  ' Creatinine 0.44 - 1.00 mg/dL 0.80 0.76 0.81  Sodium 135 - 145 mmol/L 141 143 141  Potassium 3.5 - 5.1 mmol/L 4.2 4.4 4.2  Chloride 98 - 111 mmol/L 106 104 104  CO2 22 - 32 mmol/L '26 31 29  ' Calcium 8.9 - 10.3 mg/dL 9.1 9.6 9.3  Total Protein 6.5 -  8.1 g/dL 7.2 7.1 7.3  Total Bilirubin 0.3 - 1.2 mg/dL 0.5 0.5 0.6  Alkaline Phos 38 - 126 U/L 109 96 88  AST 15 - 41 U/L '19 17 20  ' ALT 0 - 44 U/L '19 18 24      ' RADIOGRAPHIC STUDIES: I have personally reviewed the radiological images as listed and agreed with the findings in the report. No results found.    No orders of the defined types were placed in this encounter.  All questions were answered. The patient knows to call the clinic with any problems, questions or concerns. No barriers to learning was detected. The total time spent in the appointment was 25 minutes.     Truitt Merle, MD 07/12/2021   I, Wilburn Mylar, am acting as scribe for Truitt Merle, MD.   I have reviewed the above documentation for accuracy and completeness, and I agree with the above.

## 2021-07-16 ENCOUNTER — Telehealth: Payer: Self-pay | Admitting: Hematology

## 2021-07-16 NOTE — Telephone Encounter (Signed)
Left message with follow-up appointment per 11/10 los.

## 2021-07-31 ENCOUNTER — Encounter: Payer: Self-pay | Admitting: Orthopaedic Surgery

## 2021-07-31 ENCOUNTER — Other Ambulatory Visit: Payer: Self-pay

## 2021-07-31 ENCOUNTER — Ambulatory Visit (INDEPENDENT_AMBULATORY_CARE_PROVIDER_SITE_OTHER): Payer: Medicare Other | Admitting: Orthopaedic Surgery

## 2021-07-31 VITALS — BP 197/100 | HR 92 | Ht 62.0 in | Wt 136.0 lb

## 2021-07-31 DIAGNOSIS — M65311 Trigger thumb, right thumb: Secondary | ICD-10-CM

## 2021-07-31 NOTE — Progress Notes (Signed)
My other thumb is popping now.  She has developed a trigger thumb on the right side now.  She had it on the left a year ago next month and did well with the injection.  She knows that surgery is the definitive treatment.  She has no trauma, no numbness.  She has locking of the right thumb at the A1 pulley.  NV intact.  Encounter Diagnosis  Name Primary?   Trigger thumb, right thumb Yes   Procedure note:  PROCEDURE  Trigger Finger Injection  The right Thumb  has been locking at the A1 pulley.  The patient has been told about injection of the digit.  Surgical correction and excision of the A1 pulley will resolve the problem.  Ani injection in the digit should help but the results may be short lived.  The patient asked appropriate questions and understands the procedure.  The patient has elected for an injection at this time.  Verbal consent was obtained.  A timeout was taken to confirm the proper hand and digit.  Medication  1 mL of DepoMedrol 40mg   2 mL of 1% lidocaine plain  Ethyl chloride for anesthesia  Alcohol was used to prepare the skin along with ethyl chloride and then the injection was made at the A1 pulley there were no complications.  It was tolerated well.  A Band-aid dressing was applied.  Call if any problem or difficulty.   Call if any problem.  Precautions discussed.  Electronically Signed Sanjuana Kava, MD 11/29/20228:41 AM

## 2021-08-01 ENCOUNTER — Encounter: Payer: Self-pay | Admitting: Hematology

## 2021-08-02 DIAGNOSIS — K449 Diaphragmatic hernia without obstruction or gangrene: Secondary | ICD-10-CM | POA: Diagnosis not present

## 2021-08-02 DIAGNOSIS — E663 Overweight: Secondary | ICD-10-CM | POA: Diagnosis not present

## 2021-08-02 DIAGNOSIS — E785 Hyperlipidemia, unspecified: Secondary | ICD-10-CM | POA: Diagnosis not present

## 2021-08-02 DIAGNOSIS — Z6824 Body mass index (BMI) 24.0-24.9, adult: Secondary | ICD-10-CM | POA: Diagnosis not present

## 2021-08-02 DIAGNOSIS — D509 Iron deficiency anemia, unspecified: Secondary | ICD-10-CM | POA: Diagnosis not present

## 2021-08-02 DIAGNOSIS — I10 Essential (primary) hypertension: Secondary | ICD-10-CM | POA: Diagnosis not present

## 2021-08-10 ENCOUNTER — Other Ambulatory Visit: Payer: Self-pay | Admitting: Family Medicine

## 2021-08-10 ENCOUNTER — Other Ambulatory Visit (HOSPITAL_COMMUNITY): Payer: Self-pay | Admitting: Family Medicine

## 2021-08-10 DIAGNOSIS — R0989 Other specified symptoms and signs involving the circulatory and respiratory systems: Secondary | ICD-10-CM

## 2021-08-15 ENCOUNTER — Ambulatory Visit (HOSPITAL_COMMUNITY)
Admission: RE | Admit: 2021-08-15 | Discharge: 2021-08-15 | Disposition: A | Discharge status: Home / Self Care | Payer: Medicare Other | Source: Ambulatory Visit | Attending: Family Medicine | Admitting: Family Medicine

## 2021-08-15 ENCOUNTER — Other Ambulatory Visit: Payer: Self-pay

## 2021-08-15 DIAGNOSIS — E785 Hyperlipidemia, unspecified: Secondary | ICD-10-CM | POA: Diagnosis not present

## 2021-08-15 DIAGNOSIS — Z720 Tobacco use: Secondary | ICD-10-CM | POA: Diagnosis not present

## 2021-08-15 DIAGNOSIS — I10 Essential (primary) hypertension: Secondary | ICD-10-CM | POA: Diagnosis not present

## 2021-08-15 DIAGNOSIS — R0989 Other specified symptoms and signs involving the circulatory and respiratory systems: Secondary | ICD-10-CM | POA: Insufficient documentation

## 2021-08-23 DIAGNOSIS — I10 Essential (primary) hypertension: Secondary | ICD-10-CM | POA: Diagnosis not present

## 2021-08-23 DIAGNOSIS — Z6824 Body mass index (BMI) 24.0-24.9, adult: Secondary | ICD-10-CM | POA: Diagnosis not present

## 2021-08-23 DIAGNOSIS — R0989 Other specified symptoms and signs involving the circulatory and respiratory systems: Secondary | ICD-10-CM | POA: Diagnosis not present

## 2021-08-23 DIAGNOSIS — F17200 Nicotine dependence, unspecified, uncomplicated: Secondary | ICD-10-CM | POA: Diagnosis not present

## 2021-08-29 ENCOUNTER — Ambulatory Visit (INDEPENDENT_AMBULATORY_CARE_PROVIDER_SITE_OTHER): Payer: Medicare Other | Admitting: Vascular Surgery

## 2021-08-29 ENCOUNTER — Encounter: Payer: Self-pay | Admitting: Vascular Surgery

## 2021-08-29 ENCOUNTER — Other Ambulatory Visit: Payer: Self-pay

## 2021-08-29 VITALS — BP 178/90 | HR 106 | Temp 99.8°F | Wt 131.6 lb

## 2021-08-29 DIAGNOSIS — I6523 Occlusion and stenosis of bilateral carotid arteries: Secondary | ICD-10-CM | POA: Diagnosis not present

## 2021-08-29 NOTE — Progress Notes (Signed)
[facility] [place]  Patient name: Courtney Dorsey MRN: [id number] DOB: 21-Mar-1946 Sex: female  REASON FOR CONSULT: Evaluation carotid disease  HPI: Courtney Dorsey is a 75 y.o. female, who is here today for evaluation and discussion of recent noninvasive carotid duplex.  She has no prior history of neurologic event.  Specifically no history of amaurosis fugax, aphasia, TIA or stroke.  She was felt to have a bruit by physical exam and underwent a recent carotid duplex and is here for further discussion of this.  She does report an unusual symptom of occasionally having lightheadedness.  This can happen multiple times per day and then can go for 1 to 2 months with no similar events.  She has not fallen.  She does not lose consciousness.  Past Medical History:  Diagnosis Date   Anemia    Diabetes mellitus without complication ([place])    "BORDERLINE", off all medication   Diverticulosis    Heart murmur    childhood    History of hiatal hernia    History of radiation therapy 07/21/19- 09/02/19   Left Breast 25 fx of 2 Gy each to total 50 Gy. Left Breast boost 5 fx of 2 Gy each to total 10 Gy   History of skin cancer    History of transfusion    Hypercholesteremia    Hypertension    Personal history of radiation therapy    Skin cancer    UTI (lower urinary tract infection)    TAKING ANTIBIOTICS    Family History  Problem Relation Age of Onset   Colon cancer Mother        diagnosed around age 62   Colon cancer Brother        age 66   Diabetes Sister    Diabetes Brother    Cancer Niece 76       breast cancer    Liver disease Neg Hx    Lung cancer Neg Hx    Breast cancer Neg Hx    Ovarian cancer Neg Hx    Celiac disease Neg Hx     SOCIAL HISTORY: Social History   Socioeconomic History   Marital status: Married    Spouse name: Not on file   Number of children: 1   Years of education: Not on file   Highest  education level: Not on file  Occupational History    Employer: RETIRED  Tobacco Use   Smoking status: Every Day    Packs/day: 1.00    Years: 28.00    Pack years: 28.00    Types: Cigarettes   Smokeless tobacco: Never   Tobacco comments:    trying to quit  Vaping Use   Vaping Use: Never used  Substance and Sexual Activity   Alcohol use: No   Drug use: No   Sexual activity: Yes    Birth control/protection: Post-menopausal  Other Topics Concern   Not on file  Social History Narrative   Pt live with spouse. Retired.  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    4 cups caffeine daily.     Social Determinants of Health   Financial Resource Strain: Not on file  Food Insecurity: Not on file  Transportation Needs: Not on file  Physical Activity: Not on file  Stress: Not on file  Social Connections: Not on file  Intimate Partner Violence: Not on file    No Known Allergies  Current Outpatient Medications  Medication Sig Dispense Refill  acetaminophen (TYLENOL) 500 MG tablet Take 1,000 mg by mouth daily as needed for headache.     anastrozole (ARIMIDEX) 1 MG tablet Take 1 tablet (1 mg total) by mouth daily. 90 tablet 3   aspirin 81 MG tablet Take 81 mg by mouth every morning.      atorvastatin (LIPITOR) 80 MG tablet Take 40 mg by mouth every evening.     cimetidine (TAGAMET) 200 MG tablet Take 200 mg by mouth 2 (two) times daily.     enalapril (VASOTEC) 10 MG tablet Take 10 mg by mouth daily.      ondansetron (ZOFRAN-ODT) 4 MG disintegrating tablet Take 1 tablet (4 mg total) by mouth every 6 (six) hours as needed for nausea. 20 tablet 0   pantoprazole (PROTONIX) 40 MG tablet Take 40 mg by mouth daily.     Simethicone (GAS-X PO) Take 1-2 tablets by mouth 2 (two) times daily as needed (stomach irritation).     No current facility-administered medications for this visit.    REVIEW OF SYSTEMS:  [X]  denotes positive finding, [ ]  denotes negative finding Cardiac  Comments:  Chest pain or  chest pressure:    Shortness of breath upon exertion:    Short of breath when lying flat:    Irregular heart rhythm:        Vascular    Pain in calf, thigh, or hip brought on by ambulation:    Pain in feet at night that wakes you up from your sleep:     Blood clot in your veins:    Leg swelling:         Pulmonary    Oxygen at home:    Productive cough:     Wheezing:         Neurologic    Sudden weakness in arms or legs:  x   Sudden numbness in arms or legs:     Sudden onset of difficulty speaking or slurred speech:    Temporary loss of vision in one eye:     Problems with dizziness:         Gastrointestinal    Blood in stool:     Vomited blood:         Genitourinary    Burning when urinating:     Blood in urine:        Psychiatric    Major depression:         Hematologic    Bleeding problems:    Problems with blood clotting too easily:        Skin    Rashes or ulcers:        Constitutional    Fever or chills:      PHYSICAL EXAM: Vitals:   08/29/21 0958  BP: (!) 178/90  Pulse: (!) 106  Temp: 99.8 F (37.7 C)  TempSrc: Temporal  SpO2: 96%  Weight: 131 lb 9.6 oz (59.7 kg)    GENERAL: The patient is a well-nourished female, in no acute distress. The vital signs are documented above. CARDIOVASCULAR: I do not appreciate carotid bruit today.  2+ radial and 2+ dorsalis pedis pulses bilaterally PULMONARY: There is good air exchange  MUSCULOSKELETAL: There are no major deformities or cyanosis. NEUROLOGIC: No focal weakness or paresthesias are detected. SKIN: There are no ulcers or rashes noted. PSYCHIATRIC: The patient has a normal affect.  DATA:  I reviewed her carotid duplex from 08/15/2021 at [facility] radiology department.  Her internal carotid velocities on the right are  105/27 cm/s.  On the left her velocities are 99/26 cm/s.  She does have some heterogeneous plaque bilaterally.  The interpreting radiologist stated that CT angiogram could be offered if  further definition was warranted.  MEDICAL ISSUES: Asymptomatic disease.  Velocities suggest mild disease.  I would not recommend CT angiogram.  I would recommend that we see her again in 1 year with repeat duplex.  If she has similar velocities at that point I would discontinue surveillance.  I did discuss carotid disease with her and she knows to report immediately should she develop any neurologic symptoms.   Rosetta Posner, MD FACS [facility] [phone] Pager [phone]  Note: Portions of this report may have been transcribed using voice recognition software.  Every effort has been made to ensure accuracy; however, inadvertent computerized transcription errors may still be present.

## 2021-10-18 DIAGNOSIS — Z6824 Body mass index (BMI) 24.0-24.9, adult: Secondary | ICD-10-CM | POA: Diagnosis not present

## 2021-10-18 DIAGNOSIS — Z0001 Encounter for general adult medical examination with abnormal findings: Secondary | ICD-10-CM | POA: Diagnosis not present

## 2021-10-18 DIAGNOSIS — E119 Type 2 diabetes mellitus without complications: Secondary | ICD-10-CM | POA: Diagnosis not present

## 2021-10-18 DIAGNOSIS — E785 Hyperlipidemia, unspecified: Secondary | ICD-10-CM | POA: Diagnosis not present

## 2021-10-18 DIAGNOSIS — R0989 Other specified symptoms and signs involving the circulatory and respiratory systems: Secondary | ICD-10-CM | POA: Diagnosis not present

## 2021-10-18 DIAGNOSIS — K449 Diaphragmatic hernia without obstruction or gangrene: Secondary | ICD-10-CM | POA: Diagnosis not present

## 2021-10-18 DIAGNOSIS — F17200 Nicotine dependence, unspecified, uncomplicated: Secondary | ICD-10-CM | POA: Diagnosis not present

## 2021-10-18 DIAGNOSIS — I10 Essential (primary) hypertension: Secondary | ICD-10-CM | POA: Diagnosis not present

## 2021-10-18 DIAGNOSIS — Z1331 Encounter for screening for depression: Secondary | ICD-10-CM | POA: Diagnosis not present

## 2021-10-18 DIAGNOSIS — D509 Iron deficiency anemia, unspecified: Secondary | ICD-10-CM | POA: Diagnosis not present

## 2022-01-09 ENCOUNTER — Inpatient Hospital Stay: Payer: Medicare Other

## 2022-01-09 ENCOUNTER — Encounter: Payer: Self-pay | Admitting: Hematology

## 2022-01-09 ENCOUNTER — Inpatient Hospital Stay: Payer: Medicare Other | Attending: Hematology | Admitting: Hematology

## 2022-01-09 VITALS — BP 151/78 | HR 86 | Temp 99.0°F | Resp 18 | Ht 62.0 in | Wt 127.8 lb

## 2022-01-09 DIAGNOSIS — Z87891 Personal history of nicotine dependence: Secondary | ICD-10-CM | POA: Insufficient documentation

## 2022-01-09 DIAGNOSIS — E2839 Other primary ovarian failure: Secondary | ICD-10-CM | POA: Diagnosis not present

## 2022-01-09 DIAGNOSIS — Z87440 Personal history of urinary (tract) infections: Secondary | ICD-10-CM | POA: Diagnosis not present

## 2022-01-09 DIAGNOSIS — Z170 Estrogen receptor positive status [ER+]: Secondary | ICD-10-CM | POA: Insufficient documentation

## 2022-01-09 DIAGNOSIS — Z1231 Encounter for screening mammogram for malignant neoplasm of breast: Secondary | ICD-10-CM

## 2022-01-09 DIAGNOSIS — Z923 Personal history of irradiation: Secondary | ICD-10-CM | POA: Insufficient documentation

## 2022-01-09 DIAGNOSIS — Z9049 Acquired absence of other specified parts of digestive tract: Secondary | ICD-10-CM | POA: Insufficient documentation

## 2022-01-09 DIAGNOSIS — Z79899 Other long term (current) drug therapy: Secondary | ICD-10-CM | POA: Insufficient documentation

## 2022-01-09 DIAGNOSIS — M816 Localized osteoporosis [Lequesne]: Secondary | ICD-10-CM | POA: Diagnosis not present

## 2022-01-09 DIAGNOSIS — Z85828 Personal history of other malignant neoplasm of skin: Secondary | ICD-10-CM | POA: Diagnosis not present

## 2022-01-09 DIAGNOSIS — Z79811 Long term (current) use of aromatase inhibitors: Secondary | ICD-10-CM | POA: Diagnosis not present

## 2022-01-09 DIAGNOSIS — Z8719 Personal history of other diseases of the digestive system: Secondary | ICD-10-CM | POA: Insufficient documentation

## 2022-01-09 DIAGNOSIS — C50412 Malignant neoplasm of upper-outer quadrant of left female breast: Secondary | ICD-10-CM | POA: Diagnosis not present

## 2022-01-09 DIAGNOSIS — I10 Essential (primary) hypertension: Secondary | ICD-10-CM | POA: Insufficient documentation

## 2022-01-09 LAB — CBC WITH DIFFERENTIAL (CANCER CENTER ONLY)
Abs Immature Granulocytes: 0.03 10*3/uL (ref 0.00–0.07)
Basophils Absolute: 0.1 10*3/uL (ref 0.0–0.1)
Basophils Relative: 1 %
Eosinophils Absolute: 0.2 10*3/uL (ref 0.0–0.5)
Eosinophils Relative: 2 %
HCT: 46.4 % — ABNORMAL HIGH (ref 36.0–46.0)
Hemoglobin: 15.9 g/dL — ABNORMAL HIGH (ref 12.0–15.0)
Immature Granulocytes: 0 %
Lymphocytes Relative: 16 %
Lymphs Abs: 1.4 10*3/uL (ref 0.7–4.0)
MCH: 30.2 pg (ref 26.0–34.0)
MCHC: 34.3 g/dL (ref 30.0–36.0)
MCV: 88.2 fL (ref 80.0–100.0)
Monocytes Absolute: 0.9 10*3/uL (ref 0.1–1.0)
Monocytes Relative: 10 %
Neutro Abs: 6.4 10*3/uL (ref 1.7–7.7)
Neutrophils Relative %: 71 %
Platelet Count: 220 10*3/uL (ref 150–400)
RBC: 5.26 MIL/uL — ABNORMAL HIGH (ref 3.87–5.11)
RDW: 14.2 % (ref 11.5–15.5)
WBC Count: 9.1 10*3/uL (ref 4.0–10.5)
nRBC: 0 % (ref 0.0–0.2)

## 2022-01-09 LAB — CMP (CANCER CENTER ONLY)
ALT: 26 U/L (ref 0–44)
AST: 26 U/L (ref 15–41)
Albumin: 4 g/dL (ref 3.5–5.0)
Alkaline Phosphatase: 67 U/L (ref 38–126)
Anion gap: 8 (ref 5–15)
BUN: 15 mg/dL (ref 8–23)
CO2: 28 mmol/L (ref 22–32)
Calcium: 9.6 mg/dL (ref 8.9–10.3)
Chloride: 103 mmol/L (ref 98–111)
Creatinine: 0.82 mg/dL (ref 0.44–1.00)
GFR, Estimated: >60 mL/min (ref >60)
Glucose, Bld: 86 mg/dL (ref 70–99)
Potassium: 4 mmol/L (ref 3.5–5.1)
Sodium: 139 mmol/L (ref 135–145)
Total Bilirubin: 0.6 mg/dL (ref 0.3–1.2)
Total Protein: 7.3 g/dL (ref 6.5–8.1)

## 2022-01-09 MED ORDER — LOSARTAN POTASSIUM-HCTZ 100-25 MG PO TABS: 1.0000 | ORAL_TABLET | Freq: Every day | ORAL | 0 refills | Status: DC

## 2022-01-09 NOTE — Progress Notes (Addendum)
?[place]   ?Telephone:(336) [phone] [phone]   ?Clinic Follow up Note  ? ?Patient Care Team: ?Sharilyn Sites, MD as PCP - General (Family Medicine) ?Rourk, Cristopher Estimable, MD as Consulting Physician (Gastroenterology) ?Alphonsa Overall, MD as Consulting Physician (General Surgery) ?Eppie Gibson, MD as Attending Physician (Radiation Oncology) ?Truitt Merle, MD as Consulting Physician (Hematology) ?Mauro Kaufmann, RN as Oncology Nurse Navigator ?Rockwell Germany, RN as Oncology Nurse Navigator ?Alla Feeling, NP as Nurse Practitioner (Nurse Practitioner) ? ?Date of Service:  01/09/2022 ? ?CHIEF COMPLAINT: f/u of left breast cancer ? ?CURRENT THERAPY:  ?Anastrozole 1 mg daily starting 09/2019 ? ?ASSESSMENT & PLAN:  ?Courtney Dorsey is a 76 y.o. post-menopausal female with  ? ?1. 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 Path showed 0.7 cm and 2.8 cm IDC and DCIS, 2/3 positive lymph nodes, negative margins.   ?-mammaprint returned low risk. She completed adjuvant RT.  ?-She did not proceed with genetic testing.  ?-she started Anastrozole in 09/2019. She is tolerating well. Plan for a total of 7 years (2028). ?-last MM 04/17/21 was benign. ?-She is clinically doing well. Her physical exam was unremarkable. There is no clinical concern for recurrence. ?-Continue surveillance. She is due to mammogram in 04/2022 ?-Continue Anastrozole. She is tolerating well  ?-F/u in 6 months  ?  ?2. HTN ?-now on hyzaar, better controlled.  ?-Continue to f/u with PCP  ?  ?3. Smoking Cessation  ?-She has a long h/o smoking 1ppd for 25+ years. She has previously tried to quit but has only been able to reduce use.  ?-I again discussed smoking cessation, and encouraged her to quit  ?  ?4. Bone Health  ?-I discussed AI such as Anastrozole can weaken  her bone. Will monitor with DEXA scan every 2 years.  ?-Her latest DEXA from 10/2019 was normal with lowest T-score +0.3.  ?-plan for repeat with mammogram in 04/2022 ?  ?  ?PLAN:  ?-Continue Anastrozole  ?-mammogram and DEXA in 04/2022 ?-Lab and F/u with NP Lacie in 6 months  ? ? ?No problem-specific Assessment & Plan notes found for this encounter. ? ? ?SUMMARY OF ONCOLOGIC HISTORY: ?Oncology History Overview Note  ?Cancer Staging ?Carcinoma of upper-outer quadrant of left breast in female, estrogen receptor positive ([place]) ?Staging form: Breast, AJCC 8th Edition ?- Clinical stage from 05/05/2019: Stage IB (cT1c, cN1, cM0, G2, ER+, PR+, HER2-) - Signed by Eppie Gibson, MD on 05/05/2019 ?- Pathologic stage from 06/17/2019: Stage IA (pT1b, pN1a, cM0, G2, ER+, PR+, HER2-) - Signed by Truitt Merle, MD on 09/10/2019 ? ?  ?Carcinoma of upper-outer quadrant of left breast in female, estrogen receptor positive ([place])  ?04/06/2019 Mammogram  ? Diagnostic mammogram 04/06/19  ?IMPRESSION: ?1. 2 x 1.6 x 1.9cm irregular breast mass with associated architectural ?distortion in the 2 o'clock position of the left breast, 6 cm from the nipple ?corresponding to the palpable abnormality. This is highly suspicious ?for breast malignancy. ?2. Small probably benign mass with central calcifications in the 7 ?o'clock position of the breast, measuring 7x4x7 mm. This likely ?corresponds to a focal opacity with calcifications seen ?mammographically that has been stable. ?3. Single abnormal left axillary lymph node with a cortical ?thickness of 6 mm. ?  ?04/13/2019 Initial Biopsy  ? Diagnosis 04/13/19 ?1. Breast, left, needle core biopsy,  upper outer quadrant at 2:00 ?- INVASIVE MAMMARY CARCINOMA, GRADE II. ?2. Lymph node, needle/core biopsy, left axilla ?- METASTATIC CARCINOMA. ?  ?04/13/2019 Receptors her2  ? Results: ?GROUP 5: HER2 **NEGATIVE** ?Estrogen Receptor: 100%, POSITIVE, STRONG STAINING INTENSITY ?Progesterone Receptor: 5%, POSITIVE, STRONG STAINING  INTENSITY ?Proliferation Marker Ki67: 15% ?  ?05/05/2019 Initial Diagnosis  ? Carcinoma of upper-outer quadrant of left breast in female, estrogen receptor positive ([place]) ? ?  ?05/05/2019 Cancer Staging  ? Staging form: Breast, AJCC 8th Edition ?- Clinical stage from 05/05/2019: Stage IB (cT1c, cN1, cM0, G2, ER+, PR+, HER2-) - Signed by Eppie Gibson, MD on 05/05/2019 ? ?  ?05/18/2019 Pathology Results  ? FINAL MICROSCOPIC DIAGNOSIS: 05/18/19 ?A. BREAST, LEFT, BIOPSY:  ?- Invasive ductal carcinoma, grade 2.  See comment  ?- Ductal carcinoma in situ, intermediate grade  ?  ?06/17/2019 Surgery  ? LEFT BREAST LUMPECTOMY X2 WITH RADIOACTIVE SEED X2 AND LEFT SEED TARGETED AXILLARY LYMPH NODE EXCISION AND SENTINEL LYMPH NODE EXCISION  ?by Dr Lucia Gaskins 06/17/19 ?  ?06/17/2019 Pathology Results  ? FINAL MICROSCOPIC DIAGNOSIS: 06/17/19 ? ?A. BREAST, LEFT, 7 O'CLOCK, LUMPECTOMY:  ?- Invasive ductal carcinoma, 0.7 cm, Nottingham grade 2 of 3.  ?- Margins of resection are not involved (Closest margin: Less than 1 mm,  ?Superior/medial).  ?- See oncology table.  ? ?B. BREAST, LEFT, 2 O'CLOCK, LUMPECTOMY:  ?- Invasive ductal carcinoma, 2.8 cm, Nottingham grade 2 of 3.  ?- Margins of resection are not involved (Closest margin: Less than 1 mm,  ?Posterior).  ?- Ductal carcinoma in situ.  ?- See oncology table.  ? ?C. BREAST, LEFT, ADDITIONAL INFERIOR MARGIN, 7 O'CLOCK, EXCISION:  ?- Breast tissue, negative for carcinoma.  ? ?D. SENTINEL LYMPH NODE, LEFT AXILLARY, BIOPSY:  ?- Metastatic carcinoma present in one of one lymph node (1/1).  ?- Biopsy site changes.  ? ?E. SENTINEL LYMPH NODE, LEFT AXILLARY, BIOPSY:  ?- Metastatic carcinoma present in one of one lymph node (1/1).  ? ?F. SENTINEL LYMPH NODE, LEFT AXILLARY, BIOPSY:  ?- One lymph node, negative for carcinoma (0/1). ? ? ? ?  ?06/17/2019 Miscellaneous  ? Mammaprint  ?Low Risk Luminal Type A with 10% risk of recurrence in 10 years.  ?Index +0.436 ?97.8% benefit of hormonal therapy.  ?   ?06/17/2019 Cancer Staging  ? Staging form: Breast, AJCC 8th Edition ?- Pathologic stage from 06/17/2019: Stage IA (pT1b, pN1a, cM0, G2, ER+, PR+, HER2-) - Signed by Truitt Merle, MD on 09/10/2019 ? ?  ?07/21/2019 - 09/02/2019 Radiation Therapy  ? Adjuvant radiation with Dr Isidore Moos 07/21/19-09/02/19 ?  ?09/2019 -  Anti-estrogen oral therapy  ? Anastrozole 35m daily starting 09/2019  ?  ?12/09/2019 Survivorship  ? Virtual - SCP delivered by LCira Rue NP  ?  ? ? ? ?INTERVAL HISTORY:  ?Courtney DONSONis here for a follow up of breast cancer. She was last seen by me on 07/12/21. She presents to the clinic alone. ?She reports she is doing well overall. She notes some difficulty with her HTN, now better managed. ?  ?All other systems were reviewed with the patient and are negative. ? ?MEDICAL HISTORY:  ?Past Medical History:  ?Diagnosis Date  ? Anemia   ? Diabetes mellitus without complication ([place]   ? "BORDERLINE", off all medication  ? Diverticulosis   ? Heart murmur   ? childhood   ? History of hiatal hernia   ? History of radiation therapy 07/21/19- 09/02/19  ? Left Breast 25 fx of 2  Gy each to total 50 Gy. Left Breast boost 5 fx of 2 Gy each to total 10 Gy  ? History of skin cancer   ? History of transfusion   ? Hypercholesteremia   ? Hypertension   ? Personal history of radiation therapy   ? Skin cancer   ? UTI (lower urinary tract infection)   ? TAKING ANTIBIOTICS  ? ? ?SURGICAL HISTORY: ?Past Surgical History:  ?Procedure Laterality Date  ? BREAST LUMPECTOMY Left   ? BREAST LUMPECTOMY WITH RADIOACTIVE SEED AND AXILLARY LYMPH NODE DISSECTION Left 06/17/2019  ? Procedure: LEFT BREAST LUMPECTOMY X2  WITH RADIOACTIVE SEED X2 AND LEFT SEED TARGETED AXILLARY LYMPH NODE EXCISION AND SENTINEL LYMPH NODE EXCISION;  Surgeon: Alphonsa Overall, MD;  Location: [facility];  Service: General;  Laterality: Left;  ? CHOLECYSTECTOMY    ? COLONOSCOPY  2009  ? Dr. Aviva Signs  ? COLONOSCOPY N/A 01/01/2013  ? PQZ:RAQTMA mucosa  in the terminal ileum  Mild diverticulosis in the descending colon and sigmoid colon and sigmoid colon/Moderate sized internal hemorrhoids  ? ESOPHAGOGASTRODUODENOSCOPY N/A 01/01/2013  ? UQJ:FHLKT hiatal he

## 2022-01-14 ENCOUNTER — Telehealth: Payer: Self-pay | Admitting: Hematology

## 2022-01-14 NOTE — Telephone Encounter (Signed)
Unable to leave message with follow-up appointment per 5/10 los. Patient is Mychart active. ?

## 2022-01-21 DIAGNOSIS — Z961 Presence of intraocular lens: Secondary | ICD-10-CM | POA: Diagnosis not present

## 2022-01-21 DIAGNOSIS — H35372 Puckering of macula, left eye: Secondary | ICD-10-CM | POA: Diagnosis not present

## 2022-01-21 DIAGNOSIS — H43811 Vitreous degeneration, right eye: Secondary | ICD-10-CM | POA: Diagnosis not present

## 2022-01-21 DIAGNOSIS — H17823 Peripheral opacity of cornea, bilateral: Secondary | ICD-10-CM | POA: Diagnosis not present

## 2022-01-21 DIAGNOSIS — E119 Type 2 diabetes mellitus without complications: Secondary | ICD-10-CM | POA: Diagnosis not present

## 2022-02-20 ENCOUNTER — Encounter: Payer: Self-pay | Admitting: Hematology

## 2022-03-04 ENCOUNTER — Encounter: Payer: Self-pay | Admitting: Nurse Practitioner

## 2022-03-06 ENCOUNTER — Other Ambulatory Visit: Payer: Self-pay

## 2022-03-06 DIAGNOSIS — Z170 Estrogen receptor positive status [ER+]: Secondary | ICD-10-CM

## 2022-03-07 ENCOUNTER — Other Ambulatory Visit (HOSPITAL_COMMUNITY): Payer: Self-pay | Admitting: Hematology

## 2022-03-07 DIAGNOSIS — Z9889 Other specified postprocedural states: Secondary | ICD-10-CM

## 2022-03-13 ENCOUNTER — Ambulatory Visit (HOSPITAL_COMMUNITY)
Admission: RE | Admit: 2022-03-13 | Discharge: 2022-03-13 | Disposition: A | Discharge status: Home / Self Care | Payer: Medicare Other | Source: Ambulatory Visit | Attending: Hematology | Admitting: Hematology

## 2022-03-13 DIAGNOSIS — E2839 Other primary ovarian failure: Secondary | ICD-10-CM | POA: Insufficient documentation

## 2022-03-13 DIAGNOSIS — M85832 Other specified disorders of bone density and structure, left forearm: Secondary | ICD-10-CM | POA: Diagnosis not present

## 2022-04-23 ENCOUNTER — Ambulatory Visit (HOSPITAL_COMMUNITY)
Admission: RE | Admit: 2022-04-23 | Discharge: 2022-04-23 | Disposition: A | Discharge status: Home / Self Care | Payer: Medicare Other | Source: Ambulatory Visit | Attending: Hematology | Admitting: Hematology

## 2022-04-23 ENCOUNTER — Encounter (HOSPITAL_COMMUNITY): Payer: Self-pay

## 2022-04-23 DIAGNOSIS — Z853 Personal history of malignant neoplasm of breast: Secondary | ICD-10-CM | POA: Diagnosis not present

## 2022-04-23 DIAGNOSIS — C50412 Malignant neoplasm of upper-outer quadrant of left female breast: Secondary | ICD-10-CM | POA: Insufficient documentation

## 2022-04-23 DIAGNOSIS — Z170 Estrogen receptor positive status [ER+]: Secondary | ICD-10-CM | POA: Diagnosis not present

## 2022-04-23 DIAGNOSIS — Z9889 Other specified postprocedural states: Secondary | ICD-10-CM

## 2022-07-15 ENCOUNTER — Other Ambulatory Visit: Payer: Medicare Other

## 2022-07-15 ENCOUNTER — Ambulatory Visit: Payer: Medicare Other | Admitting: Nurse Practitioner

## 2022-07-22 ENCOUNTER — Other Ambulatory Visit: Payer: Self-pay

## 2022-07-22 DIAGNOSIS — Z170 Estrogen receptor positive status [ER+]: Secondary | ICD-10-CM

## 2022-07-23 ENCOUNTER — Encounter: Payer: Self-pay | Admitting: Nurse Practitioner

## 2022-07-23 ENCOUNTER — Inpatient Hospital Stay (HOSPITAL_BASED_OUTPATIENT_CLINIC_OR_DEPARTMENT_OTHER): Payer: Medicare Other | Admitting: Nurse Practitioner

## 2022-07-23 ENCOUNTER — Inpatient Hospital Stay: Payer: Medicare Other | Attending: Nurse Practitioner

## 2022-07-23 VITALS — BP 157/75 | HR 77 | Temp 97.9°F | Resp 14 | Wt 124.3 lb

## 2022-07-23 DIAGNOSIS — Z8719 Personal history of other diseases of the digestive system: Secondary | ICD-10-CM | POA: Diagnosis not present

## 2022-07-23 DIAGNOSIS — Z85828 Personal history of other malignant neoplasm of skin: Secondary | ICD-10-CM | POA: Diagnosis not present

## 2022-07-23 DIAGNOSIS — C50412 Malignant neoplasm of upper-outer quadrant of left female breast: Secondary | ICD-10-CM | POA: Diagnosis not present

## 2022-07-23 DIAGNOSIS — Z79899 Other long term (current) drug therapy: Secondary | ICD-10-CM | POA: Diagnosis not present

## 2022-07-23 DIAGNOSIS — Z170 Estrogen receptor positive status [ER+]: Secondary | ICD-10-CM | POA: Diagnosis not present

## 2022-07-23 DIAGNOSIS — F17200 Nicotine dependence, unspecified, uncomplicated: Secondary | ICD-10-CM | POA: Insufficient documentation

## 2022-07-23 DIAGNOSIS — Z79811 Long term (current) use of aromatase inhibitors: Secondary | ICD-10-CM | POA: Insufficient documentation

## 2022-07-23 DIAGNOSIS — Z9049 Acquired absence of other specified parts of digestive tract: Secondary | ICD-10-CM | POA: Insufficient documentation

## 2022-07-23 DIAGNOSIS — Z87440 Personal history of urinary (tract) infections: Secondary | ICD-10-CM | POA: Diagnosis not present

## 2022-07-23 LAB — CMP (CANCER CENTER ONLY)
ALT: 12 U/L (ref 0–44)
AST: 17 U/L (ref 15–41)
Albumin: 4.2 g/dL (ref 3.5–5.0)
Alkaline Phosphatase: 65 U/L (ref 38–126)
Anion gap: 5 (ref 5–15)
BUN: 13 mg/dL (ref 8–23)
CO2: 30 mmol/L (ref 22–32)
Calcium: 9.8 mg/dL (ref 8.9–10.3)
Chloride: 101 mmol/L (ref 98–111)
Creatinine: 0.84 mg/dL (ref 0.44–1.00)
GFR, Estimated: >60 mL/min (ref >60)
Glucose, Bld: 114 mg/dL — ABNORMAL HIGH (ref 70–99)
Potassium: 3.8 mmol/L (ref 3.5–5.1)
Sodium: 136 mmol/L (ref 135–145)
Total Bilirubin: 0.7 mg/dL (ref 0.3–1.2)
Total Protein: 6.9 g/dL (ref 6.5–8.1)

## 2022-07-23 LAB — CBC WITH DIFFERENTIAL (CANCER CENTER ONLY)
Abs Immature Granulocytes: 0.02 10*3/uL (ref 0.00–0.07)
Basophils Absolute: 0.1 10*3/uL (ref 0.0–0.1)
Basophils Relative: 1 %
Eosinophils Absolute: 0.2 10*3/uL (ref 0.0–0.5)
Eosinophils Relative: 2 %
HCT: 45.6 % (ref 36.0–46.0)
Hemoglobin: 15.4 g/dL — ABNORMAL HIGH (ref 12.0–15.0)
Immature Granulocytes: 0 %
Lymphocytes Relative: 13 %
Lymphs Abs: 1 10*3/uL (ref 0.7–4.0)
MCH: 30 pg (ref 26.0–34.0)
MCHC: 33.8 g/dL (ref 30.0–36.0)
MCV: 88.9 fL (ref 80.0–100.0)
Monocytes Absolute: 0.8 10*3/uL (ref 0.1–1.0)
Monocytes Relative: 10 %
Neutro Abs: 5.8 10*3/uL (ref 1.7–7.7)
Neutrophils Relative %: 74 %
Platelet Count: 234 10*3/uL (ref 150–400)
RBC: 5.13 MIL/uL — ABNORMAL HIGH (ref 3.87–5.11)
RDW: 14.2 % (ref 11.5–15.5)
WBC Count: 7.9 10*3/uL (ref 4.0–10.5)
nRBC: 0 % (ref 0.0–0.2)

## 2022-07-23 MED ORDER — ANASTROZOLE 1 MG PO TABS: 1.0000 mg | ORAL_TABLET | Freq: Every day | ORAL | 3 refills | Status: DC

## 2022-07-23 NOTE — Progress Notes (Signed)
Courtney Dorsey   Telephone:(336) [phone] Fax:(336) [phone]   Clinic Follow up Note   Patient Care Team: Sharilyn Sites, MD as PCP - General (Family Medicine) Courtney Dorsey, Courtney Estimable, MD as Consulting Physician (Gastroenterology) Courtney Overall, MD as Consulting Physician (General Surgery) Courtney Gibson, MD as Attending Physician (Radiation Oncology) Courtney Merle, MD as Consulting Physician (Hematology) Courtney Kaufmann, RN as Oncology Nurse Navigator Courtney Germany, RN as Oncology Nurse Navigator Courtney Feeling, NP as Nurse Practitioner (Nurse Practitioner) 07/23/2022  CHIEF COMPLAINT: Follow up left breast cancer   SUMMARY OF ONCOLOGIC HISTORY: Oncology History Overview Note  Cancer Staging Carcinoma of upper-outer quadrant of left breast in female, estrogen receptor positive ([place]) Staging form: Breast, AJCC 8th Edition - Clinical stage from 05/05/2019: Stage IB (cT1c, cN1, cM0, G2, ER+, PR+, HER2-) - Signed by Courtney Gibson, MD on 05/05/2019 - Pathologic stage from 06/17/2019: Stage IA (pT1b, pN1a, cM0, G2, ER+, PR+, HER2-) - Signed by Courtney Merle, MD on 09/10/2019    Carcinoma of upper-outer quadrant of left breast in female, estrogen receptor positive ([place])  04/06/2019 Mammogram   Diagnostic mammogram 04/06/19  IMPRESSION: 1. 2 x 1.6 x 1.9cm irregular breast mass with associated architectural distortion in the 2 o'clock position of the left breast, 6 cm from the nipple corresponding to the palpable abnormality. This is highly suspicious for breast malignancy. 2. Small probably benign mass with central calcifications in the 7 o'clock position of the breast, measuring 7x4x7 mm. This likely corresponds to a focal opacity with calcifications seen mammographically that has been stable. 3. Single abnormal left axillary lymph node with a cortical thickness of 6 mm.   04/13/2019 Initial Biopsy   Diagnosis 04/13/19 1. Breast, left, needle core biopsy, upper outer quadrant at 2:00 -  INVASIVE MAMMARY CARCINOMA, GRADE II. 2. Lymph node, needle/core biopsy, left axilla - METASTATIC CARCINOMA.   04/13/2019 Receptors her2   Results: GROUP 5: HER2 **NEGATIVE** Estrogen Receptor: 100%, POSITIVE, STRONG STAINING INTENSITY Progesterone Receptor: 5%, POSITIVE, STRONG STAINING INTENSITY Proliferation Marker Ki67: 15%   05/05/2019 Initial Diagnosis   Carcinoma of upper-outer quadrant of left breast in female, estrogen receptor positive ([place])   05/05/2019 Cancer Staging   Staging form: Breast, AJCC 8th Edition - Clinical stage from 05/05/2019: Stage IB (cT1c, cN1, cM0, G2, ER+, PR+, HER2-) - Signed by Courtney Gibson, MD on 05/05/2019   05/18/2019 Pathology Results   FINAL MICROSCOPIC DIAGNOSIS: 05/18/19 A. BREAST, LEFT, BIOPSY:  - Invasive ductal carcinoma, grade 2.  See comment  - Ductal carcinoma in situ, intermediate grade    06/17/2019 Surgery   LEFT BREAST LUMPECTOMY X2 WITH RADIOACTIVE SEED X2 AND LEFT SEED TARGETED AXILLARY LYMPH NODE EXCISION AND SENTINEL LYMPH NODE EXCISION  by Dr Lucia Gaskins 06/17/19   06/17/2019 Pathology Results   FINAL MICROSCOPIC DIAGNOSIS: 06/17/19  A. BREAST, LEFT, 7 O'CLOCK, LUMPECTOMY:  - Invasive ductal carcinoma, 0.7 cm, Nottingham grade 2 of 3.  - Margins of resection are not involved (Closest margin: Less than 1 mm,  Superior/medial).  - See oncology table.   B. BREAST, LEFT, 2 O'CLOCK, LUMPECTOMY:  - Invasive ductal carcinoma, 2.8 cm, Nottingham grade 2 of 3.  - Margins of resection are not involved (Closest margin: Less than 1 mm,  Posterior).  - Ductal carcinoma in situ.  - See oncology table.   C. BREAST, LEFT, ADDITIONAL INFERIOR MARGIN, 7 O'CLOCK, EXCISION:  - Breast tissue, negative for carcinoma.   D. SENTINEL LYMPH NODE, LEFT AXILLARY, BIOPSY:  - Metastatic  carcinoma present in one of one lymph node (1/1).  - Biopsy site changes.   E. SENTINEL LYMPH NODE, LEFT AXILLARY, BIOPSY:  - Metastatic carcinoma present in one of one  lymph node (1/1).   F. SENTINEL LYMPH NODE, LEFT AXILLARY, BIOPSY:  - One lymph node, negative for carcinoma (0/1).      06/17/2019 Miscellaneous   Mammaprint  Low Risk Luminal Type A with 10% risk of recurrence in 10 years.  Index +0.436 97.8% benefit of hormonal therapy.    06/17/2019 Cancer Staging   Staging form: Breast, AJCC 8th Edition - Pathologic stage from 06/17/2019: Stage IA (pT1b, pN1a, cM0, G2, ER+, PR+, HER2-) - Signed by Courtney Merle, MD on 09/10/2019   07/21/2019 - 09/02/2019 Radiation Therapy   Adjuvant radiation with Dr Isidore Moos 07/21/19-09/02/19   09/2019 -  Anti-estrogen oral therapy   Anastrozole 66m daily starting 09/2019    12/09/2019 Survivorship   Virtual - SCP delivered by LCira Rue NP      CURRENT THERAPY: Anastrozole 1 mg daily starting 09/2019  INTERVAL HISTORY: Ms. WHeinyreturns for follow up as scheduled. Last seen by Dr. FBurr Medico5/10/23.  DEXA in July showed mild osteopenia at the forearm, she takes calcium and vitamin D.  Mammogram 04/23/22 was negative. She continues anastrozole, tolerating without obvious side effects.  Denies hot flashes, bone or joint pain, dryness, mood fluctuations.  She denies concerns in the breast such as new lump/mass, nipple discharge or inversion, or skin change.  She continues smoking.   All other systems were reviewed with the patient and are negative.  MEDICAL HISTORY:  Past Medical History:  Diagnosis Date   Anemia    Diabetes mellitus without complication ([place]    "BORDERLINE", off all medication   Diverticulosis    Heart murmur    childhood    History of hiatal hernia    History of radiation therapy 07/21/19- 09/02/19   Left Breast 25 fx of 2 Gy each to total 50 Gy. Left Breast boost 5 fx of 2 Gy each to total 10 Gy   History of skin cancer    History of transfusion    Hypercholesteremia    Hypertension    Personal history of radiation therapy    Skin cancer    UTI (lower urinary tract infection)    TAKING  ANTIBIOTICS    SURGICAL HISTORY: Past Surgical History:  Procedure Laterality Date   BREAST LUMPECTOMY Left    BREAST LUMPECTOMY WITH RADIOACTIVE SEED AND AXILLARY LYMPH NODE DISSECTION Left 06/17/2019   Procedure: LEFT BREAST LUMPECTOMY X2  WITH RADIOACTIVE SEED X2 AND LEFT SEED TARGETED AXILLARY LYMPH NODE EXCISION AND SENTINEL LYMPH NODE EXCISION;  Surgeon: NAlphonsa Overall MD;  [place]  Service: General;  Laterality: Left;   CHOLECYSTECTOMY     COLONOSCOPY  2009   Dr. MAviva Signs  COLONOSCOPY N/A 01/01/2013   SION:GEXBMWmucosa in the terminal ileum  Mild diverticulosis in the descending colon and sigmoid colon and sigmoid colon/Moderate sized internal hemorrhoids   ESOPHAGOGASTRODUODENOSCOPY N/A 01/01/2013   SUXL:KGMWNhiatal hernia/ MODERATE SIZE PARA-ESOPHAGEAL HERNIA/MILD Erosive gastritis. chronic duodenitis c/w peptic duodenitis. no h.pylori or villous atrophy. minimal chronic gastritis.    ESOPHAGOGASTRODUODENOSCOPY N/A 02/16/2013   Procedure: ESOPHAGOGASTRODUODENOSCOPY (EGD);  Surgeon: RDaneil Dolin MD;  [facility];  Service: Endoscopy;  Laterality: N/A;  8:30   GIVENS CAPSULE STUDY N/A 02/16/2013   EGD with capsule placement. no evidence of paraesophageal hernia. SB essentially unremarkable.  HIATAL HERNIA REPAIR N/A 03/21/2017   Procedure: LAPAROSCOPIC lysis of adhesions,reduction hiatal hernia, upper endoscopy,gastropexy;  Surgeon: Johnathan Hausen, MD;  Location: [facility];  Service: General;  Laterality: N/A;   KNOT REMOVED FROM SCALP     LAPAROSCOPIC NISSEN FUNDOPLICATION N/A 09/07/9676   Procedure: LAPAROSCOPIC NISSEN FUNDOPLICATION AND HIATAL HERNIA REPAIR;  Surgeon: Johnathan Hausen, MD;  Location: [facility];  Service: General;  Laterality: N/A;    I have reviewed the social history and family history with the patient and they are unchanged from previous note.  ALLERGIES:  has No Known Allergies.  MEDICATIONS:  Current Outpatient  Medications  Medication Sig Dispense Refill   acetaminophen (TYLENOL) 500 MG tablet Take 1,000 mg by mouth daily as needed for headache.     aspirin 81 MG tablet Take 81 mg by mouth every morning.      atorvastatin (LIPITOR) 80 MG tablet Take 40 mg by mouth every evening.     cimetidine (TAGAMET) 200 MG tablet Take 200 mg by mouth 2 (two) times daily.     losartan-hydrochlorothiazide (HYZAAR) 100-25 MG tablet Take 1 tablet by mouth daily. 30 tablet 0   ondansetron (ZOFRAN-ODT) 4 MG disintegrating tablet Take 1 tablet (4 mg total) by mouth every 6 (six) hours as needed for nausea. 20 tablet 0   pantoprazole (PROTONIX) 40 MG tablet Take 40 mg by mouth daily.     Simethicone (GAS-X PO) Take 1-2 tablets by mouth 2 (two) times daily as needed (stomach irritation).     anastrozole (ARIMIDEX) 1 MG tablet Take 1 tablet (1 mg total) by mouth daily. 90 tablet 3   No current facility-administered medications for this visit.    PHYSICAL EXAMINATION: ECOG PERFORMANCE STATUS: 0 - Asymptomatic  Vitals:   07/23/22 1005  BP: (!) 157/75  Pulse: 77  Resp: 14  Temp: 97.9 F (36.6 C)  SpO2: 96%   Filed Weights   07/23/22 1005  Weight: 124 lb 4.8 oz (56.4 kg)    GENERAL:alert, no distress and comfortable SKIN: No rash EYES: sclera clear NECK: Without mass LYMPH:  no palpable cervical or supraclavicular lymphadenopathy LUNGS:  normal breathing effort HEART:  no lower extremity edema ABDOMEN:abdomen soft, non-tender and normal bowel sounds NEURO: alert & oriented x 3 with fluent speech, no focal motor/sensory deficits Breast exam: Symmetrical without nipple discharge or inversion.  S/p left lumpectomy, incisions completely healed, mild lymphedema.  No palpable mass or nodularity in either breast or axilla that I could appreciate.  LABORATORY DATA:  I have reviewed the data as listed    Latest Ref Rng & Units 07/23/2022    9:41 AM 01/09/2022    1:36 PM 07/12/2021    9:51 AM  CBC  WBC 4.0 -  10.5 K/uL 7.9  9.1  11.6   Hemoglobin 12.0 - 15.0 g/dL 15.4  15.9  15.5   Hematocrit 36.0 - 46.0 % 45.6  46.4  47.9   Platelets 150 - 400 K/uL 234  220  205         Latest Ref Rng & Units 07/23/2022    9:41 AM 01/09/2022    1:36 PM 07/12/2021    9:51 AM  CMP  Glucose 70 - 99 mg/dL 114  86  115   BUN 8 - 23 mg/dL _0 Creatinine 0.44 - 1.00 mg/dL 0.84  0.82  0.80   Sodium 135 - 145 mmol/L 136  139  141   Potassium 3.5 - 5.1  mmol/L 3.8  4.0  4.2   Chloride 98 - 111 mmol/L 101  103  106   CO2 22 - 32 mmol/L _0 Calcium 8.9 - 10.3 mg/dL 9.8  9.6  9.1   Total Protein 6.5 - 8.1 g/dL 6.9  7.3  7.2   Total Bilirubin 0.3 - 1.2 mg/dL 0.7  0.6  0.5   Alkaline Phos 38 - 126 U/L 65  67  109   AST 15 - 41 U/L _1 ALT 0 - 44 U/L _2 RADIOGRAPHIC STUDIES: I have personally reviewed the radiological images as listed and agreed with the findings in the report. No results found.   ASSESSMENT & PLAN: Courtney Dorsey is a 76 y.o. post-menopausal female with    1. 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 Path showed 0.7 cm and 2.8 cm IDC and DCIS, 2/3 positive lymph nodes, negative margins.   -mammaprint returned low risk, adjuvant chemotherapy was not indicated and she completed adjuvant RT.  -She did not proceed with genetic testing.  -she started Anastrozole in 09/2019. Plan for a total of 7 years (2028). -Mrs. Hipp is clinically doing well.  Tolerating anastrozole without side effects.  Breast exam is benign, labs are unremarkable except mild erythrocytosis likely secondary to smoking.  Mammogram 04/2022 was negative -Dorsey there is no clinical concern for breast cancer recurrence -Continue surveillance and AI, next mammogram 04/2023 -We reviewed the surveillance plan,  return in 6 months, or sooner if needed   2. HTN -now on hyzaar, better controlled.  -Continue to f/u with PCP    3. Smoking Cessation  -She has a long h/o smoking 1ppd for 25+ years. She has previously tried to quit but has only been able to reduce use.  -She has developed mild polycythemia in the past 2 years, Hgb 15.4, likely secondary to smoking  -I again discussed smoking cessation, and encouraged her to quit    4. Bone Health  -We discussed anastrozole can weaken her bone.  -DEXA from 10/2019 was normal with lowest T-score +0.3.  -Most recent DEXA 03/13/2022 showed normal bone density in the femur but mild osteopenia at the forearm radius, lowest T-score -1.2.  Low FRAX score -She takes calcium and vitamin D   Plan: -Recent mammogram, DEXA, and today's labs reviewed -Continue breast cancer surveillance and anastrozole, refilled -Follow-up in 6 months, or sooner if needed -Smoking cessation -Next mammo due 04/2023   All questions were answered. The patient knows to call the clinic with any problems, questions or concerns. No barriers to learning were detected.      Courtney Feeling, NP 07/23/22

## 2022-07-31 DIAGNOSIS — Z23 Encounter for immunization: Secondary | ICD-10-CM | POA: Diagnosis not present

## 2022-09-23 ENCOUNTER — Other Ambulatory Visit: Payer: Self-pay | Admitting: Hematology

## 2022-09-23 DIAGNOSIS — C50412 Malignant neoplasm of upper-outer quadrant of left female breast: Secondary | ICD-10-CM

## 2022-10-31 ENCOUNTER — Encounter: Payer: Self-pay | Admitting: Radiology

## 2022-11-07 ENCOUNTER — Other Ambulatory Visit (HOSPITAL_COMMUNITY): Payer: Self-pay | Admitting: Family Medicine

## 2022-11-07 DIAGNOSIS — E785 Hyperlipidemia, unspecified: Secondary | ICD-10-CM | POA: Diagnosis not present

## 2022-11-07 DIAGNOSIS — F17200 Nicotine dependence, unspecified, uncomplicated: Secondary | ICD-10-CM

## 2022-11-07 DIAGNOSIS — I10 Essential (primary) hypertension: Secondary | ICD-10-CM | POA: Diagnosis not present

## 2022-11-07 DIAGNOSIS — Z1331 Encounter for screening for depression: Secondary | ICD-10-CM | POA: Diagnosis not present

## 2022-11-07 DIAGNOSIS — Z6822 Body mass index (BMI) 22.0-22.9, adult: Secondary | ICD-10-CM | POA: Diagnosis not present

## 2022-11-07 DIAGNOSIS — E1159 Type 2 diabetes mellitus with other circulatory complications: Secondary | ICD-10-CM | POA: Diagnosis not present

## 2022-11-07 DIAGNOSIS — F17210 Nicotine dependence, cigarettes, uncomplicated: Secondary | ICD-10-CM

## 2022-11-07 DIAGNOSIS — D509 Iron deficiency anemia, unspecified: Secondary | ICD-10-CM | POA: Diagnosis not present

## 2022-11-07 DIAGNOSIS — Z0001 Encounter for general adult medical examination with abnormal findings: Secondary | ICD-10-CM | POA: Diagnosis not present

## 2022-11-07 DIAGNOSIS — Z122 Encounter for screening for malignant neoplasm of respiratory organs: Secondary | ICD-10-CM

## 2022-11-11 ENCOUNTER — Ambulatory Visit (HOSPITAL_COMMUNITY)
Admission: RE | Admit: 2022-11-11 | Discharge: 2022-11-11 | Disposition: A | Discharge status: Home / Self Care | Payer: Medicare Other | Source: Ambulatory Visit | Attending: Family Medicine | Admitting: Family Medicine

## 2022-11-11 DIAGNOSIS — F17210 Nicotine dependence, cigarettes, uncomplicated: Secondary | ICD-10-CM | POA: Diagnosis not present

## 2022-11-11 DIAGNOSIS — Z122 Encounter for screening for malignant neoplasm of respiratory organs: Secondary | ICD-10-CM | POA: Diagnosis not present

## 2022-11-11 DIAGNOSIS — F17200 Nicotine dependence, unspecified, uncomplicated: Secondary | ICD-10-CM | POA: Diagnosis not present

## 2023-01-03 ENCOUNTER — Encounter: Payer: Self-pay | Admitting: Internal Medicine

## 2023-01-03 ENCOUNTER — Ambulatory Visit: Payer: Medicare Other | Attending: Internal Medicine | Admitting: Internal Medicine

## 2023-01-03 VITALS — BP 130/88 | HR 84 | Wt 123.0 lb

## 2023-01-03 DIAGNOSIS — R011 Cardiac murmur, unspecified: Secondary | ICD-10-CM | POA: Insufficient documentation

## 2023-01-03 DIAGNOSIS — I359 Nonrheumatic aortic valve disorder, unspecified: Secondary | ICD-10-CM | POA: Diagnosis not present

## 2023-01-03 DIAGNOSIS — I2510 Atherosclerotic heart disease of native coronary artery without angina pectoris: Secondary | ICD-10-CM | POA: Diagnosis not present

## 2023-01-03 DIAGNOSIS — R42 Dizziness and giddiness: Secondary | ICD-10-CM | POA: Diagnosis not present

## 2023-01-03 DIAGNOSIS — I10 Essential (primary) hypertension: Secondary | ICD-10-CM | POA: Diagnosis not present

## 2023-01-03 DIAGNOSIS — I3481 Nonrheumatic mitral (valve) annulus calcification: Secondary | ICD-10-CM | POA: Insufficient documentation

## 2023-01-03 DIAGNOSIS — Z720 Tobacco use: Secondary | ICD-10-CM | POA: Diagnosis not present

## 2023-01-03 NOTE — Progress Notes (Signed)
Cardiology Office Note  Date: 01/03/2023   ID: Courtney Dorsey, DOB 1946/02/05, MRN [id number]  PCP:  Assunta Found, MD  Cardiologist:  None Electrophysiologist:  None   Reason for Office Visit: Imaging evidence of coronary artery calcifications   History of Present Illness: Courtney Dorsey is a 77 y.o. female known to have HTN, HLD was referred to cardiology clinic for imaging evidence of moderate coronary calcifications and calcifications on the aortic/mitral valves.  Patient smokes 1 pack/day, underwent a CT lung cancer screening that showed moderate coronary calcifications and calcifications on the aortic valve and mitral valve.  Echocardiogram from 2018 showed mild to moderate calcifications of the mitral valve and aortic valve with no evidence of stenosis/regurgitation and normal LVEF.  She is here as a new patient visit.  Has no symptoms of angina or DOE.  Denied syncope, palpitations and leg swelling.  She has these sporadic episodes of dizziness associated with generalized weakness lasting for few seconds and resolve spontaneously.  She has these episodes multiple times throughout the day and happens sporadically.  Has no claudication.  Past Medical History:  Diagnosis Date   Anemia    Diabetes mellitus without complication (HCC)    "BORDERLINE", off all medication   Diverticulosis    Heart murmur    childhood    History of hiatal hernia    History of radiation therapy 07/21/19- 09/02/19   Left Breast 25 fx of 2 Gy each to total 50 Gy. Left Breast boost 5 fx of 2 Gy each to total 10 Gy   History of skin cancer    History of transfusion    Hypercholesteremia    Hypertension    Personal history of radiation therapy    Skin cancer    UTI (lower urinary tract infection)    TAKING ANTIBIOTICS    Past Surgical History:  Procedure Laterality Date   BREAST LUMPECTOMY Left    BREAST LUMPECTOMY WITH RADIOACTIVE SEED AND AXILLARY LYMPH NODE DISSECTION Left 06/17/2019    Procedure: LEFT BREAST LUMPECTOMY X2  WITH RADIOACTIVE SEED X2 AND LEFT SEED TARGETED AXILLARY LYMPH NODE EXCISION AND SENTINEL LYMPH NODE EXCISION;  Surgeon: Ovidio Kin, MD;  Location: [facility];  Service: General;  Laterality: Left;   CHOLECYSTECTOMY     COLONOSCOPY  2009   Dr. Franky Macho   COLONOSCOPY N/A 01/01/2013   YQM:VHQION mucosa in the terminal ileum  Mild diverticulosis in the descending colon and sigmoid colon and sigmoid colon/Moderate sized internal hemorrhoids   ESOPHAGOGASTRODUODENOSCOPY N/A 01/01/2013   GEX:BMWUX hiatal hernia/ MODERATE SIZE PARA-ESOPHAGEAL HERNIA/MILD Erosive gastritis. chronic duodenitis c/w peptic duodenitis. no h.pylori or villous atrophy. minimal chronic gastritis.    ESOPHAGOGASTRODUODENOSCOPY N/A 02/16/2013   Procedure: ESOPHAGOGASTRODUODENOSCOPY (EGD);  Surgeon: Corbin Ade, MD;  [facility];  Service: Endoscopy;  Laterality: N/A;  8:30   GIVENS CAPSULE STUDY N/A 02/16/2013   EGD with capsule placement. no evidence of paraesophageal hernia. SB essentially unremarkable.   HIATAL HERNIA REPAIR N/A 03/21/2017   Procedure: LAPAROSCOPIC lysis of adhesions,reduction hiatal hernia, upper endoscopy,gastropexy;  Surgeon: Luretha Murphy, MD;  Location: [facility];  Service: General;  Laterality: N/A;   KNOT REMOVED FROM SCALP     LAPAROSCOPIC NISSEN FUNDOPLICATION N/A 10/10/2015   Procedure: LAPAROSCOPIC NISSEN FUNDOPLICATION AND HIATAL HERNIA REPAIR;  Surgeon: Luretha Murphy, MD;  Location: [facility];  Service: General;  Laterality: N/A;    Current Outpatient Medications  Medication Sig Dispense Refill   acetaminophen (TYLENOL) 500  MG tablet Take 1,000 mg by mouth daily as needed for headache.     anastrozole (ARIMIDEX) 1 MG tablet TAKE 1 TABLET EVERY DAY 90 tablet 3   aspirin 81 MG tablet Take 81 mg by mouth every morning.      atorvastatin (LIPITOR) 80 MG tablet Take 40 mg by mouth every evening.     losartan-hydrochlorothiazide  (HYZAAR) 100-25 MG tablet Take 1 tablet by mouth daily. 30 tablet 0   ondansetron (ZOFRAN-ODT) 4 MG disintegrating tablet Take 1 tablet (4 mg total) by mouth every 6 (six) hours as needed for nausea. 20 tablet 0   pantoprazole (PROTONIX) 40 MG tablet Take 40 mg by mouth daily.     Simethicone (GAS-X PO) Take 1-2 tablets by mouth 2 (two) times daily as needed (stomach irritation).     cimetidine (TAGAMET) 200 MG tablet Take 200 mg by mouth 2 (two) times daily. (Patient not taking: Reported on 01/03/2023)     No current facility-administered medications for this visit.   Allergies:  Patient has no known allergies.   Social History: The patient  reports that she has been smoking cigarettes. She has a 28.00 pack-year smoking history. She has never used smokeless tobacco. She reports that she does not drink alcohol and does not use drugs.   Family History: The patient's family history includes Cancer (age of onset: 27) in her niece; Colon cancer in her brother and mother; Diabetes in her brother and sister.   ROS:  Please see the history of present illness. Otherwise, complete review of systems is positive for none.  All other systems are reviewed and negative.   Physical Exam: VS:  BP 130/88   Pulse 84   Wt 123 lb (55.8 kg)   SpO2 95%   BMI 22.50 kg/m , BMI Body mass index is 22.5 kg/m.  Wt Readings from Last 3 Encounters:  01/03/23 123 lb (55.8 kg)  07/23/22 124 lb 4.8 oz (56.4 kg)  01/09/22 127 lb 12.8 oz (58 kg)    General: Patient appears comfortable at rest. HEENT: Conjunctiva and lids normal, oropharynx clear with moist mucosa. Neck: Supple, no elevated JVP or carotid bruits, no thyromegaly. Lungs: Clear to auscultation, nonlabored breathing at rest. Cardiac: Regular rate and rhythm, systolic murmur+ Abdomen: Soft, nontender, no hepatomegaly, bowel sounds present, no guarding or rebound. Extremities: No pitting edema, distal pulses 2+. Skin: Warm and dry. Musculoskeletal: No  kyphosis. Neuropsychiatric: Alert and oriented x3, affect grossly appropriate.  Recent Labwork: 07/23/2022: ALT 12; AST 17; BUN 13; Creatinine 0.84; Hemoglobin 15.4; Platelet Count 234; Potassium 3.8; Sodium 136  No results found for: "CHOL", "TRIG", "HDL", "CHOLHDL", "VLDL", "LDLCALC", "LDLDIRECT"   Assessment and Plan: Patient is a 77 year old F known to have HTN, HLD was referred to cardiology clinic for imaging evidence of coronary calcifications.  # Moderate coronary artery calcifications on CT chest for lung cancer screening in 2024: Patient has no angina or DOE. Since severity of coronary artery calcifications is not severe, will defer CTA cardiac. She is already on aspirin and high intensity statin which she will continue.  # Calcifications on aortic/mitral valve on CT chest for lung cancer screening in 2024: There is evidence of calcifications on aortic and mitral valves. Physical examination positive for systolic murmur. Will obtain 2D echocardiogram to rule out any stenosis or regurgitant lesions.  # Nicotine abuse: Smokes 1 pack/day, smoking cessation also provided.  She is motivated to cut down cigarettes on a weekly basis and is excited to  show her progress in the next clinic follow-up visit. Smoking cessation instruction/counseling given:  counseled patient on the dangers of tobacco use, advised patient to stop smoking, and reviewed strategies to maximize success   # Dizziness associated with generalized body weakness: Noncardiac in nature. Patient voiced understanding.  # HTN, controlled: Continue losartan-HCTZ 100-25 mg once daily, HTN per PCP.  I have spent a total of 45 minutes with patient reviewing chart, EKGs, labs and examining patient as well as establishing an assessment and plan that was discussed with the patient.  > 50% of time was spent in direct patient care.    Medication Adjustments/Labs and Tests Ordered: Current medicines are reviewed at length with the  patient today.  Concerns regarding medicines are outlined above.   Tests Ordered: Orders Placed This Encounter  Procedures   EKG 12-Lead   ECHOCARDIOGRAM COMPLETE    Medication Changes: No orders of the defined types were placed in this encounter.   Disposition:  Follow up  1 year  Signed, Semiah Konczal Verne Spurr, MD, 01/03/2023 9:15 AM    [facility] [address] [place], [place] [address]

## 2023-01-03 NOTE — Patient Instructions (Signed)
Medication Instructions:  Your physician recommends that you continue on your current medications as directed. Please refer to the Current Medication list given to you today.  *If you need a refill on your cardiac medications before your next appointment, please call your pharmacy*   Lab Work: None If you have labs (blood work) drawn today and your tests are completely normal, you will receive your results only by: MyChart Message (if you have MyChart) OR A paper copy in the mail If you have any lab test that is abnormal or we need to change your treatment, we will call you to review the results.   Testing/Procedures: Your physician has requested that you have an echocardiogram. Echocardiography is a painless test that uses sound waves to create images of your heart. It provides your doctor with information about the size and shape of your heart and how well your heart's chambers and valves are working. This procedure takes approximately one hour. There are no restrictions for this procedure. Please do NOT wear cologne, perfume, aftershave, or lotions (deodorant is allowed). Please arrive 15 minutes prior to your appointment time.    Follow-Up: At [facility], you and your health needs are our priority.  As part of our continuing mission to provide you with exceptional heart care, we have created designated Provider Care Teams.  These Care Teams include your primary Cardiologist (physician) and Advanced Practice Providers (APPs -  Physician Assistants and Nurse Practitioners) who all work together to provide you with the care you need, when you need it.  We recommend signing up for the patient portal called "MyChart".  Sign up information is provided on this After Visit Summary.  MyChart is used to connect with patients for Virtual Visits (Telemedicine).  Patients are able to view lab/test results, encounter notes, upcoming appointments, etc.  Non-urgent messages can be sent to your  provider as well.   To learn more about what you can do with MyChart, go to [email or web address].    Your next appointment:   1 year(s)  Provider:   Vishnu Mallipeddi, MD    Other Instructions    

## 2023-01-21 NOTE — Assessment & Plan Note (Signed)
stage IB, (pT1b, pN1a, cM0), ER+/PR+, HER2-, Grade II, Mammaprint low risk  -Diagnosed in 04/2019, s/p left breast lumpectomy x2 and LN excision. Path showed 0.7 cm and 2.8 cm IDC and DCIS, 2/3 positive lymph nodes, negative margins.   -mammaprint returned low risk, adjuvant chemotherapy was not indicated and she completed adjuvant RT.  -She did not proceed with genetic testing.  -she started Anastrozole in 09/2019. Plan for a total of 7 years (2028). -Overall there is no clinical concern for breast cancer recurrence -Continue surveillance and AI, next mammogram 04/2023

## 2023-01-21 NOTE — Progress Notes (Signed)
[facility]   Telephone:(336) [phone] Fax:([phone]) [phone]   Clinic Follow up Note   Patient Care Team: Assunta Found, MD as PCP - General (Family Medicine) Mallipeddi, Orion Modest, MD as PCP - Cardiology (Cardiology) Jena Gauss Gerrit Friends, MD as Consulting Physician (Gastroenterology) Ovidio Kin, MD as Consulting Physician (General Surgery) Lonie Peak, MD as Attending Physician (Radiation Oncology) Malachy Mood, MD as Consulting Physician (Hematology) Pershing Proud, RN as Oncology Nurse Navigator Donnelly Angelica, RN as Oncology Nurse Navigator Pollyann Samples, NP as Nurse Practitioner (Nurse Practitioner)  Date of Service:  01/22/2023  CHIEF COMPLAINT: f/u of  left breast cancer     CURRENT THERAPY:   Anastrozole 1 mg daily starting 09/2019    ASSESSMENT:  Courtney Dorsey is a 77 y.o. female with   Carcinoma of upper-outer quadrant of left breast in female, estrogen receptor positive (HCC) stage IB, (pT1b, pN1a, cM0), ER+/PR+, HER2-, Grade II, Mammaprint low risk  -Diagnosed in 04/2019, s/p left breast lumpectomy x2 and LN excision. Path showed 0.7 cm and 2.8 cm IDC and DCIS, 2/3 positive lymph nodes, negative margins.   -mammaprint returned low risk, adjuvant chemotherapy was not indicated and she completed adjuvant RT.  -She did not proceed with genetic testing.  -she started Anastrozole in 09/2019. Plan for a total of 7 years (2028). She is tolerating well  -Overall there is no clinical concern for breast cancer recurrence -Continue surveillance and AI, next mammogram 04/2023     PLAN: -lab reviewed -order Mammogram 04/2023 - Continue Anastrozole  -lab and f/u in 6 months with NP Lacie  SUMMARY OF ONCOLOGIC HISTORY: Oncology History Overview Note  Cancer Staging Carcinoma of upper-outer quadrant of left breast in female, estrogen receptor positive (HCC) Staging form: Breast, AJCC 8th Edition - Clinical stage from 05/05/2019: Stage IB (cT1c, cN1, cM0, G2, ER+,  PR+, HER2-) - Signed by Lonie Peak, MD on 05/05/2019 - Pathologic stage from 06/17/2019: Stage IA (pT1b, pN1a, cM0, G2, ER+, PR+, HER2-) - Signed by Malachy Mood, MD on 09/10/2019    Carcinoma of upper-outer quadrant of left breast in female, estrogen receptor positive (HCC)  04/06/2019 Mammogram   Diagnostic mammogram 04/06/19  IMPRESSION: 1. 2 x 1.6 x 1.9cm irregular breast mass with associated architectural distortion in the 2 o'clock position of the left breast, 6 cm from the nipple corresponding to the palpable abnormality. This is highly suspicious for breast malignancy. 2. Small probably benign mass with central calcifications in the 7 o'clock position of the breast, measuring 7x4x7 mm. This likely corresponds to a focal opacity with calcifications seen mammographically that has been stable. 3. Single abnormal left axillary lymph node with a cortical thickness of 6 mm.   04/13/2019 Initial Biopsy   Diagnosis 04/13/19 1. Breast, left, needle core biopsy, upper outer quadrant at 2:00 - INVASIVE MAMMARY CARCINOMA, GRADE II. 2. Lymph node, needle/core biopsy, left axilla - METASTATIC CARCINOMA.   04/13/2019 Receptors her2   Results: GROUP 5: HER2 **NEGATIVE** Estrogen Receptor: 100%, POSITIVE, STRONG STAINING INTENSITY Progesterone Receptor: 5%, POSITIVE, STRONG STAINING INTENSITY Proliferation Marker Ki67: 15%   05/05/2019 Initial Diagnosis   Carcinoma of upper-outer quadrant of left breast in female, estrogen receptor positive (HCC)   05/05/2019 Cancer Staging   Staging form: Breast, AJCC 8th Edition - Clinical stage from 05/05/2019: Stage IB (cT1c, cN1, cM0, G2, ER+, PR+, HER2-) - Signed by Lonie Peak, MD on 05/05/2019   05/18/2019 Pathology Results   FINAL MICROSCOPIC DIAGNOSIS: 05/18/19 A. BREAST, LEFT,  BIOPSY:  - Invasive ductal carcinoma, grade 2.  See comment  - Ductal carcinoma in situ, intermediate grade    06/17/2019 Surgery   LEFT BREAST LUMPECTOMY X2 WITH RADIOACTIVE SEED  X2 AND LEFT SEED TARGETED AXILLARY LYMPH NODE EXCISION AND SENTINEL LYMPH NODE EXCISION  by Dr Ezzard Standing 06/17/19   06/17/2019 Pathology Results   FINAL MICROSCOPIC DIAGNOSIS: 06/17/19  A. BREAST, LEFT, 7 O'CLOCK, LUMPECTOMY:  - Invasive ductal carcinoma, 0.7 cm, Nottingham grade 2 of 3.  - Margins of resection are not involved (Closest margin: Less than 1 mm,  Superior/medial).  - See oncology table.   B. BREAST, LEFT, 2 O'CLOCK, LUMPECTOMY:  - Invasive ductal carcinoma, 2.8 cm, Nottingham grade 2 of 3.  - Margins of resection are not involved (Closest margin: Less than 1 mm,  Posterior).  - Ductal carcinoma in situ.  - See oncology table.   C. BREAST, LEFT, ADDITIONAL INFERIOR MARGIN, 7 O'CLOCK, EXCISION:  - Breast tissue, negative for carcinoma.   D. SENTINEL LYMPH NODE, LEFT AXILLARY, BIOPSY:  - Metastatic carcinoma present in one of one lymph node (1/1).  - Biopsy site changes.   E. SENTINEL LYMPH NODE, LEFT AXILLARY, BIOPSY:  - Metastatic carcinoma present in one of one lymph node (1/1).   F. SENTINEL LYMPH NODE, LEFT AXILLARY, BIOPSY:  - One lymph node, negative for carcinoma (0/1).      06/17/2019 Miscellaneous   Mammaprint  Low Risk Luminal Type A with 10% risk of recurrence in 10 years.  Index +0.436 97.8% benefit of hormonal therapy.    06/17/2019 Cancer Staging   Staging form: Breast, AJCC 8th Edition - Pathologic stage from 06/17/2019: Stage IA (pT1b, pN1a, cM0, G2, ER+, PR+, HER2-) - Signed by Malachy Mood, MD on 09/10/2019   07/21/2019 - 09/02/2019 Radiation Therapy   Adjuvant radiation with Dr Basilio Cairo 07/21/19-09/02/19   09/2019 -  Anti-estrogen oral therapy   Anastrozole 1mg  daily starting 09/2019    12/09/2019 Survivorship   Virtual - SCP delivered by Santiago Glad, NP       INTERVAL HISTORY:  Courtney Dorsey is here for a follow up of  left breast cancer . She was last seen by me on 07/23/2022. She presents to the clinic alone. Pt state that she is doing  well and has no problem with Anastrozole. Pt is clinically doing well.    All other systems were reviewed with the patient and are negative.  MEDICAL HISTORY:  Past Medical History:  Diagnosis Date   Anemia    Diabetes mellitus without complication (HCC)    "BORDERLINE", off all medication   Diverticulosis    Heart murmur    childhood    History of hiatal hernia    History of radiation therapy 07/21/19- 09/02/19   Left Breast 25 fx of 2 Gy each to total 50 Gy. Left Breast boost 5 fx of 2 Gy each to total 10 Gy   History of skin cancer    History of transfusion    Hypercholesteremia    Hypertension    Personal history of radiation therapy    Skin cancer    UTI (lower urinary tract infection)    TAKING ANTIBIOTICS    SURGICAL HISTORY: Past Surgical History:  Procedure Laterality Date   BREAST LUMPECTOMY Left    BREAST LUMPECTOMY WITH RADIOACTIVE SEED AND AXILLARY LYMPH NODE DISSECTION Left 06/17/2019   Procedure: LEFT BREAST LUMPECTOMY X2  WITH RADIOACTIVE SEED X2 AND LEFT SEED TARGETED AXILLARY LYMPH NODE EXCISION AND  SENTINEL LYMPH NODE EXCISION;  Surgeon: Ovidio Kin, MD;  Location: [facility];  Service: General;  Laterality: Left;   CHOLECYSTECTOMY     COLONOSCOPY  2009   Dr. Franky Macho   COLONOSCOPY N/A 01/01/2013   XBJ:YNWGNF mucosa in the terminal ileum  Mild diverticulosis in the descending colon and sigmoid colon and sigmoid colon/Moderate sized internal hemorrhoids   ESOPHAGOGASTRODUODENOSCOPY N/A 01/01/2013   AOZ:HYQMV hiatal hernia/ MODERATE SIZE PARA-ESOPHAGEAL HERNIA/MILD Erosive gastritis. chronic duodenitis c/w peptic duodenitis. no h.pylori or villous atrophy. minimal chronic gastritis.    ESOPHAGOGASTRODUODENOSCOPY N/A 02/16/2013   Procedure: ESOPHAGOGASTRODUODENOSCOPY (EGD);  Surgeon: Corbin Ade, MD;  [facility];  Service: Endoscopy;  Laterality: N/A;  8:30   GIVENS CAPSULE STUDY N/A 02/16/2013   EGD with capsule placement. no  evidence of paraesophageal hernia. SB essentially unremarkable.   HIATAL HERNIA REPAIR N/A 03/21/2017   Procedure: LAPAROSCOPIC lysis of adhesions,reduction hiatal hernia, upper endoscopy,gastropexy;  Surgeon: Luretha Murphy, MD;  Location: [facility];  Service: General;  Laterality: N/A;   KNOT REMOVED FROM SCALP     LAPAROSCOPIC NISSEN FUNDOPLICATION N/A 10/10/2015   Procedure: LAPAROSCOPIC NISSEN FUNDOPLICATION AND HIATAL HERNIA REPAIR;  Surgeon: Luretha Murphy, MD;  Location: [facility];  Service: General;  Laterality: N/A;    I have reviewed the social history and family history with the patient and they are unchanged from previous note.  ALLERGIES:  has No Known Allergies.  MEDICATIONS:  Current Outpatient Medications  Medication Sig Dispense Refill   acetaminophen (TYLENOL) 500 MG tablet Take 1,000 mg by mouth daily as needed for headache.     anastrozole (ARIMIDEX) 1 MG tablet TAKE 1 TABLET EVERY DAY 90 tablet 3   aspirin 81 MG tablet Take 81 mg by mouth every morning.      atorvastatin (LIPITOR) 80 MG tablet Take 40 mg by mouth every evening.     cimetidine (TAGAMET) 200 MG tablet Take 200 mg by mouth 2 (two) times daily. (Patient not taking: Reported on 01/03/2023)     losartan-hydrochlorothiazide (HYZAAR) 100-25 MG tablet Take 1 tablet by mouth daily. 30 tablet 0   ondansetron (ZOFRAN-ODT) 4 MG disintegrating tablet Take 1 tablet (4 mg total) by mouth every 6 (six) hours as needed for nausea. 20 tablet 0   pantoprazole (PROTONIX) 40 MG tablet Take 40 mg by mouth daily.     Simethicone (GAS-X PO) Take 1-2 tablets by mouth 2 (two) times daily as needed (stomach irritation).     No current facility-administered medications for this visit.    PHYSICAL EXAMINATION: ECOG PERFORMANCE STATUS: 0 - Asymptomatic  Vitals:   01/22/23 1144  BP: (!) 147/74  Pulse: 74  Resp: 18  Temp: 98.2 F (36.8 C)  SpO2: 98%   Wt Readings from Last 3 Encounters:  01/22/23 123 lb (55.8 kg)  01/03/23 123  lb (55.8 kg)  07/23/22 124 lb 4.8 oz (56.4 kg)     GENERAL:alert, no distress and comfortable SKIN: skin color normal, no rashes or significant lesions EYES: normal, Conjunctiva are pink and non-injected, sclera clear  NEURO: alert & oriented x 3 with fluent speech NECK:(-) supple, thyroid normal size, non-tender, without nodularity LYMPH: (-) no palpable lymphadenopathy in the cervical, axillary  speech, no focal motor/sensory deficits BREAST: RT breast no palpable mass, LT Breast lumpectomy no palpable mass breast exam benign.     LABORATORY DATA:  I have reviewed the data as listed    Latest Ref Rng & Units 01/22/2023  10:17 AM 07/23/2022    9:41 AM 01/09/2022    1:36 PM  CBC  WBC 4.0 - 10.5 K/uL 11.3  7.9  9.1   Hemoglobin 12.0 - 15.0 g/dL 16.1  09.6  04.5   Hematocrit 36.0 - 46.0 % 45.6  45.6  46.4   Platelets 150 - 400 K/uL 244  234  220         Latest Ref Rng & Units 01/22/2023   10:17 AM 07/23/2022    9:41 AM 01/09/2022    1:36 PM  CMP  Glucose 70 - 99 mg/dL 409  811  86   BUN 8 - 23 mg/dL 16  13  15    Creatinine 0.44 - 1.00 mg/dL 9.14  7.82  9.56   Sodium 135 - 145 mmol/L 139  136  139   Potassium 3.5 - 5.1 mmol/L 4.0  3.8  4.0   Chloride 98 - 111 mmol/L 104  101  103   CO2 22 - 32 mmol/L 32  30  28   Calcium 8.9 - 10.3 mg/dL 9.5  9.8  9.6   Total Protein 6.5 - 8.1 g/dL 6.7  6.9  7.3   Total Bilirubin 0.3 - 1.2 mg/dL 0.5  0.7  0.6   Alkaline Phos 38 - 126 U/L 65  65  67   AST 15 - 41 U/L 19  17  26    ALT 0 - 44 U/L 20  12  26        RADIOGRAPHIC STUDIES: I have personally reviewed the radiological images as listed and agreed with the findings in the report. No results found.    Orders Placed This Encounter  Procedures   MM 3D SCREENING MAMMOGRAM BILATERAL BREAST    Standing Status:   Future    Standing Expiration Date:   01/22/2024    Order Specific Question:   Reason for Exam (SYMPTOM  OR DIAGNOSIS REQUIRED)    Answer:   routine screening     Order Specific Question:   Preferred imaging location?    Answer:   [facility]   All questions were answered. The patient knows to call the clinic with any problems, questions or concerns. No barriers to learning was detected. The total time spent in the appointment was 25 minutes.     Malachy Mood, MD 01/22/2023   Carolin Coy, CMA, am acting as scribe for Malachy Mood, MD.   I have reviewed the above documentation for accuracy and completeness, and I agree with the above.

## 2023-01-22 ENCOUNTER — Inpatient Hospital Stay: Payer: Medicare Other | Attending: Hematology

## 2023-01-22 ENCOUNTER — Inpatient Hospital Stay (HOSPITAL_BASED_OUTPATIENT_CLINIC_OR_DEPARTMENT_OTHER): Payer: Medicare Other | Admitting: Hematology

## 2023-01-22 ENCOUNTER — Encounter: Payer: Self-pay | Admitting: Hematology

## 2023-01-22 VITALS — BP 147/74 | HR 74 | Temp 98.2°F | Resp 18 | Ht 62.0 in | Wt 123.0 lb

## 2023-01-22 DIAGNOSIS — Z79899 Other long term (current) drug therapy: Secondary | ICD-10-CM | POA: Insufficient documentation

## 2023-01-22 DIAGNOSIS — Z85828 Personal history of other malignant neoplasm of skin: Secondary | ICD-10-CM | POA: Insufficient documentation

## 2023-01-22 DIAGNOSIS — Z170 Estrogen receptor positive status [ER+]: Secondary | ICD-10-CM | POA: Diagnosis not present

## 2023-01-22 DIAGNOSIS — E119 Type 2 diabetes mellitus without complications: Secondary | ICD-10-CM | POA: Diagnosis not present

## 2023-01-22 DIAGNOSIS — Z1231 Encounter for screening mammogram for malignant neoplasm of breast: Secondary | ICD-10-CM

## 2023-01-22 DIAGNOSIS — Z79811 Long term (current) use of aromatase inhibitors: Secondary | ICD-10-CM | POA: Diagnosis not present

## 2023-01-22 DIAGNOSIS — Z87440 Personal history of urinary (tract) infections: Secondary | ICD-10-CM | POA: Diagnosis not present

## 2023-01-22 DIAGNOSIS — Z8719 Personal history of other diseases of the digestive system: Secondary | ICD-10-CM | POA: Insufficient documentation

## 2023-01-22 DIAGNOSIS — Z9049 Acquired absence of other specified parts of digestive tract: Secondary | ICD-10-CM | POA: Diagnosis not present

## 2023-01-22 DIAGNOSIS — C50412 Malignant neoplasm of upper-outer quadrant of left female breast: Secondary | ICD-10-CM | POA: Insufficient documentation

## 2023-01-22 DIAGNOSIS — Z923 Personal history of irradiation: Secondary | ICD-10-CM | POA: Insufficient documentation

## 2023-01-22 DIAGNOSIS — I10 Essential (primary) hypertension: Secondary | ICD-10-CM | POA: Insufficient documentation

## 2023-01-22 LAB — CMP (CANCER CENTER ONLY)
ALT: 20 U/L (ref 0–44)
AST: 19 U/L (ref 15–41)
Albumin: 4 g/dL (ref 3.5–5.0)
Alkaline Phosphatase: 65 U/L (ref 38–126)
Anion gap: 3 — ABNORMAL LOW (ref 5–15)
BUN: 16 mg/dL (ref 8–23)
CO2: 32 mmol/L (ref 22–32)
Calcium: 9.5 mg/dL (ref 8.9–10.3)
Chloride: 104 mmol/L (ref 98–111)
Creatinine: 0.86 mg/dL (ref 0.44–1.00)
GFR, Estimated: >60 mL/min (ref >60)
Glucose, Bld: 116 mg/dL — ABNORMAL HIGH (ref 70–99)
Potassium: 4 mmol/L (ref 3.5–5.1)
Sodium: 139 mmol/L (ref 135–145)
Total Bilirubin: 0.5 mg/dL (ref 0.3–1.2)
Total Protein: 6.7 g/dL (ref 6.5–8.1)

## 2023-01-22 LAB — CBC WITH DIFFERENTIAL (CANCER CENTER ONLY)
Abs Immature Granulocytes: 0.04 10*3/uL (ref 0.00–0.07)
Basophils Absolute: 0.1 10*3/uL (ref 0.0–0.1)
Basophils Relative: 1 %
Eosinophils Absolute: 0.2 10*3/uL (ref 0.0–0.5)
Eosinophils Relative: 1 %
HCT: 45.6 % (ref 36.0–46.0)
Hemoglobin: 15 g/dL (ref 12.0–15.0)
Immature Granulocytes: 0 %
Lymphocytes Relative: 12 %
Lymphs Abs: 1.3 10*3/uL (ref 0.7–4.0)
MCH: 28.9 pg (ref 26.0–34.0)
MCHC: 32.9 g/dL (ref 30.0–36.0)
MCV: 87.9 fL (ref 80.0–100.0)
Monocytes Absolute: 1 10*3/uL (ref 0.1–1.0)
Monocytes Relative: 8 %
Neutro Abs: 8.7 10*3/uL — ABNORMAL HIGH (ref 1.7–7.7)
Neutrophils Relative %: 78 %
Platelet Count: 244 10*3/uL (ref 150–400)
RBC: 5.19 MIL/uL — ABNORMAL HIGH (ref 3.87–5.11)
RDW: 14.3 % (ref 11.5–15.5)
WBC Count: 11.3 10*3/uL — ABNORMAL HIGH (ref 4.0–10.5)
nRBC: 0 % (ref 0.0–0.2)

## 2023-01-31 DIAGNOSIS — H35372 Puckering of macula, left eye: Secondary | ICD-10-CM | POA: Diagnosis not present

## 2023-01-31 DIAGNOSIS — E119 Type 2 diabetes mellitus without complications: Secondary | ICD-10-CM | POA: Diagnosis not present

## 2023-01-31 DIAGNOSIS — H17823 Peripheral opacity of cornea, bilateral: Secondary | ICD-10-CM | POA: Diagnosis not present

## 2023-01-31 DIAGNOSIS — H43811 Vitreous degeneration, right eye: Secondary | ICD-10-CM | POA: Diagnosis not present

## 2023-01-31 DIAGNOSIS — Z961 Presence of intraocular lens: Secondary | ICD-10-CM | POA: Diagnosis not present

## 2023-02-05 ENCOUNTER — Ambulatory Visit (HOSPITAL_COMMUNITY)
Admission: RE | Admit: 2023-02-05 | Discharge: 2023-02-05 | Disposition: A | Discharge status: Home / Self Care | Payer: Medicare Other | Source: Ambulatory Visit | Attending: Internal Medicine | Admitting: Internal Medicine

## 2023-02-05 DIAGNOSIS — R011 Cardiac murmur, unspecified: Secondary | ICD-10-CM

## 2023-02-05 LAB — ECHOCARDIOGRAM COMPLETE
AR max vel: 1.31 cm2
AV Area VTI: 1.52 cm2
AV Area mean vel: 1.28 cm2
AV Mean grad: 12 mmHg
AV Peak grad: 21.9 mmHg
Ao pk vel: 2.34 m/s
Area-P 1/2: 2.66 cm2
S' Lateral: 1.9 cm

## 2023-02-05 NOTE — Progress Notes (Signed)
*  PRELIMINARY RESULTS* Echocardiogram 2D Echocardiogram has been performed.  Stacey Drain 02/05/2023, 10:17 AM

## 2023-04-28 ENCOUNTER — Ambulatory Visit (HOSPITAL_COMMUNITY)
Admission: RE | Admit: 2023-04-28 | Discharge: 2023-04-28 | Disposition: A | Discharge status: Home / Self Care | Payer: Medicare Other | Source: Ambulatory Visit | Attending: Hematology | Admitting: Hematology

## 2023-04-28 DIAGNOSIS — Z1231 Encounter for screening mammogram for malignant neoplasm of breast: Secondary | ICD-10-CM | POA: Diagnosis not present

## 2023-06-11 DIAGNOSIS — Z23 Encounter for immunization: Secondary | ICD-10-CM | POA: Diagnosis not present

## 2023-07-23 NOTE — Progress Notes (Signed)
Patient Care Team: Assunta Found, MD as PCP - General (Family Medicine) Jenene Slicker, Orion Modest, MD as PCP - Cardiology (Cardiology) Jena Gauss Gerrit Friends, MD as Consulting Physician (Gastroenterology) Ovidio Kin, MD as Consulting Physician (General Surgery) Lonie Peak, MD as Attending Physician (Radiation Oncology) Malachy Mood, MD as Consulting Physician (Hematology) Pershing Proud, RN as Oncology Nurse Navigator Donnelly Angelica, RN as Oncology Nurse Navigator Pollyann Samples, NP as Nurse Practitioner (Nurse Practitioner)  Clinic Day:  07/27/2023  Referring physician: Assunta Found, MD  ASSESSMENT & PLAN:   Assessment & Plan: Carcinoma of upper-outer quadrant of left breast in female, estrogen receptor positive (HCC) stage IB, (pT1b, pN1a, cM0), ER+/PR+, HER2-, Grade II, Mammaprint low risk  -Diagnosed in 04/2019, s/p left breast lumpectomy x2 and LN excision. Path showed 0.7 cm and 2.8 cm IDC and DCIS, 2/3 positive lymph nodes, negative margins.   -mammaprint returned low risk, adjuvant chemotherapy was not indicated and she completed adjuvant RT.  -She did not proceed with genetic testing.  -she started Anastrozole in 09/2019. Plan for a total of 7 years (2028). -Overall there is no clinical concern for breast cancer recurrence -Continue surveillance and AI -recent mammogram done 04/2023 was negative for evidence of malignancy    Plan: Labs reviewed  -CBC showing WBC 9.9; Hgb 15.3; Hct 46.4; Plt 213; Anc 7.7 -CMP - K 3.9; glucose 91; BUN 16; Creatinine 0.84; eGFR > 60; Ca 9.9; LFTs normal.   Reviewed recent mammogram from 04/22/2023 which was negative.  Repeat in 1 year. Bone density up-to-date.  Done 03/13/2022 with normal results except for osteopenia in the left forearm. -Recommend calcium with vitamin D and low impact exercise daily. Continue anastrozole 1 mg daily.  In general tolerating well. Labs and follow up in 1 year   The patient understands the plans discussed today  and is in agreement with them.  She knows to contact our office if she develops concerns prior to her next appointment.  I provided 25 minutes of face-to-face time during this encounter and > 50% was spent counseling as documented under my assessment and plan.    Carlean Jews, NP  [facility] - A DEPT OF [facility] [address] [place] [address] Dept: [phone] Dept Fax: [phone]   No orders of the defined types were placed in this encounter.     CHIEF COMPLAINT:  CC: f/u left breast cancer   Current Treatment:  anastrozole 1 mg daily starting 09/2019.  INTERVAL HISTORY:  Courtney Dorsey is here today for repeat clinical assessment. She was last seen by Dr. Mosetta Putt 01/22/2023. She had a screening mammogram 04/2023 which was negative for evidence of maligancy.  Tolerating anastrozole well.  Denies hot flashes or night sweats.  Denies unusual joint pain. Her appetite is good. Her weight has been stable. She denies chest pain, chest pressure, or shortness of breath. She denies headaches or visual disturbances. She denies abdominal pain, nausea, vomiting, or changes in bowel or bladder habits.    I have reviewed the past medical history, past surgical history, social history and family history with the patient and they are unchanged from previous note.  ALLERGIES:  has No Known Allergies.  MEDICATIONS:  Current Outpatient Medications  Medication Sig Dispense Refill   acetaminophen (TYLENOL) 500 MG tablet Take 1,000 mg by mouth daily as needed for headache.     anastrozole (ARIMIDEX) 1 MG tablet TAKE 1 TABLET EVERY DAY 90  tablet 3   aspirin 81 MG tablet Take 81 mg by mouth every morning.      atorvastatin (LIPITOR) 80 MG tablet Take 40 mg by mouth every evening.     cimetidine (TAGAMET) 200 MG tablet Take 200 mg by mouth 2 (two) times daily.     losartan-hydrochlorothiazide (HYZAAR) 100-25 MG tablet Take 1 tablet by  mouth daily. 30 tablet 0   ondansetron (ZOFRAN-ODT) 4 MG disintegrating tablet Take 1 tablet (4 mg total) by mouth every 6 (six) hours as needed for nausea. 20 tablet 0   pantoprazole (PROTONIX) 40 MG tablet Take 40 mg by mouth daily.     Simethicone (GAS-X PO) Take 1-2 tablets by mouth 2 (two) times daily as needed (stomach irritation).     No current facility-administered medications for this visit.    HISTORY OF PRESENT ILLNESS:   Oncology History Overview Note  Cancer Staging Carcinoma of upper-outer quadrant of left breast in female, estrogen receptor positive (HCC) Staging form: Breast, AJCC 8th Edition - Clinical stage from 05/05/2019: Stage IB (cT1c, cN1, cM0, G2, ER+, PR+, HER2-) - Signed by Lonie Peak, MD on 05/05/2019 - Pathologic stage from 06/17/2019: Stage IA (pT1b, pN1a, cM0, G2, ER+, PR+, HER2-) - Signed by Malachy Mood, MD on 09/10/2019    Carcinoma of upper-outer quadrant of left breast in female, estrogen receptor positive (HCC)  04/06/2019 Mammogram   Diagnostic mammogram 04/06/19  IMPRESSION: 1. 2 x 1.6 x 1.9cm irregular breast mass with associated architectural distortion in the 2 o'clock position of the left breast, 6 cm from the nipple corresponding to the palpable abnormality. This is highly suspicious for breast malignancy. 2. Small probably benign mass with central calcifications in the 7 o'clock position of the breast, measuring 7x4x7 mm. This likely corresponds to a focal opacity with calcifications seen mammographically that has been stable. 3. Single abnormal left axillary lymph node with a cortical thickness of 6 mm.   04/13/2019 Initial Biopsy   Diagnosis 04/13/19 1. Breast, left, needle core biopsy, upper outer quadrant at 2:00 - INVASIVE MAMMARY CARCINOMA, GRADE II. 2. Lymph node, needle/core biopsy, left axilla - METASTATIC CARCINOMA.   04/13/2019 Receptors her2   Results: GROUP 5: HER2 **NEGATIVE** Estrogen Receptor: 100%, POSITIVE, STRONG STAINING  INTENSITY Progesterone Receptor: 5%, POSITIVE, STRONG STAINING INTENSITY Proliferation Marker Ki67: 15%   05/05/2019 Initial Diagnosis   Carcinoma of upper-outer quadrant of left breast in female, estrogen receptor positive (HCC)   05/05/2019 Cancer Staging   Staging form: Breast, AJCC 8th Edition - Clinical stage from 05/05/2019: Stage IB (cT1c, cN1, cM0, G2, ER+, PR+, HER2-) - Signed by Lonie Peak, MD on 05/05/2019   05/18/2019 Pathology Results   FINAL MICROSCOPIC DIAGNOSIS: 05/18/19 A. BREAST, LEFT, BIOPSY:  - Invasive ductal carcinoma, grade 2.  See comment  - Ductal carcinoma in situ, intermediate grade    06/17/2019 Surgery   LEFT BREAST LUMPECTOMY X2 WITH RADIOACTIVE SEED X2 AND LEFT SEED TARGETED AXILLARY LYMPH NODE EXCISION AND SENTINEL LYMPH NODE EXCISION  by Dr Ezzard Standing 06/17/19   06/17/2019 Pathology Results   FINAL MICROSCOPIC DIAGNOSIS: 06/17/19  A. BREAST, LEFT, 7 O'CLOCK, LUMPECTOMY:  - Invasive ductal carcinoma, 0.7 cm, Nottingham grade 2 of 3.  - Margins of resection are not involved (Closest margin: Less than 1 mm,  Superior/medial).  - See oncology table.   B. BREAST, LEFT, 2 O'CLOCK, LUMPECTOMY:  - Invasive ductal carcinoma, 2.8 cm, Nottingham grade 2 of 3.  - Margins of resection are not involved Engineer, water  margin: Less than 1 mm,  Posterior).  - Ductal carcinoma in situ.  - See oncology table.   C. BREAST, LEFT, ADDITIONAL INFERIOR MARGIN, 7 O'CLOCK, EXCISION:  - Breast tissue, negative for carcinoma.   D. SENTINEL LYMPH NODE, LEFT AXILLARY, BIOPSY:  - Metastatic carcinoma present in one of one lymph node (1/1).  - Biopsy site changes.   E. SENTINEL LYMPH NODE, LEFT AXILLARY, BIOPSY:  - Metastatic carcinoma present in one of one lymph node (1/1).   F. SENTINEL LYMPH NODE, LEFT AXILLARY, BIOPSY:  - One lymph node, negative for carcinoma (0/1).      06/17/2019 Miscellaneous   Mammaprint  Low Risk Luminal Type A with 10% risk of recurrence in 10 years.   Index +0.436 97.8% benefit of hormonal therapy.    06/17/2019 Cancer Staging   Staging form: Breast, AJCC 8th Edition - Pathologic stage from 06/17/2019: Stage IA (pT1b, pN1a, cM0, G2, ER+, PR+, HER2-) - Signed by Malachy Mood, MD on 09/10/2019   07/21/2019 - 09/02/2019 Radiation Therapy   Adjuvant radiation with Dr Basilio Cairo 07/21/19-09/02/19   09/2019 -  Anti-estrogen oral therapy   Anastrozole 1mg  daily starting 09/2019    12/09/2019 Survivorship   Virtual - SCP delivered by Santiago Glad, NP    04/28/2023 Mammogram   3D bilateral screening mammogram IMPRESSION: No mammographic evidence of malignancy.  RECOMMENDATION: Screening mammogram in one year.       REVIEW OF SYSTEMS:   Constitutional: Denies fevers, chills or abnormal weight loss Eyes: Denies blurriness of vision Ears, nose, mouth, throat, and face: Denies mucositis or sore throat Respiratory: Denies cough, dyspnea or wheezes Cardiovascular: Denies palpitation, chest discomfort or lower extremity swelling Gastrointestinal:  Denies nausea, heartburn or change in bowel habits Skin: Denies abnormal skin rashes Lymphatics: Denies new lymphadenopathy or easy bruising Neurological:Denies numbness, tingling or new weaknesses Behavioral/Psych: Mood is stable, no new changes  All other systems were reviewed with the patient and are negative.   VITALS:   Today's Vitals   07/24/23 1120  BP: 120/70  Pulse: 86  Resp: 17  Temp: 97.9 F (36.6 C)  TempSrc: Oral  SpO2: 97%  Weight: 130 lb 1.6 oz (59 kg)   Body mass index is 23.8 kg/m.   Wt Readings from Last 3 Encounters:  07/24/23 130 lb 1.6 oz (59 kg)  01/22/23 123 lb (55.8 kg)  01/03/23 123 lb (55.8 kg)    Body mass index is 23.8 kg/m.  Performance status (ECOG): 0 - Asymptomatic  PHYSICAL EXAM:   GENERAL:alert, no distress and comfortable SKIN: skin color, texture, turgor are normal, no rashes or significant lesions EYES: normal, Conjunctiva are pink and  non-injected, sclera clear OROPHARYNX:no exudate, no erythema and lips, buccal mucosa, and tongue normal  NECK: supple, thyroid normal size, non-tender, without nodularity LYMPH:  no palpable lymphadenopathy in the cervical, axillary or inguinal LUNGS: clear to auscultation and percussion with normal breathing effort HEART: regular rate & rhythm and no murmurs and no lower extremity edema ABDOMEN:abdomen soft, non-tender and normal bowel sounds Musculoskeletal:no cyanosis of digits and no clubbing  NEURO: alert & oriented x 3 with fluent speech, no focal motor/sensory deficits BEAST: No palpable mass or abnormalities bilaterally.  No nipple inversion or discharge.  Left lumpectomy scar which is well-healed.  No tenderness with palpation.  No axillary lymphadenopathy bilaterally.  LABORATORY DATA:  I have reviewed the data as listed    Component Value Date/Time   NA 138 07/24/2023 1047   K 3.9 07/24/2023  1047   CL 103 07/24/2023 1047   CO2 29 07/24/2023 1047   GLUCOSE 91 07/24/2023 1047   BUN 16 07/24/2023 1047   CREATININE 0.84 07/24/2023 1047   CALCIUM 9.9 07/24/2023 1047   PROT 6.9 07/24/2023 1047   ALBUMIN 4.0 07/24/2023 1047   AST 20 07/24/2023 1047   ALT 16 07/24/2023 1047   ALKPHOS 70 07/24/2023 1047   BILITOT 0.6 07/24/2023 1047   GFRNONAA >60 07/24/2023 1047   GFRAA >60 03/09/2020 1033    Lab Results  Component Value Date   WBC 9.9 07/24/2023   NEUTROABS 7.7 07/24/2023   HGB 15.3 (H) 07/24/2023   HCT 46.4 (H) 07/24/2023   MCV 88.5 07/24/2023   PLT 213 07/24/2023

## 2023-07-23 NOTE — Assessment & Plan Note (Signed)
stage IB, (pT1b, pN1a, cM0), ER+/PR+, HER2-, Grade II, Mammaprint low risk  -Diagnosed in 04/2019, s/p left breast lumpectomy x2 and LN excision. Path showed 0.7 cm and 2.8 cm IDC and DCIS, 2/3 positive lymph nodes, negative margins.   -mammaprint returned low risk, adjuvant chemotherapy was not indicated and she completed adjuvant RT.  -She did not proceed with genetic testing.  -she started Anastrozole in 09/2019. Plan for a total of 7 years (2028). -Overall there is no clinical concern for breast cancer recurrence -Continue surveillance and AI -recent mammogram done 04/2023 was negative for evidence of malignancy

## 2023-07-24 ENCOUNTER — Encounter: Payer: Self-pay | Admitting: Nurse Practitioner

## 2023-07-24 ENCOUNTER — Inpatient Hospital Stay: Payer: Medicare Other | Attending: Nurse Practitioner

## 2023-07-24 ENCOUNTER — Other Ambulatory Visit: Payer: Self-pay

## 2023-07-24 ENCOUNTER — Inpatient Hospital Stay (HOSPITAL_BASED_OUTPATIENT_CLINIC_OR_DEPARTMENT_OTHER): Payer: Medicare Other | Admitting: Nurse Practitioner

## 2023-07-24 VITALS — BP 120/70 | HR 86 | Temp 97.9°F | Resp 17 | Wt 130.1 lb

## 2023-07-24 DIAGNOSIS — Z1721 Progesterone receptor positive status: Secondary | ICD-10-CM | POA: Diagnosis not present

## 2023-07-24 DIAGNOSIS — C773 Secondary and unspecified malignant neoplasm of axilla and upper limb lymph nodes: Secondary | ICD-10-CM | POA: Insufficient documentation

## 2023-07-24 DIAGNOSIS — Z1231 Encounter for screening mammogram for malignant neoplasm of breast: Secondary | ICD-10-CM | POA: Diagnosis not present

## 2023-07-24 DIAGNOSIS — Z79899 Other long term (current) drug therapy: Secondary | ICD-10-CM | POA: Insufficient documentation

## 2023-07-24 DIAGNOSIS — N63 Unspecified lump in unspecified breast: Secondary | ICD-10-CM | POA: Insufficient documentation

## 2023-07-24 DIAGNOSIS — Z79811 Long term (current) use of aromatase inhibitors: Secondary | ICD-10-CM | POA: Diagnosis not present

## 2023-07-24 DIAGNOSIS — C50412 Malignant neoplasm of upper-outer quadrant of left female breast: Secondary | ICD-10-CM | POA: Diagnosis not present

## 2023-07-24 DIAGNOSIS — Z170 Estrogen receptor positive status [ER+]: Secondary | ICD-10-CM | POA: Insufficient documentation

## 2023-07-24 LAB — CBC WITH DIFFERENTIAL (CANCER CENTER ONLY)
Abs Immature Granulocytes: 0.03 10*3/uL (ref 0.00–0.07)
Basophils Absolute: 0.1 10*3/uL (ref 0.0–0.1)
Basophils Relative: 1 %
Eosinophils Absolute: 0.1 10*3/uL (ref 0.0–0.5)
Eosinophils Relative: 1 %
HCT: 46.4 % — ABNORMAL HIGH (ref 36.0–46.0)
Hemoglobin: 15.3 g/dL — ABNORMAL HIGH (ref 12.0–15.0)
Immature Granulocytes: 0 %
Lymphocytes Relative: 11 %
Lymphs Abs: 1.1 10*3/uL (ref 0.7–4.0)
MCH: 29.2 pg (ref 26.0–34.0)
MCHC: 33 g/dL (ref 30.0–36.0)
MCV: 88.5 fL (ref 80.0–100.0)
Monocytes Absolute: 0.9 10*3/uL (ref 0.1–1.0)
Monocytes Relative: 9 %
Neutro Abs: 7.7 10*3/uL (ref 1.7–7.7)
Neutrophils Relative %: 78 %
Platelet Count: 213 10*3/uL (ref 150–400)
RBC: 5.24 MIL/uL — ABNORMAL HIGH (ref 3.87–5.11)
RDW: 13.6 % (ref 11.5–15.5)
WBC Count: 9.9 10*3/uL (ref 4.0–10.5)
nRBC: 0 % (ref 0.0–0.2)

## 2023-07-24 LAB — CMP (CANCER CENTER ONLY)
ALT: 16 U/L (ref 0–44)
AST: 20 U/L (ref 15–41)
Albumin: 4 g/dL (ref 3.5–5.0)
Alkaline Phosphatase: 70 U/L (ref 38–126)
Anion gap: 6 (ref 5–15)
BUN: 16 mg/dL (ref 8–23)
CO2: 29 mmol/L (ref 22–32)
Calcium: 9.9 mg/dL (ref 8.9–10.3)
Chloride: 103 mmol/L (ref 98–111)
Creatinine: 0.84 mg/dL (ref 0.44–1.00)
GFR, Estimated: >60 mL/min (ref >60)
Glucose, Bld: 91 mg/dL (ref 70–99)
Potassium: 3.9 mmol/L (ref 3.5–5.1)
Sodium: 138 mmol/L (ref 135–145)
Total Bilirubin: 0.6 mg/dL (ref <1.2)
Total Protein: 6.9 g/dL (ref 6.5–8.1)

## 2023-08-09 ENCOUNTER — Other Ambulatory Visit: Payer: Self-pay | Admitting: Hematology

## 2023-08-09 DIAGNOSIS — Z170 Estrogen receptor positive status [ER+]: Secondary | ICD-10-CM

## 2023-11-10 ENCOUNTER — Other Ambulatory Visit (HOSPITAL_COMMUNITY): Payer: Self-pay | Admitting: Family Medicine

## 2023-11-10 DIAGNOSIS — E785 Hyperlipidemia, unspecified: Secondary | ICD-10-CM | POA: Diagnosis not present

## 2023-11-10 DIAGNOSIS — E1159 Type 2 diabetes mellitus with other circulatory complications: Secondary | ICD-10-CM | POA: Diagnosis not present

## 2023-11-10 DIAGNOSIS — Z0001 Encounter for general adult medical examination with abnormal findings: Secondary | ICD-10-CM | POA: Diagnosis not present

## 2023-11-10 DIAGNOSIS — Z1331 Encounter for screening for depression: Secondary | ICD-10-CM | POA: Diagnosis not present

## 2023-11-10 DIAGNOSIS — Z6824 Body mass index (BMI) 24.0-24.9, adult: Secondary | ICD-10-CM | POA: Diagnosis not present

## 2023-11-10 DIAGNOSIS — D509 Iron deficiency anemia, unspecified: Secondary | ICD-10-CM | POA: Diagnosis not present

## 2023-11-10 DIAGNOSIS — F17200 Nicotine dependence, unspecified, uncomplicated: Secondary | ICD-10-CM

## 2023-11-10 DIAGNOSIS — I10 Essential (primary) hypertension: Secondary | ICD-10-CM | POA: Diagnosis not present

## 2023-11-10 DIAGNOSIS — R011 Cardiac murmur, unspecified: Secondary | ICD-10-CM | POA: Diagnosis not present

## 2023-12-19 DIAGNOSIS — D72829 Elevated white blood cell count, unspecified: Secondary | ICD-10-CM | POA: Diagnosis not present

## 2023-12-23 ENCOUNTER — Encounter (HOSPITAL_COMMUNITY): Payer: Self-pay

## 2023-12-23 ENCOUNTER — Ambulatory Visit (HOSPITAL_COMMUNITY): Payer: Self-pay

## 2024-01-18 ENCOUNTER — Ambulatory Visit (HOSPITAL_COMMUNITY)
Admission: RE | Admit: 2024-01-18 | Discharge: 2024-01-18 | Disposition: A | Discharge status: Home / Self Care | Payer: Self-pay | Source: Ambulatory Visit | Attending: Family Medicine | Admitting: Family Medicine

## 2024-01-18 DIAGNOSIS — J439 Emphysema, unspecified: Secondary | ICD-10-CM | POA: Insufficient documentation

## 2024-01-18 DIAGNOSIS — E279 Disorder of adrenal gland, unspecified: Secondary | ICD-10-CM | POA: Diagnosis not present

## 2024-01-18 DIAGNOSIS — K449 Diaphragmatic hernia without obstruction or gangrene: Secondary | ICD-10-CM | POA: Diagnosis not present

## 2024-01-18 DIAGNOSIS — F17210 Nicotine dependence, cigarettes, uncomplicated: Secondary | ICD-10-CM | POA: Diagnosis not present

## 2024-01-18 DIAGNOSIS — I2510 Atherosclerotic heart disease of native coronary artery without angina pectoris: Secondary | ICD-10-CM | POA: Insufficient documentation

## 2024-01-18 DIAGNOSIS — Z122 Encounter for screening for malignant neoplasm of respiratory organs: Secondary | ICD-10-CM | POA: Insufficient documentation

## 2024-01-18 DIAGNOSIS — F17200 Nicotine dependence, unspecified, uncomplicated: Secondary | ICD-10-CM | POA: Diagnosis present

## 2024-02-16 DIAGNOSIS — E119 Type 2 diabetes mellitus without complications: Secondary | ICD-10-CM | POA: Diagnosis not present

## 2024-02-16 DIAGNOSIS — Z961 Presence of intraocular lens: Secondary | ICD-10-CM | POA: Diagnosis not present

## 2024-02-16 DIAGNOSIS — H43811 Vitreous degeneration, right eye: Secondary | ICD-10-CM | POA: Diagnosis not present

## 2024-02-16 DIAGNOSIS — H35372 Puckering of macula, left eye: Secondary | ICD-10-CM | POA: Diagnosis not present

## 2024-02-16 DIAGNOSIS — H17823 Peripheral opacity of cornea, bilateral: Secondary | ICD-10-CM | POA: Diagnosis not present

## 2024-02-26 DIAGNOSIS — D72829 Elevated white blood cell count, unspecified: Secondary | ICD-10-CM | POA: Diagnosis not present

## 2024-02-26 DIAGNOSIS — Z6823 Body mass index (BMI) 23.0-23.9, adult: Secondary | ICD-10-CM | POA: Diagnosis not present

## 2024-03-04 DIAGNOSIS — Z800 Family history of malignant neoplasm of digestive organs: Secondary | ICD-10-CM | POA: Diagnosis not present

## 2024-03-04 DIAGNOSIS — R55 Syncope and collapse: Secondary | ICD-10-CM | POA: Diagnosis not present

## 2024-03-04 DIAGNOSIS — Z86018 Personal history of other benign neoplasm: Secondary | ICD-10-CM | POA: Diagnosis not present

## 2024-03-09 ENCOUNTER — Encounter: Payer: Self-pay | Admitting: Internal Medicine

## 2024-03-09 ENCOUNTER — Ambulatory Visit: Attending: Internal Medicine | Admitting: Internal Medicine

## 2024-03-09 VITALS — BP 160/82 | HR 86 | Ht 61.0 in | Wt 128.8 lb

## 2024-03-09 DIAGNOSIS — I350 Nonrheumatic aortic (valve) stenosis: Secondary | ICD-10-CM | POA: Diagnosis not present

## 2024-03-09 NOTE — Progress Notes (Unsigned)
 Cardiology Office Note  Date: 03/09/2024   ID: AMELLIA PANIK, DOB 1946-07-27, MRN [id number]  PCP:  Marvine Rush, MD  Cardiologist:  Diannah SHAUNNA Maywood, MD Electrophysiologist:  None   Reason for Office Visit: Imaging evidence of coronary artery calcifications   History of Present Illness: Courtney Dorsey is a 78 y.o. female known to have HTN, HLD was referred to cardiology clinic for imaging evidence of moderate coronary calcifications and calcifications on the aortic/mitral valves.  Patient smokes 1 pack/day, underwent a CT lung cancer screening that showed moderate coronary calcifications and calcifications on the aortic valve and mitral valve.  Echocardiogram from 2018 showed mild to moderate calcifications of the mitral valve and aortic valve with no evidence of stenosis/regurgitation and normal LVEF.  She is here as a new patient visit.  Has no symptoms of angina or DOE.  Denied syncope, palpitations and leg swelling.  She has these sporadic episodes of dizziness associated with generalized weakness lasting for few seconds and resolve spontaneously.  She has these episodes multiple times throughout the day and happens sporadically.  Has no claudication.  Past Medical History:  Diagnosis Date   Anemia    Diabetes mellitus without complication (HCC)    BORDERLINE, off all medication   Diverticulosis    Heart murmur    childhood    History of hiatal hernia    History of radiation therapy 07/21/19- 09/02/19   Left Breast 25 fx of 2 Gy each to total 50 Gy. Left Breast boost 5 fx of 2 Gy each to total 10 Gy   History of skin cancer    History of transfusion    Hypercholesteremia    Hypertension    Personal history of radiation therapy    Skin cancer    UTI (lower urinary tract infection)    TAKING ANTIBIOTICS    Past Surgical History:  Procedure Laterality Date   BREAST LUMPECTOMY Left    BREAST LUMPECTOMY WITH RADIOACTIVE SEED AND AXILLARY LYMPH NODE DISSECTION Left  06/17/2019   Procedure: LEFT BREAST LUMPECTOMY X2  WITH RADIOACTIVE SEED X2 AND LEFT SEED TARGETED AXILLARY LYMPH NODE EXCISION AND SENTINEL LYMPH NODE EXCISION;  Surgeon: Ethyl Lenis, MD;  Location: [facility];  Service: General;  Laterality: Left;   CHOLECYSTECTOMY     COLONOSCOPY  2009   Dr. Oneil Budge   COLONOSCOPY N/A 01/01/2013   DOQ:Wnmfjo mucosa in the terminal ileum  Mild diverticulosis in the descending colon and sigmoid colon and sigmoid colon/Moderate sized internal hemorrhoids   ESOPHAGOGASTRODUODENOSCOPY N/A 01/01/2013   DOQ:Dfjoo hiatal hernia/ MODERATE SIZE PARA-ESOPHAGEAL HERNIA/MILD Erosive gastritis. chronic duodenitis c/w peptic duodenitis. no h.pylori or villous atrophy. minimal chronic gastritis.    ESOPHAGOGASTRODUODENOSCOPY N/A 02/16/2013   Procedure: ESOPHAGOGASTRODUODENOSCOPY (EGD);  Surgeon: Lamar CHRISTELLA Hollingshead, MD;  Location: [facility];  Service: Endoscopy;  Laterality: N/A;  8:30   GIVENS CAPSULE STUDY N/A 02/16/2013   EGD with capsule placement. no evidence of paraesophageal hernia. SB essentially unremarkable.   HIATAL HERNIA REPAIR N/A 03/21/2017   Procedure: LAPAROSCOPIC lysis of adhesions,reduction hiatal hernia, upper endoscopy,gastropexy;  Surgeon: Gladis Cough, MD;  Location: [facility];  Service: General;  Laterality: N/A;   KNOT REMOVED FROM SCALP     LAPAROSCOPIC NISSEN FUNDOPLICATION N/A 10/10/2015   Procedure: LAPAROSCOPIC NISSEN FUNDOPLICATION AND HIATAL HERNIA REPAIR;  Surgeon: Cough Gladis, MD;  Location: [facility];  Service: General;  Laterality: N/A;    Current Outpatient Medications  Medication Sig Dispense Refill  acetaminophen  (TYLENOL ) 500 MG tablet Take 1,000 mg by mouth daily as needed for headache.     anastrozole  (ARIMIDEX ) 1 MG tablet TAKE 1 TABLET EVERY DAY 90 tablet 3   atorvastatin  (LIPITOR) 40 MG tablet Take 40 mg by mouth daily.     Biotin 10 MG TABS Take 1 tablet by mouth daily.     cholecalciferol (VITAMIN D3) 25 MCG  (1000 UNIT) tablet Take 1,000 Units by mouth daily.     cimetidine (TAGAMET) 200 MG tablet Take 200 mg by mouth 2 (two) times daily.     losartan  (COZAAR ) 100 MG tablet Take 100 mg by mouth daily.     ondansetron  (ZOFRAN -ODT) 4 MG disintegrating tablet Take 1 tablet (4 mg total) by mouth every 6 (six) hours as needed for nausea. 20 tablet 0   pantoprazole  (PROTONIX ) 40 MG tablet Take 40 mg by mouth daily.     Simethicone  (GAS-X PO) Take 1-2 tablets by mouth 2 (two) times daily as needed (stomach irritation).     No current facility-administered medications for this visit.   Allergies:  Patient has no known allergies.   Social History: The patient  reports that she has been smoking cigarettes. She has a 28 pack-year smoking history. She has never used smokeless tobacco. She reports that she does not drink alcohol and does not use drugs.   Family History: The patient's family history includes Cancer (age of onset: 83) in her niece; Colon cancer in her brother and mother; Diabetes in her brother and sister.   ROS:  Please see the history of present illness. Otherwise, complete review of systems is positive for none.  All other systems are reviewed and negative.   Physical Exam: VS:  Ht 5' 1 (1.549 m)   Wt 128 lb 12.8 oz (58.4 kg)   BMI 24.34 kg/m , BMI Body mass index is 24.34 kg/m.  Wt Readings from Last 3 Encounters:  03/09/24 128 lb 12.8 oz (58.4 kg)  07/24/23 130 lb 1.6 oz (59 kg)  01/22/23 123 lb (55.8 kg)    General: Patient appears comfortable at rest. HEENT: Conjunctiva and lids normal, oropharynx clear with moist mucosa. Neck: Supple, no elevated JVP or carotid bruits, no thyromegaly. Lungs: Clear to auscultation, nonlabored breathing at rest. Cardiac: Regular rate and rhythm, systolic murmur+ Abdomen: Soft, nontender, no hepatomegaly, bowel sounds present, no guarding or rebound. Extremities: No pitting edema, distal pulses 2+. Skin: Warm and dry. Musculoskeletal: No  kyphosis. Neuropsychiatric: Alert and oriented x3, affect grossly appropriate.  Recent Labwork: 07/24/2023: ALT 16; AST 20; BUN 16; Creatinine 0.84; Hemoglobin 15.3; Platelet Count 213; Potassium 3.9; Sodium 138  No results found for: CHOL, TRIG, HDL, CHOLHDL, VLDL, LDLCALC, LDLDIRECT   Assessment and Plan: Patient is a 78 year old F known to have HTN, HLD was referred to cardiology clinic for imaging evidence of coronary calcifications.  # Moderate coronary artery calcifications on CT chest for lung cancer screening in 2024: Patient has no angina or DOE. Since severity of coronary artery calcifications is not severe, will defer CTA cardiac. She is already on aspirin  and high intensity statin which she will continue.  # Calcifications on aortic/mitral valve on CT chest for lung cancer screening in 2024: There is evidence of calcifications on aortic and mitral valves. Physical examination positive for systolic murmur. Will obtain 2D echocardiogram to rule out any stenosis or regurgitant lesions.  # Nicotine abuse: Smokes 1 pack/day, smoking cessation also provided.  She is motivated to cut down cigarettes  on a weekly basis and is excited to show her progress in the next clinic follow-up visit. Smoking cessation instruction/counseling given:  counseled patient on the dangers of tobacco use, advised patient to stop smoking, and reviewed strategies to maximize success   # Dizziness associated with generalized body weakness: Noncardiac in nature. Patient voiced understanding.  # HTN, controlled: Continue losartan -HCTZ 100-25 mg once daily, HTN per PCP.  I have spent a total of 45 minutes with patient reviewing chart, EKGs, labs and examining patient as well as establishing an assessment and plan that was discussed with the patient.  > 50% of time was spent in direct patient care.    Medication Adjustments/Labs and Tests Ordered: Current medicines are reviewed at length with the  patient today.  Concerns regarding medicines are outlined above.   Tests Ordered: No orders of the defined types were placed in this encounter.   Medication Changes: No orders of the defined types were placed in this encounter.   Disposition:  Follow up 1 year  Signed, Umaiza Matusik Arleta Maywood, MD, 03/09/2024 2:37 PM    [facility] [address], [place], [place] [address]

## 2024-03-09 NOTE — Patient Instructions (Addendum)
 Medication Instructions:  Your physician recommends that you continue on your current medications as directed. Please refer to the Current Medication list given to you today.  Labwork: none  Testing/Procedures: Your physician has requested that you have an echocardiogram in 1 year. Echocardiography is a painless test that uses sound waves to create images of your heart. It provides your doctor with information about the size and shape of your heart and how well your heart's chambers and valves are working. This procedure takes approximately one hour. There are no restrictions for this procedure. Please do NOT wear cologne, perfume, aftershave, or lotions (deodorant is allowed). Please arrive 15 minutes prior to your appointment time.  Please note: We ask at that you not bring children with you during ultrasound (echo/ vascular) testing. Due to room size and safety concerns, children are not allowed in the ultrasound rooms during exams. Our front office staff cannot provide observation of children in our lobby area while testing is being conducted. An adult accompanying a patient to their appointment will only be allowed in the ultrasound room at the discretion of the ultrasound technician under special circumstances. We apologize for any inconvenience.  Follow-Up: Your physician recommends that you schedule a follow-up appointment in: 1.5 years. You will receive a reminder call in about 10 months reminding you to schedule your appointment. If you don't receive this call, please contact our office.  Any Other Special Instructions Will Be Listed Below (If Applicable).  If you need a refill on your cardiac medications before your next appointment, please call your pharmacy.

## 2024-03-10 DIAGNOSIS — I350 Nonrheumatic aortic (valve) stenosis: Secondary | ICD-10-CM | POA: Insufficient documentation

## 2024-03-19 ENCOUNTER — Ambulatory Visit: Admitting: Internal Medicine

## 2024-03-26 ENCOUNTER — Other Ambulatory Visit: Payer: Self-pay | Admitting: Urology

## 2024-03-26 ENCOUNTER — Encounter: Payer: Self-pay | Admitting: Urology

## 2024-03-26 ENCOUNTER — Ambulatory Visit: Admitting: Urology

## 2024-03-26 VITALS — BP 178/79 | HR 84

## 2024-03-26 DIAGNOSIS — D3502 Benign neoplasm of left adrenal gland: Secondary | ICD-10-CM

## 2024-03-26 DIAGNOSIS — D3500 Benign neoplasm of unspecified adrenal gland: Secondary | ICD-10-CM | POA: Diagnosis not present

## 2024-03-26 LAB — MICROSCOPIC EXAMINATION
Bacteria, UA: NONE SEEN
WBC, UA: NONE SEEN /HPF (ref 0–5)

## 2024-03-26 LAB — URINALYSIS, ROUTINE W REFLEX MICROSCOPIC
Bilirubin, UA: NEGATIVE
Glucose, UA: NEGATIVE
Ketones, UA: NEGATIVE
Leukocytes,UA: NEGATIVE
Nitrite, UA: NEGATIVE
Protein,UA: NEGATIVE
Specific Gravity, UA: 1.015 (ref 1.005–1.030)
Urobilinogen, Ur: 1 mg/dL (ref 0.2–1.0)
pH, UA: 7 (ref 5.0–7.5)

## 2024-03-26 NOTE — Progress Notes (Signed)
 03/26/2024 11:01 AM   Courtney Dorsey [birth year] [id number]  Referring provider: Marvine Rush, MD [id number] [place],  [place] [address]  Adrenal mass   HPI: Courtney Dorsey is a [phone] here for evaluation of a left adrenal mass. He was was diagnosed as an adenoma in 2018 at 1.8cm and then it was seen on Ct chest 5/18 and found to be 3.9cm. She has occasional spells where her heart races and she feels a tightness in her chest. He BP has been elevated recently    PMH: Past Medical History:  Diagnosis Date   Anemia    Diabetes mellitus without complication (HCC)    BORDERLINE, off all medication   Diverticulosis    Heart murmur    childhood    History of hiatal hernia    History of radiation therapy 07/21/19- 09/02/19   Left Breast 25 fx of 2 Gy each to total 50 Gy. Left Breast boost 5 fx of 2 Gy each to total 10 Gy   History of skin cancer    History of transfusion    Hypercholesteremia    Hypertension    Personal history of radiation therapy    Skin cancer    UTI (lower urinary tract infection)    TAKING ANTIBIOTICS    Surgical History: Past Surgical History:  Procedure Laterality Date   BREAST LUMPECTOMY Left    BREAST LUMPECTOMY WITH RADIOACTIVE SEED AND AXILLARY LYMPH NODE DISSECTION Left 06/17/2019   Procedure: LEFT BREAST LUMPECTOMY X2  WITH RADIOACTIVE SEED X2 AND LEFT SEED TARGETED AXILLARY LYMPH NODE EXCISION AND SENTINEL LYMPH NODE EXCISION;  Surgeon: Courtney Lenis, MD;  Location: [facility];  Service: General;  Laterality: Left;   CHOLECYSTECTOMY     COLONOSCOPY  2009   Dr. Oneil Dorsey   COLONOSCOPY N/A 01/01/2013   DOQ:Wnmfjo mucosa in the terminal ileum  Mild diverticulosis in the descending colon and sigmoid colon and sigmoid colon/Moderate sized internal hemorrhoids   ESOPHAGOGASTRODUODENOSCOPY N/A 01/01/2013   DOQ:Dfjoo hiatal hernia/ MODERATE SIZE PARA-ESOPHAGEAL HERNIA/MILD Erosive gastritis. chronic duodenitis c/w peptic duodenitis.  no h.pylori or villous atrophy. minimal chronic gastritis.    ESOPHAGOGASTRODUODENOSCOPY N/A 02/16/2013   Procedure: ESOPHAGOGASTRODUODENOSCOPY (EGD);  Surgeon: Courtney CHRISTELLA Hollingshead, MD;  Location: [facility];  Service: Endoscopy;  Laterality: N/A;  8:30   GIVENS CAPSULE STUDY N/A 02/16/2013   EGD with capsule placement. no evidence of paraesophageal hernia. SB essentially unremarkable.   HIATAL HERNIA REPAIR N/A 03/21/2017   Procedure: LAPAROSCOPIC lysis of adhesions,reduction hiatal hernia, upper endoscopy,gastropexy;  Surgeon: Dorsey Cough, MD;  Location: [facility];  Service: General;  Laterality: N/A;   KNOT REMOVED FROM SCALP     LAPAROSCOPIC NISSEN FUNDOPLICATION N/A 10/10/2015   Procedure: LAPAROSCOPIC NISSEN FUNDOPLICATION AND HIATAL HERNIA REPAIR;  Surgeon: Dorsey Gladis, MD;  Location: [facility];  Service: General;  Laterality: N/A;    Home Medications:  Allergies as of 03/26/2024   No Known Allergies      Medication List        Accurate as of March 26, 2024 11:01 AM. If you have any questions, ask your nurse or doctor.          acetaminophen  500 MG tablet Commonly known as: TYLENOL  Take 1,000 mg by mouth daily as needed for headache.   anastrozole  1 MG tablet Commonly known as: ARIMIDEX  TAKE 1 TABLET EVERY DAY   atorvastatin  40 MG tablet Commonly known as: LIPITOR Take 40 mg by mouth daily.   Biotin 10  MG Tabs Take 1 tablet by mouth daily.   cholecalciferol 25 MCG (1000 UNIT) tablet Commonly known as: VITAMIN D3 Take 1,000 Units by mouth daily.   cimetidine 200 MG tablet Commonly known as: TAGAMET Take 200 mg by mouth 2 (two) times daily.   GAS-X PO Take 1-2 tablets by mouth 2 (two) times daily as needed (stomach irritation).   losartan  100 MG tablet Commonly known as: COZAAR  Take 100 mg by mouth daily.   ondansetron  4 MG disintegrating tablet Commonly known as: ZOFRAN -ODT Take 1 tablet (4 mg total) by mouth every 6 (six) hours as needed for nausea.    pantoprazole  40 MG tablet Commonly known as: PROTONIX  Take 40 mg by mouth daily.        Allergies: No Known Allergies  Family History: Family History  Problem Relation Age of Onset   Colon cancer Mother        diagnosed around age 68   Colon cancer Brother        age 76   Diabetes Sister    Diabetes Brother    Cancer Niece 58       breast cancer    Liver disease Neg Hx    Lung cancer Neg Hx    Breast cancer Neg Hx    Ovarian cancer Neg Hx    Celiac disease Neg Hx     Social History:  reports that she has been smoking cigarettes. She has a 28 pack-year smoking history. She has never used smokeless tobacco. She reports that she does not drink alcohol and does not use drugs.  ROS: All other review of systems were reviewed and are negative except what is noted above in HPI  Physical Exam: BP (!) 178/79   Pulse 84   Constitutional:  Alert and oriented, No acute distress. HEENT: [place] AT, moist mucus membranes.  Trachea midline, no masses. Cardiovascular: No clubbing, cyanosis, or edema. Respiratory: Normal respiratory effort, no increased work of breathing. GI: Abdomen is soft, nontender, nondistended, no abdominal masses GU: No CVA tenderness.  Lymph: No cervical or inguinal lymphadenopathy. Skin: No rashes, bruises or suspicious lesions. Neurologic: Grossly intact, no focal deficits, moving all 4 extremities. Psychiatric: Normal mood and affect.  Laboratory Data: Lab Results  Component Value Date   WBC 9.9 07/24/2023   HGB 15.3 (H) 07/24/2023   HCT 46.4 (H) 07/24/2023   MCV 88.5 07/24/2023   PLT 213 07/24/2023    Lab Results  Component Value Date   CREATININE 0.84 07/24/2023    No results found for: PSA  No results found for: TESTOSTERONE  Lab Results  Component Value Date   HGBA1C 6.0 (H) 03/14/2017    Urinalysis    Component Value Date/Time   COLORURINE YELLOW 10/28/2016 1929   APPEARANCEUR CLEAR 10/28/2016 1929   LABSPEC 1.009 10/28/2016  1929   PHURINE 5.0 10/28/2016 1929   GLUCOSEU NEGATIVE 10/28/2016 1929   HGBUR SMALL (A) 10/28/2016 1929   BILIRUBINUR NEGATIVE 10/28/2016 1929   KETONESUR NEGATIVE 10/28/2016 1929   PROTEINUR NEGATIVE 10/28/2016 1929   NITRITE NEGATIVE 10/28/2016 1929   LEUKOCYTESUR SMALL (A) 10/28/2016 1929    Lab Results  Component Value Date   BACTERIA NONE SEEN 10/28/2016    Pertinent Imaging: CT 01/18/24: Images reviewed and discussed with the patient  No results found for this or any previous visit.  No results found for this or any previous visit.  No results found for this or any previous visit.  No results found for  this or any previous visit.  No results found for this or any previous visit.  No results found for this or any previous visit.  No results found for this or any previous visit.  Results for orders placed during the hospital encounter of 10/23/16  CT RENAL STONE STUDY  Narrative CLINICAL DATA:  78 year old female with right flank pain. Swelling right abdomen. Prior fundoplication and cholecystectomy. Initial encounter.  EXAM: CT ABDOMEN AND PELVIS WITHOUT CONTRAST  TECHNIQUE: Multidetector CT imaging of the abdomen and pelvis was performed following the standard protocol without IV contrast.  COMPARISON:  10/01/2015 CT.  FINDINGS: Lower chest: Tiny calcified granuloma right middle lobe. Prominent mitral valve calcifications. Aortic valve calcification. Coronary artery calcification. Heart size within normal limits. Dense breast parenchyma contain coarse calcification on the left.  Hepatobiliary: Taking into account limitation by non contrast imaging, no worrisome hepatic lesion. Postcholecystectomy.  Pancreas: Taking into account limitation by non contrast imaging, no mass or inflammation.  Spleen: Taking into account limitation by non contrast imaging, no mass or enlargement.  Adrenals/Urinary Tract: 3.9 x 2.2 x 2.4 cm complex left adrenal  mass contains tiny calcifications. There has been a change in the density of a component of this lesion. This will require further evaluation with contrast-enhanced dedicated adrenal MR as malignancy cannot be excluded.  No right adrenal lesion.  No obstructing renal or ureteral calculi or evidence hydronephrosis. 6 cm right kidney with tiny peripheral calcifications without significant change. 7 mm slightly exophytic lesion posterior inferior aspect right kidney. It is possible this represents a hyperdense cyst although a solid mass not excluded. This can be assessed with dedicated MR.  Decompressed urinary bladder.  Stomach/Bowel: Moderately large hiatal hernia. Postsurgical changes.  Dilated fluid-filled loops of proximal to mid small bowel of indeterminate etiology possibly related to enteritis without point of obstruction identified. Low grade small bowel obstruction secondary less likely consideration.  Prominent colonic diverticula most notable left colon without extraluminal bowel inflammatory process, free fluid or free air.  Vascular/Lymphatic: Atherosclerotic changes aorta without aneurysmal dilation. Atherosclerotic changes iliac arteries.  No adenopathy.  Reproductive: Endometrium appears prominent.  No adnexal mass noted.  Other: No bowel containing hernia.  Musculoskeletal: Lumbar degenerative changes most notable L4-5.  IMPRESSION: Dilated fluid-filled loops of proximal to mid small bowel of indeterminate etiology possibly related to enteritis without point of obstruction identified. Low grade small bowel obstruction secondary less likely consideration.  Prominent colonic diverticula most notable left colon without extraluminal bowel inflammatory process, free fluid or free air.  Moderately large hiatal hernia.  Postsurgical changes  No obstructing renal or ureteral calculi. 6 cm right renal cyst without change.  **An incidental finding of potential  clinical significance has been found. 3.9 x 2.2 x 2.4 cm complex left adrenal mass contains tiny calcifications. There has been a change in the density of a component of this lesion. This will require further evaluation with contrast-enhanced dedicated adrenal MR as malignancy cannot be excluded.  **An incidental finding of potential clinical significance has been found. 7 mm slightly exophytic lesion posterior inferior aspect right kidney. It is possible this represents a hyperdense cyst although a solid mass not excluded. This can be assessed with dedicated MR.**  **An incidental finding of potential clinical significance has been found. Endometrial lining prominence as previously noted. Follow-up pelvic sonogram recommended.   Electronically Signed By: Elspeth Slain M.D. On: 10/23/2016 06:46   Assessment & Plan:    1. Adrenal adenoma, unspecified laterality (Primary) MRI abd  w/wo - Urinalysis, Routine w reflex microscopic   No follow-ups on file.  Belvie Clara, MD  [facility] [place]

## 2024-03-26 NOTE — Patient Instructions (Signed)
 Adrenal Adenoma  An adrenal adenoma is a benign tumor of the glands that are located on top of each kidney (adrenal glands). These glands produce hormones. A benign tumor means that the growth is not cancer. A person may have one or more tumors in one or both glands. In almost all cases, adrenal adenomas do not cause any symptoms. These are called nonfunctional adenomas. In rare cases, an adenoma may produce high levels of hormones called cortisol or aldosterone. These tumors are called functional adenomas. Adrenal adenomas become more common as people grow older, but are unlikely to become cancerous. However, nonfunctional adenomas may become functional. What are the causes? In most cases, the cause of this condition is not known. In very rare cases, the condition may be passed from parent to child (inherited). Smoking and tobacco use is associated with significant increases in adrenal adenomas. What are the signs or symptoms? Symptoms of this condition depend on the type of adrenal adenoma that you have. Nonfunctional adrenal adenomas usually do not cause any symptoms. Symptoms of functional adrenal adenomas depend on which hormone is produced in high levels. Tumors that secrete cortisol cause a condition called Cushing's syndrome. Signs and symptoms include: Increased fat in the upper body. Tiredness and loss of energy. Muscle weakness. High blood pressure. High blood sugar. Bruising and purple stretch marks in the skin, usually on the upper body. Facial hair, acne, and menstrual irregularities in women. Tumors that secrete aldosterone cause a condition called primary aldosteronism. Signs and symptoms include: High blood pressure that may be difficult to control. Tiredness and loss of energy. Headache. Weakness or numbness. Low potassium levels in your blood. How is this diagnosed? This condition may be diagnosed based on: Your symptoms. Your health care provider may suspect the  condition if you have signs and symptoms of a functional adenoma. A physical exam. Blood and urine tests to check for high levels of hormones. Imaging studies to confirm the diagnosis. These may include: CT scan. MRI. PET scan. Biopsy. For this test, a sample of the tumor is removed and examined in a lab. This is done in rare cases where other tests have not given a clear result. Adrenal adenomas are often found by chance when imaging studies of the abdomen are done for other reasons. How is this treated? Treatment for this condition depends on the type of the adrenal adenoma that you have. You may treated with: Observation. This is done if you have a nonfunctional adrenal adenoma. For observation, you may need: Regular imaging studies to make sure the tumor is not growing. Blood or urine tests to make sure the tumor is not becoming functional. Surgery. This is done if you have a functional adenoma. Surgery is the main treatment for this condition and usually cures it. Medicines. These are used if surgery is not possible. The medicines block the effects of the hormones. Follow these instructions at home: Take over-the-counter and prescription medicines only as told by your health care provider. Return to your normal activities as told by your health care provider. Ask your health care provider what activities are safe for you. Do not use any products that contain nicotine or tobacco. These products include cigarettes, chewing tobacco, and vaping devices, such as e-cigarettes. If you need help quitting, ask your health care provider. Keep all follow-up visits. This is important. This may include visits for regular tests and imaging studies. Contact a health care provider if: You develop any of the signs or symptoms of  Cushing's syndrome or primary aldosteronism. You need help to stop smoking or using other tobacco products. Summary An adrenal adenoma is a benign tumor of the adrenal  gland. Nonfunctional adenomas rarely cause symptoms and do not need to be treated. Functional adenomas produce hormones and may cause symptoms of Cushing's syndrome or primary aldosteronism, depending on the type of hormone they produce. Adrenal adenomas do not become cancerous. Nonfunctional adenomas may become functional. Surgery to remove the tumor is the usual treatment for functional adenomas. This information is not intended to replace advice given to you by your health care provider. Make sure you discuss any questions you have with your health care provider. Document Revised: 07/07/2023 Document Reviewed: 07/07/2023 Elsevier Patient Education  2024 [employer].

## 2024-03-30 LAB — METANEPHRINES, PLASMA
Metanephrine, Free: 48 pg/mL (ref 0.0–88.0)
Normetanephrine, Free: 89.4 pg/mL (ref 0.0–285.2)

## 2024-04-01 ENCOUNTER — Ambulatory Visit: Payer: Self-pay | Admitting: Urology

## 2024-04-02 ENCOUNTER — Ambulatory Visit (HOSPITAL_COMMUNITY)
Admission: RE | Admit: 2024-04-02 | Discharge: 2024-04-02 | Disposition: A | Discharge status: Home / Self Care | Source: Ambulatory Visit | Attending: Urology | Admitting: Urology

## 2024-04-02 DIAGNOSIS — R935 Abnormal findings on diagnostic imaging of other abdominal regions, including retroperitoneum: Secondary | ICD-10-CM | POA: Diagnosis not present

## 2024-04-02 DIAGNOSIS — D3500 Benign neoplasm of unspecified adrenal gland: Secondary | ICD-10-CM | POA: Diagnosis not present

## 2024-04-02 DIAGNOSIS — Z9049 Acquired absence of other specified parts of digestive tract: Secondary | ICD-10-CM | POA: Diagnosis not present

## 2024-04-02 DIAGNOSIS — N281 Cyst of kidney, acquired: Secondary | ICD-10-CM | POA: Diagnosis not present

## 2024-04-02 DIAGNOSIS — E278 Other specified disorders of adrenal gland: Secondary | ICD-10-CM | POA: Diagnosis not present

## 2024-04-02 MED ORDER — GADOBUTROL 1 MMOL/ML IV SOLN
6.0000 mL | Freq: Once | INTRAVENOUS | Status: AC | PRN
  Administered 2024-04-02: 6 mL via INTRAVENOUS

## 2024-04-13 ENCOUNTER — Other Ambulatory Visit (HOSPITAL_COMMUNITY): Payer: Self-pay | Admitting: Hematology

## 2024-04-13 DIAGNOSIS — Z1231 Encounter for screening mammogram for malignant neoplasm of breast: Secondary | ICD-10-CM

## 2024-04-21 ENCOUNTER — Telehealth: Payer: Self-pay

## 2024-04-21 NOTE — Telephone Encounter (Signed)
 Patient had her MRI on 08/01 and her follow up is not until 09/22.  She states you mentioned she may need surgery.  She is asking if you will review imaging and advise if follow up is need or if she should proceed with surgery.  Please review imaging and advise.

## 2024-04-23 ENCOUNTER — Encounter: Payer: Self-pay | Admitting: Radiology

## 2024-05-06 ENCOUNTER — Ambulatory Visit (HOSPITAL_COMMUNITY)
Admission: RE | Admit: 2024-05-06 | Discharge: 2024-05-06 | Disposition: A | Discharge status: Home / Self Care | Source: Ambulatory Visit | Attending: Hematology | Admitting: Hematology

## 2024-05-06 DIAGNOSIS — Z1231 Encounter for screening mammogram for malignant neoplasm of breast: Secondary | ICD-10-CM | POA: Diagnosis not present

## 2024-05-24 ENCOUNTER — Ambulatory Visit: Admitting: Urology

## 2024-05-24 ENCOUNTER — Encounter: Payer: Self-pay | Admitting: Urology

## 2024-05-24 VITALS — BP 157/77 | HR 89

## 2024-05-24 DIAGNOSIS — D3502 Benign neoplasm of left adrenal gland: Secondary | ICD-10-CM

## 2024-05-24 DIAGNOSIS — D3500 Benign neoplasm of unspecified adrenal gland: Secondary | ICD-10-CM

## 2024-05-24 LAB — URINALYSIS, ROUTINE W REFLEX MICROSCOPIC
Bilirubin, UA: NEGATIVE
Glucose, UA: NEGATIVE
Ketones, UA: NEGATIVE
Leukocytes,UA: NEGATIVE
Nitrite, UA: NEGATIVE
Specific Gravity, UA: 1.025 (ref 1.005–1.030)
Urobilinogen, Ur: 0.2 mg/dL (ref 0.2–1.0)
pH, UA: 6 (ref 5.0–7.5)

## 2024-05-24 LAB — MICROSCOPIC EXAMINATION: Bacteria, UA: NONE SEEN

## 2024-05-24 NOTE — Progress Notes (Signed)
 05/24/2024 9:21 AM   Courtney Dorsey [birth year] [id number]  Referring provider: Marvine Rush, MD [address] [place],  [place] [address]  Left adrenal mass   HPI: Ms Courtney Dorsey is a [phone] here for followup for a left adrenal mass. She has a hx of breast cancer treated 04/2019. Courtney Dorsey She underwent repeat MRI which showed increase in left adrenal tumor from 2.5cm to 4.1cm. Plasma metanephrins were normal. No weight gain or weight loss. No significant LUTS   PMH: Past Medical History:  Diagnosis Date   Anemia    Diabetes mellitus without complication (HCC)    BORDERLINE, off all medication   Diverticulosis    Heart murmur    childhood    History of hiatal hernia    History of radiation therapy 07/21/19- 09/02/19   Left Breast 25 fx of 2 Gy each to total 50 Gy. Left Breast boost 5 fx of 2 Gy each to total 10 Gy   History of skin cancer    History of transfusion    Hypercholesteremia    Hypertension    Personal history of radiation therapy    Skin cancer    UTI (lower urinary tract infection)    TAKING ANTIBIOTICS    Surgical History: Past Surgical History:  Procedure Laterality Date   BREAST LUMPECTOMY Left    BREAST LUMPECTOMY WITH RADIOACTIVE SEED AND AXILLARY LYMPH NODE DISSECTION Left 06/17/2019   Procedure: LEFT BREAST LUMPECTOMY X2  WITH RADIOACTIVE SEED X2 AND LEFT SEED TARGETED AXILLARY LYMPH NODE EXCISION AND SENTINEL LYMPH NODE EXCISION;  Surgeon: Ethyl Lenis, MD;  Location: [facility];  Service: General;  Laterality: Left;   CHOLECYSTECTOMY     COLONOSCOPY  2009   Dr. Oneil Budge   COLONOSCOPY N/A 01/01/2013   DOQ:Wnmfjo mucosa in the terminal ileum  Mild diverticulosis in the descending colon and sigmoid colon and sigmoid colon/Moderate sized internal hemorrhoids   ESOPHAGOGASTRODUODENOSCOPY N/A 01/01/2013   DOQ:Dfjoo hiatal hernia/ MODERATE SIZE PARA-ESOPHAGEAL HERNIA/MILD Erosive gastritis. chronic duodenitis c/w peptic duodenitis. no  h.pylori or villous atrophy. minimal chronic gastritis.    ESOPHAGOGASTRODUODENOSCOPY N/A 02/16/2013   Procedure: ESOPHAGOGASTRODUODENOSCOPY (EGD);  Surgeon: Lamar CHRISTELLA Hollingshead, MD;  [facility];  Service: Endoscopy;  Laterality: N/A;  8:30   GIVENS CAPSULE STUDY N/A 02/16/2013   EGD with capsule placement. no evidence of paraesophageal hernia. SB essentially unremarkable.   HIATAL HERNIA REPAIR N/A 03/21/2017   Procedure: LAPAROSCOPIC lysis of adhesions,reduction hiatal hernia, upper endoscopy,gastropexy;  Surgeon: Gladis Cough, MD;  Location: [facility];  Service: General;  Laterality: N/A;   KNOT REMOVED FROM SCALP     LAPAROSCOPIC NISSEN FUNDOPLICATION N/A 10/10/2015   Procedure: LAPAROSCOPIC NISSEN FUNDOPLICATION AND HIATAL HERNIA REPAIR;  Surgeon: Cough Gladis, MD;  Location: [facility];  Service: General;  Laterality: N/A;    Home Medications:  Allergies as of 05/24/2024   No Known Allergies      Medication List        Accurate as of May 24, 2024  9:21 AM. If you have any questions, ask your nurse or doctor.          acetaminophen  500 MG tablet Commonly known as: TYLENOL  Take 1,000 mg by mouth daily as needed for headache.   anastrozole  1 MG tablet Commonly known as: ARIMIDEX  TAKE 1 TABLET EVERY DAY   atorvastatin  40 MG tablet Commonly known as: LIPITOR Take 40 mg by mouth daily.   Biotin 10 MG Tabs Take 1 tablet by mouth daily.  cholecalciferol 25 MCG (1000 UNIT) tablet Commonly known as: VITAMIN D3 Take 1,000 Units by mouth daily.   cimetidine 200 MG tablet Commonly known as: TAGAMET Take 200 mg by mouth 2 (two) times daily.   GAS-X PO Take 1-2 tablets by mouth 2 (two) times daily as needed (stomach irritation).   losartan  100 MG tablet Commonly known as: COZAAR  Take 100 mg by mouth daily.   ondansetron  4 MG disintegrating tablet Commonly known as: ZOFRAN -ODT Take 1 tablet (4 mg total) by mouth every 6 (six) hours as needed for nausea.    pantoprazole  40 MG tablet Commonly known as: PROTONIX  Take 40 mg by mouth daily.        Allergies: No Known Allergies  Family History: Family History  Problem Relation Age of Onset   Colon cancer Mother        diagnosed around age 42   Colon cancer Brother        age 4   Diabetes Sister    Diabetes Brother    Cancer Niece 19       breast cancer    Liver disease Neg Hx    Lung cancer Neg Hx    Breast cancer Neg Hx    Ovarian cancer Neg Hx    Celiac disease Neg Hx     Social History:  reports that she has been smoking cigarettes. She has a 28 pack-year smoking history. She has never used smokeless tobacco. She reports that she does not drink alcohol and does not use drugs.  ROS: All other review of systems were reviewed and are negative except what is noted above in HPI  Physical Exam: BP (!) 157/77   Pulse 89   Constitutional:  Alert and oriented, No acute distress. HEENT: [place] AT, moist mucus membranes.  Trachea midline, no masses. Cardiovascular: No clubbing, cyanosis, or edema. Respiratory: Normal respiratory effort, no increased work of breathing. GI: Abdomen is soft, nontender, nondistended, no abdominal masses GU: No CVA tenderness.  Lymph: No cervical or inguinal lymphadenopathy. Skin: No rashes, bruises or suspicious lesions. Neurologic: Grossly intact, no focal deficits, moving all 4 extremities. Psychiatric: Normal mood and affect.  Laboratory Data: Lab Results  Component Value Date   WBC 9.9 07/24/2023   HGB 15.3 (H) 07/24/2023   HCT 46.4 (H) 07/24/2023   MCV 88.5 07/24/2023   PLT 213 07/24/2023    Lab Results  Component Value Date   CREATININE 0.84 07/24/2023    No results found for: PSA  No results found for: TESTOSTERONE  Lab Results  Component Value Date   HGBA1C 6.0 (H) 03/14/2017    Urinalysis    Component Value Date/Time   COLORURINE YELLOW 10/28/2016 1929   APPEARANCEUR Clear 03/26/2024 1111   LABSPEC 1.009 10/28/2016  1929   PHURINE 5.0 10/28/2016 1929   GLUCOSEU Negative 03/26/2024 1111   HGBUR SMALL (A) 10/28/2016 1929   BILIRUBINUR Negative 03/26/2024 1111   KETONESUR NEGATIVE 10/28/2016 1929   PROTEINUR Negative 03/26/2024 1111   PROTEINUR NEGATIVE 10/28/2016 1929   NITRITE Negative 03/26/2024 1111   NITRITE NEGATIVE 10/28/2016 1929   LEUKOCYTESUR Negative 03/26/2024 1111    Lab Results  Component Value Date   LABMICR See below: 03/26/2024   WBCUA None seen 03/26/2024   LABEPIT 0-10 03/26/2024   BACTERIA None seen 03/26/2024    Pertinent Imaging: MRI 04/02/2024: Images reviewed and discussed with the patient  No results found for this or any previous visit.  No results found for this or  any previous visit.  No results found for this or any previous visit.  No results found for this or any previous visit.  No results found for this or any previous visit.  No results found for this or any previous visit.  No results found for this or any previous visit.  Results for orders placed during the hospital encounter of 10/23/16  CT RENAL STONE STUDY  Narrative CLINICAL DATA:  78 year old female with right flank pain. Swelling right abdomen. Prior fundoplication and cholecystectomy. Initial encounter.  EXAM: CT ABDOMEN AND PELVIS WITHOUT CONTRAST  TECHNIQUE: Multidetector CT imaging of the abdomen and pelvis was performed following the standard protocol without IV contrast.  COMPARISON:  10/01/2015 CT.  FINDINGS: Lower chest: Tiny calcified granuloma right middle lobe. Prominent mitral valve calcifications. Aortic valve calcification. Coronary artery calcification. Heart size within normal limits. Dense breast parenchyma contain coarse calcification on the left.  Hepatobiliary: Taking into account limitation by non contrast imaging, no worrisome hepatic lesion. Postcholecystectomy.  Pancreas: Taking into account limitation by non contrast imaging, no mass or  inflammation.  Spleen: Taking into account limitation by non contrast imaging, no mass or enlargement.  Adrenals/Urinary Tract: 3.9 x 2.2 x 2.4 cm complex left adrenal mass contains tiny calcifications. There has been a change in the density of a component of this lesion. This will require further evaluation with contrast-enhanced dedicated adrenal MR as malignancy cannot be excluded.  No right adrenal lesion.  No obstructing renal or ureteral calculi or evidence hydronephrosis. 6 cm right kidney with tiny peripheral calcifications without significant change. 7 mm slightly exophytic lesion posterior inferior aspect right kidney. It is possible this represents a hyperdense cyst although a solid mass not excluded. This can be assessed with dedicated MR.  Decompressed urinary bladder.  Stomach/Bowel: Moderately large hiatal hernia. Postsurgical changes.  Dilated fluid-filled loops of proximal to mid small bowel of indeterminate etiology possibly related to enteritis without point of obstruction identified. Low grade small bowel obstruction secondary less likely consideration.  Prominent colonic diverticula most notable left colon without extraluminal bowel inflammatory process, free fluid or free air.  Vascular/Lymphatic: Atherosclerotic changes aorta without aneurysmal dilation. Atherosclerotic changes iliac arteries.  No adenopathy.  Reproductive: Endometrium appears prominent.  No adnexal mass noted.  Other: No bowel containing hernia.  Musculoskeletal: Lumbar degenerative changes most notable L4-5.  IMPRESSION: Dilated fluid-filled loops of proximal to mid small bowel of indeterminate etiology possibly related to enteritis without point of obstruction identified. Low grade small bowel obstruction secondary less likely consideration.  Prominent colonic diverticula most notable left colon without extraluminal bowel inflammatory process, free fluid or free  air.  Moderately large hiatal hernia.  Postsurgical changes  No obstructing renal or ureteral calculi. 6 cm right renal cyst without change.  **An incidental finding of potential clinical significance has been found. 3.9 x 2.2 x 2.4 cm complex left adrenal mass contains tiny calcifications. There has been a change in the density of a component of this lesion. This will require further evaluation with contrast-enhanced dedicated adrenal MR as malignancy cannot be excluded.  **An incidental finding of potential clinical significance has been found. 7 mm slightly exophytic lesion posterior inferior aspect right kidney. It is possible this represents a hyperdense cyst although a solid mass not excluded. This can be assessed with dedicated MR.**  **An incidental finding of potential clinical significance has been found. Endometrial lining prominence as previously noted. Follow-up pelvic sonogram recommended.*   Electronically Signed By: Elspeth Slain M.D. On:  10/23/2016 06:46   Assessment & Plan:    1. Adrenal adenoma, unspecified laterality (Primary), Left The risks/benefits/alternatives to robotic left adrenalectomy was explained to the patient and she understands and wishes to proceed with surgery - Urinalysis, Routine w reflex microscopic   No follow-ups on file.  Belvie Clara, MD  [facility] [place]

## 2024-05-27 ENCOUNTER — Telehealth: Payer: Self-pay

## 2024-05-27 NOTE — Telephone Encounter (Signed)
   Name: Courtney Dorsey  DOB: [birth year]  MRN: [id number]  Primary Cardiologist: Diannah SHAUNNA Maywood, MD   Preoperative team, please contact this patient and set up a phone call appointment for further preoperative risk assessment. Please obtain consent and complete medication review. Thank you for your help.  I confirm that guidance regarding antiplatelet and oral anticoagulation therapy has been completed and, if necessary, noted below.  None requested.  I also confirmed the patient resides in the state of [place] . As per [facility] Medical Board telemedicine laws, the patient must reside in the state in which the provider is licensed.   Josefa CHRISTELLA Beauvais, NP 05/27/2024, 1:34 PM [facility]

## 2024-05-27 NOTE — Telephone Encounter (Signed)
   Pre-operative Risk Assessment    Patient Name: Courtney Dorsey  DOB: 01-Sep-1946 MRN: [id number]   Date of last office visit: 03/09/24 DIANNAH MAYWOOD, MD Date of next office visit: NONE   Request for Surgical Clearance    Procedure:  LEFT ADRENALECTOMY   Date of Surgery:  Clearance 06/21/24                                Surgeon:  DR SHERRILEE Surgeon's Group or Practice Name:  [facility]  Phone number:  [phone] Fax number:  [phone]   Type of Clearance Requested:   - Medical    Type of Anesthesia:  General    Additional requests/questions:    SignedLucie DELENA Ku   05/27/2024, 12:44 PM

## 2024-05-27 NOTE — Telephone Encounter (Signed)
 1st attempt : 05/27/24 Called patient, NA, left message to contact our office to schedule telehealth visit.

## 2024-05-28 ENCOUNTER — Telehealth: Payer: Self-pay

## 2024-05-28 NOTE — Telephone Encounter (Signed)
 Appointment scheduled.

## 2024-05-28 NOTE — Telephone Encounter (Signed)
  Patient Consent for Virtual Visit        Courtney Dorsey has provided verbal consent on 05/28/2024 for a virtual visit (video or telephone).   CONSENT FOR VIRTUAL VISIT FOR:  Courtney Dorsey  By participating in this virtual visit I agree to the following:  I hereby voluntarily request, consent and authorize [facility] and its employed or contracted physicians, physician assistants, nurse practitioners or other licensed health care professionals (the Practitioner), to provide me with telemedicine health care services (the "Services) as deemed necessary by the treating Practitioner. I acknowledge and consent to receive the Services by the Practitioner via telemedicine. I understand that the telemedicine visit will involve communicating with the Practitioner through live audiovisual communication technology and the disclosure of certain medical information by electronic transmission. I acknowledge that I have been given the opportunity to request an in-person assessment or other available alternative prior to the telemedicine visit and am voluntarily participating in the telemedicine visit.  I understand that I have the right to withhold or withdraw my consent to the use of telemedicine in the course of my care at any time, without affecting my right to future care or treatment, and that the Practitioner or I may terminate the telemedicine visit at any time. I understand that I have the right to inspect all information obtained and/or recorded in the course of the telemedicine visit and may receive copies of available information for a reasonable fee.  I understand that some of the potential risks of receiving the Services via telemedicine include:  Delay or interruption in medical evaluation due to technological equipment failure or disruption; Information transmitted may not be sufficient (e.g. poor resolution of images) to allow for appropriate medical decision making by the  Practitioner; and/or  In rare instances, security protocols could fail, causing a breach of personal health information.  Furthermore, I acknowledge that it is my responsibility to provide information about my medical history, conditions and care that is complete and accurate to the best of my ability. I acknowledge that Practitioner's advice, recommendations, and/or decision may be based on factors not within their control, such as incomplete or inaccurate data provided by me or distortions of diagnostic images or specimens that may result from electronic transmissions. I understand that the practice of medicine is not an exact science and that Practitioner makes no warranties or guarantees regarding treatment outcomes. I acknowledge that a copy of this consent can be made available to me via my patient portal [facility] MyChart), or I can request a printed copy by calling the office of [facility].    I understand that my insurance will be billed for this visit.   I have read or had this consent read to me. I understand the contents of this consent, which adequately explains the benefits and risks of the Services being provided via telemedicine.  I have been provided ample opportunity to ask questions regarding this consent and the Services and have had my questions answered to my satisfaction. I give my informed consent for the services to be provided through the use of telemedicine in my medical care

## 2024-05-28 NOTE — Telephone Encounter (Signed)
 Pt returning call to schedule. Please advise.

## 2024-06-07 ENCOUNTER — Ambulatory Visit: Attending: Cardiology | Admitting: Student

## 2024-06-07 DIAGNOSIS — Z01810 Encounter for preprocedural cardiovascular examination: Secondary | ICD-10-CM | POA: Diagnosis not present

## 2024-06-07 NOTE — Progress Notes (Signed)
 Virtual Visit via Telephone Note   Because of Courtney Dorsey's co-morbid illnesses, she is at least at moderate risk for complications without adequate follow up.  This format is felt to be most appropriate for this patient at this time.  The patient did not have access to video technology/had technical difficulties with video requiring transitioning to audio format only (telephone).  All issues noted in this document were discussed and addressed.  No physical exam could be performed with this format.  Please refer to the patient's chart for her consent to telehealth for [facility].  Evaluation Performed:  Preoperative cardiovascular risk assessment _____________   Date:  06/07/2024   Patient ID:  Courtney Dorsey, DOB August 23, 1946, MRN [id number] Patient Location:  Home Provider location:   Office  Primary Care Provider:  Marvine Rush, MD Primary Cardiologist:  Vishnu P Mallipeddi, MD  Chief Complaint / Patient Profile   78 y.o. y/o female with a h/o coronary artery calcification, aortic valve stenosis, hypertension, hyperlipidemia, iron deficiency anemia, T2DM, breast cancer, tobacco abuse who is pending left adrenalectomy by Dr. Sherrilee and presents today for telephonic preoperative cardiovascular risk assessment.  History of Present Illness    Courtney Dorsey is a 78 y.o. female who presents via audio/video conferencing for a telehealth visit today.  Pt was last seen in cardiology clinic on 03/09/2024 by Dr. Mallipeddi.  At that time Courtney Dorsey was stable from a cardiac standpoint.  The patient is now pending procedure as outlined above. Since her last visit, she is doing well. Patient denies shortness of breath, dyspnea on exertion, lower extremity edema, orthopnea or PND. No chest pain, pressure, or tightness. No palpitations. No lightheadedness, dizziness, presyncope or syncope. She does not walk for exercise like she used. She remains active doing light to moderate  household chores.   Past Medical History    Past Medical History:  Diagnosis Date   Anemia    Diabetes mellitus without complication (HCC)    BORDERLINE, off all medication   Diverticulosis    Heart murmur    childhood    History of hiatal hernia    History of radiation therapy 07/21/19- 09/02/19   Left Breast 25 fx of 2 Gy each to total 50 Gy. Left Breast boost 5 fx of 2 Gy each to total 10 Gy   History of skin cancer    History of transfusion    Hypercholesteremia    Hypertension    Personal history of radiation therapy    Skin cancer    UTI (lower urinary tract infection)    TAKING ANTIBIOTICS   Past Surgical History:  Procedure Laterality Date   BREAST LUMPECTOMY Left    BREAST LUMPECTOMY WITH RADIOACTIVE SEED AND AXILLARY LYMPH NODE DISSECTION Left 06/17/2019   Procedure: LEFT BREAST LUMPECTOMY X2  WITH RADIOACTIVE SEED X2 AND LEFT SEED TARGETED AXILLARY LYMPH NODE EXCISION AND SENTINEL LYMPH NODE EXCISION;  Surgeon: Ethyl Lenis, MD;  Location: [facility];  Service: General;  Laterality: Left;   CHOLECYSTECTOMY     COLONOSCOPY  2009   Dr. Oneil Budge   COLONOSCOPY N/A 01/01/2013   DOQ:Wnmfjo mucosa in the terminal ileum  Mild diverticulosis in the descending colon and sigmoid colon and sigmoid colon/Moderate sized internal hemorrhoids   ESOPHAGOGASTRODUODENOSCOPY N/A 01/01/2013   DOQ:Dfjoo hiatal hernia/ MODERATE SIZE PARA-ESOPHAGEAL HERNIA/MILD Erosive gastritis. chronic duodenitis c/w peptic duodenitis. no h.pylori or villous atrophy. minimal chronic gastritis.    ESOPHAGOGASTRODUODENOSCOPY N/A 02/16/2013  Procedure: ESOPHAGOGASTRODUODENOSCOPY (EGD);  Surgeon: Lamar CHRISTELLA Hollingshead, MD;  [facility];  Service: Endoscopy;  Laterality: N/A;  8:30   GIVENS CAPSULE STUDY N/A 02/16/2013   EGD with capsule placement. no evidence of paraesophageal hernia. SB essentially unremarkable.   HIATAL HERNIA REPAIR N/A 03/21/2017   Procedure: LAPAROSCOPIC lysis of  adhesions,reduction hiatal hernia, upper endoscopy,gastropexy;  Surgeon: Gladis Cough, MD;  [facility];  Service: General;  Laterality: N/A;   KNOT REMOVED FROM SCALP     LAPAROSCOPIC NISSEN FUNDOPLICATION N/A 10/10/2015   Procedure: LAPAROSCOPIC NISSEN FUNDOPLICATION AND HIATAL HERNIA REPAIR;  Surgeon: Cough Gladis, MD;  Location: [facility];  Service: General;  Laterality: N/A;    Allergies  No Known Allergies  Home Medications    Prior to Admission medications   Medication Sig Start Date End Date Taking? Authorizing Provider  acetaminophen  (TYLENOL ) 500 MG tablet Take 1,000 mg by mouth daily as needed for headache.    [provider]  anastrozole  (ARIMIDEX ) 1 MG tablet TAKE 1 TABLET EVERY DAY 08/11/23   Lanny Callander, MD  atorvastatin  (LIPITOR) 40 MG tablet Take 40 mg by mouth daily.    [provider]  Biotin 10 MG TABS Take 1 tablet by mouth daily.    [provider]  cholecalciferol (VITAMIN D3) 25 MCG (1000 UNIT) tablet Take 1,000 Units by mouth daily.    [provider]  cimetidine (TAGAMET) 200 MG tablet Take 200 mg by mouth 2 (two) times daily.    [provider]  losartan  (COZAAR ) 100 MG tablet Take 100 mg by mouth daily.    [provider]  ondansetron  (ZOFRAN -ODT) 4 MG disintegrating tablet Take 1 tablet (4 mg total) by mouth every 6 (six) hours as needed for nausea. 03/23/17   Gladis Cough, MD  pantoprazole  (PROTONIX ) 40 MG tablet Take 40 mg by mouth daily.    [provider]  Simethicone  (GAS-X PO) Take 1-2 tablets by mouth 2 (two) times daily as needed (stomach irritation).    [provider]    Physical Exam    Vital Signs:  Courtney Dorsey does not have vital signs available for review today.  Given telephonic nature of communication, physical exam is limited. AAOx3. NAD. Normal affect.  Speech and respirations are unlabored.   Assessment & Plan    Primary Cardiologist: Vishnu P Mallipeddi,  MD  Preoperative cardiovascular risk assessment.  Left adrenalectomy by Dr. Sherrilee on 06/21/2024  Chart reviewed as part of pre-operative protocol coverage. According to the RCRI, patient has a 0.4% risk of MACE. Patient reports activity equivalent to 5.07 METS (per [employer]ASI).   Given past medical history and time since last visit, based on ACC/AHA guidelines, Courtney Dorsey would be at acceptable risk for the planned procedure without further cardiovascular testing.   Patient was advised that if she develops new symptoms prior to surgery to contact our office to arrange a follow-up appointment.  she verbalized understanding.   I will route this recommendation to the requesting party via Epic fax function.  Please call with questions.  Time:   Today, I have spent 5 minutes with the patient with telehealth technology discussing medical history, symptoms, and management plan.     Barnie Hila, NP  06/07/2024, 8:07 AM

## 2024-06-15 NOTE — Patient Instructions (Signed)
 Your procedure is scheduled on June 21, 2024.  Report to [facility] Main Entrance at 6:00 A.M.   Call this number if you have problems the morning of surgery:  [phone]  If you experience any cold or flu symptoms such as cough, fever, chills, shortness of breath, etc. between now and your scheduled surgery, please notify us  at the above number.   Remember:  Do not eat or drink after midnight.     Take these medicines the morning of surgery with A SIP OF WATER    pantoprazole ,anastrozole ,TAGAMET    Use inhaler if needed.     Do not wear jewelry, make-up or nail polish, including gel polish,  artificial nails, or any other type of covering on natural nails.  Do not wear lotions, powders, or perfumes, or deodorant.  Do not shave 48 hours prior to surgery.    Do not bring valuables to the hospital.  [facility] is not responsible for any belongings or valuables.  Contacts, dentures or bridgework may not be worn into surgery.  Leave your suitcase in the car.  After surgery it may be brought to your room.  For patients admitted to the hospital, discharge time will be determined by your treatment team.  Patients discharged the day of surgery will not be allowed to drive home and must have someone be with them for 24 hours.   Special instructions: DO NOT SMOKE TOBACCO OR VAPE 24 HOURS PRIOR TO YOUR PROCEDURE.   Please brush your teeth morning of procedure.   Please read over the following: Anesthesia Post-op Instructions and Care and Recovery After Surgery  How to Use Chlorhexidine  at Home in the Shower Chlorhexidine  gluconate (CHG) is a germ-killing (antiseptic) wash that's used to clean the skin. It can get rid of the germs that normally live on the skin and can keep them away for about 24 hours. If you're having surgery, you may be told to shower with CHG at home the night before surgery. This can help lower your risk for infection. To use CHG wash in the  shower, follow the steps below. Supplies needed: CHG body wash. Clean washcloth. Clean towel. How to use CHG in the shower Follow these steps unless you're told to use CHG in a different way: Start the shower. Use your normal soap and shampoo to wash your face and hair. Turn off the shower or move out of the shower stream. Pour CHG onto a clean washcloth. Do not use any type of brush or rough sponge. Start at your neck, washing your body down to your toes. Make sure you: Wash the part of your body where the surgery will be done for at least 1 minute. Do not scrub. Do not use CHG on your head or face unless your health care provider tells you to. If it gets into your ears or eyes, rinse them well with water . Do not wash your genitals with CHG. Wash your back and under your arms. Make sure to wash skin folds. Let the CHG sit on your skin for 1-2 minutes or as long as told. Rinse your entire body in the shower, including all body creases and folds. Turn off the shower. Dry off with a clean towel. Do not put anything on your skin afterward, such as powder, lotion, or perfume. Put on clean clothes or pajamas. If it's the night before surgery, sleep in clean sheets. General tips Use CHG only as told, and follow the instructions on the label.  Use the full amount of CHG as told. This is often one bottle. Do not smoke and stay away from flames after using CHG. Your skin may feel sticky after using CHG. This is normal. The sticky feeling will go away as the CHG dries. Do not use CHG: If you have a chlorhexidine  allergy or have reacted to chlorhexidine  in the past. On open wounds or areas of skin that have broken skin, cuts, or scrapes. On babies younger than 18 months of age. Contact a health care provider if: You have questions about using CHG. Your skin gets irritated or itchy. You have a rash after using CHG. You swallow any CHG. Call your local poison control center [phone] in the  [place] Your eyes itch badly, or they become very red or swollen. Your hearing changes. You have trouble seeing. If you can't reach your provider, go to an urgent care or emergency room. Do not drive yourself. Get help right away if: You have swelling or tingling in your mouth or throat. You make high-pitched whistling sounds when you breathe, most often when you breathe out (wheeze). You have trouble breathing. These symptoms may be an emergency. Call 911 right away. Do not wait to see if the symptoms will go away. Do not drive yourself to the hospital. This information is not intended to replace advice given to you by your health care provider. Make sure you discuss any questions you have with your health care provider. Document Revised: 03/04/2023 Document Reviewed: 02/28/2022 Elsevier Patient Education  2024 [employer] Inc.  Adrenalectomy  Adrenal glands are organs that make several hormones that your body needs to work. You have two adrenal glands, one above each kidney. An adrenalectomy is a surgery to remove an adrenal gland. You may need this surgery if an adrenal gland is making too much hormone or if you have a tumor on your adrenal gland. There are two kinds of adrenalectomies: Laparoscopic. This is done through small cuts (incisions) with the help of a small, lighted, camera (laparoscope). Sometimes a robot can be used to control the camera and tools. This is called robot-assisted laparoscopic surgery. Open. This is done through one large incision. Tell a health care provider about: Any allergies you have. All medicines you are taking, including vitamins, herbs, eye drops, creams, and over-the-counter medicines. Any problems you or family members have had with anesthetic medicines. Any bleeding problems you have. Any surgeries you have had. Any medical conditions you have. If you are pregnant or may be pregnant. What are the risks? Your health care provider will talk to you  about risks. These may include: Infection. Bleeding. Allergic reactions to medicines. Damage to nearby structures or organs. High blood pressure (hypertension) or low blood pressure (hypotension). What happens before the procedure? When to stop eating and drinking Follow instructions from your health care provider about what you may eat and drink. These may include: 8 hours before your procedure Stop eating most foods. Do not eat meat, fried foods, or fatty foods. Eat only light foods, such as toast or crackers. All liquids are okay except energy drinks and alcohol. 6 hours before your procedure Stop eating. Drink only clear liquids, such as water , clear fruit juice, black coffee, plain tea, and sports drinks. Do not drink energy drinks or alcohol. 2 hours before your procedure Stop drinking all liquids. You may be allowed to take medicines with small sips of water . If you do not follow your health care provider's instructions, your procedure may  be delayed or canceled. Medicines Ask your health care provider about: Changing or stopping your regular medicines. These include any diabetes medicines or blood thinners you take. Taking medicines such as aspirin  and ibuprofen. These medicines can thin your blood. Do not take these medicines unless your health care provider tells you to. Taking over-the-counter medicines, vitamins, herbs, and supplements. Taking prescription medicines to lower the levels of hormones if the glands are producing too much of them. These hormones include cortisol, aldosterone, or adrenaline. General instructions Do not use any products that contain nicotine or tobacco for at least 4 weeks before the procedure. These products include cigarettes, chewing tobacco, and vaping devices, such as e-cigarettes. If you need help quitting, ask your health care provider. If you will be going home right after the procedure, plan to have a responsible adult: Take you home from  the hospital or clinic. You will not be allowed to drive. Care for you for the time you are told. Your health care provider may do blood tests and imaging tests. Ask your health care provider: How your surgical site will be marked or identified. What steps will be taken to help prevent infection. These may include: Removing hair at the surgery site. Washing skin with a soap that kills germs. Taking antibiotic medicine. What happens during the procedure? An IV will be inserted into one of your veins. You may be given: A sedative. This helps you relax. Anesthesia. This will: Numb certain areas of your body. Make you fall asleep for surgery. A small, thin tube (catheter) may be put into your bladder to drain urine. A tube may be passed through your nose or mouth and into your stomach (NG tube or nasogastric tube). This tube removes fluid from your stomach. Your surgeon will choose one of the following methods for your surgery. For laparoscopic adrenalectomy: Several small incisions will be made in your abdomen or one of your sides (flank). Surgical instruments (trocars) will be inserted into the small incisions. Your abdomen will be filled with carbon dioxide gas. This will make it easy for your surgeon to view the area more clearly and have more room for the surgery. A laparoscope and other small surgical instruments will be put through the trocars. The camera on the laparoscope will send a picture to a computer screen. A surgical robot may be used to control the laparoscope and instruments from a computer console. The images from the laparoscope will appear on the console. For open adrenalectomy, a large incision will be made under your rib cage, in the middle of your abdomen, or along your side. If needed, some patients have the incision in the middle of their lower back. The adrenal gland will be removed. Your incisions will be closed using stitches (sutures), staples, skin glue, or  adhesive strips. A bandage (dressing) will be used to cover the incision or incisions. The procedure may vary among health care providers and hospitals. What happens after the procedure? Your blood pressure, heart rate, breathing rate, and blood oxygen level will be monitored until you leave the hospital or clinic. If a tube was inserted into your bladder or stomach, it will be removed as soon as possible. You may have to wear compression stockings. These stockings help to prevent blood clots and reduce swelling in your legs. You will be given medicines: To treat pain. To regulate certain hormone levels. To control blood pressure. Where to find more information Society of [employer] Gastro[employer] End[employer] (SAGES): www.sages.org Summary  Adrenal glands are organs that make several hormones that your body needs to work. An adrenalectomy is a surgery to remove an adrenal gland. Follow your health care provider's instructions about eating and drinking, quitting smoking, and taking medicines before the procedure. Your surgery will be done in one of two ways: laparoscopic surgery or open surgery. You will be monitored closely after the surgery. You will also be given medicines to treat pain, regulate certain hormone levels, and control blood pressure. This information is not intended to replace advice given to you by your health care provider. Make sure you discuss any questions you have with your health care provider. Document Revised: 11/08/2021 Document Reviewed: 11/08/2021 Elsevier Patient Education  2024 Elsevier Inc.   General Anesthesia, Adult, Care After The following information offers guidance on how to care for yourself after your procedure. Your health care provider may also give you more specific instructions. If you have problems or questions, contact your health care provider. What can I expect after the procedure? After the procedure, it is common for people  to: Have pain or discomfort at the IV site. Have nausea or vomiting. Have a sore throat or hoarseness. Have trouble concentrating. Feel cold or chills. Feel weak, sleepy, or tired (fatigue). Have soreness and body aches. These can affect parts of the body that were not involved in surgery. Follow these instructions at home: For the time period you were told by your health care provider:  Rest. Do not participate in activities where you could fall or become injured. Do not drive or use machinery. Do not drink alcohol. Do not take sleeping pills or medicines that cause drowsiness. Do not make important decisions or sign legal documents. Do not take care of children on your own. General instructions Drink enough fluid to keep your urine pale yellow. If you have sleep apnea, surgery and certain medicines can increase your risk for breathing problems. Follow instructions from your health care provider about wearing your sleep device: Anytime you are sleeping, including during daytime naps. While taking prescription pain medicines, sleeping medicines, or medicines that make you drowsy. Return to your normal activities as told by your health care provider. Ask your health care provider what activities are safe for you. Take over-the-counter and prescription medicines only as told by your health care provider. Do not use any products that contain nicotine or tobacco. These products include cigarettes, chewing tobacco, and vaping devices, such as e-cigarettes. These can delay incision healing after surgery. If you need help quitting, ask your health care provider. Contact a health care provider if: You have nausea or vomiting that does not get better with medicine. You vomit every time you eat or drink. You have pain that does not get better with medicine. You cannot urinate or have bloody urine. You develop a skin rash. You have a fever. Get help right away if: You have trouble  breathing. You have chest pain. You vomit blood. These symptoms may be an emergency. Get help right away. Call 911. Do not wait to see if the symptoms will go away. Do not drive yourself to the hospital. Summary After the procedure, it is common to have a sore throat, hoarseness, nausea, vomiting, or to feel weak, sleepy, or fatigue. For the time period you were told by your health care provider, do not drive or use machinery. Get help right away if you have difficulty breathing, have chest pain, or vomit blood. These symptoms may be an emergency. This information is  not intended to replace advice given to you by your health care provider. Make sure you discuss any questions you have with your health care provider. Document Revised: 11/16/2021 Document Reviewed: 11/16/2021 Elsevier Patient Education  2024 [employer].

## 2024-06-17 ENCOUNTER — Other Ambulatory Visit: Payer: Self-pay

## 2024-06-17 ENCOUNTER — Encounter (HOSPITAL_COMMUNITY): Payer: Self-pay

## 2024-06-17 ENCOUNTER — Encounter (HOSPITAL_COMMUNITY)
Admission: RE | Admit: 2024-06-17 | Discharge: 2024-06-17 | Disposition: A | Discharge status: Home / Self Care | Source: Ambulatory Visit | Attending: Urology | Admitting: Urology

## 2024-06-17 VITALS — BP 124/88 | HR 81 | Resp 18 | Ht 61.0 in | Wt 128.7 lb

## 2024-06-17 DIAGNOSIS — I10 Essential (primary) hypertension: Secondary | ICD-10-CM | POA: Diagnosis not present

## 2024-06-17 DIAGNOSIS — E119 Type 2 diabetes mellitus without complications: Secondary | ICD-10-CM | POA: Insufficient documentation

## 2024-06-17 DIAGNOSIS — D3502 Benign neoplasm of left adrenal gland: Secondary | ICD-10-CM | POA: Insufficient documentation

## 2024-06-17 DIAGNOSIS — Z01818 Encounter for other preprocedural examination: Secondary | ICD-10-CM | POA: Diagnosis not present

## 2024-06-17 HISTORY — DX: Gastro-esophageal reflux disease without esophagitis: K21.9

## 2024-06-17 LAB — CBC
HCT: 47.9 % — ABNORMAL HIGH (ref 36.0–46.0)
Hemoglobin: 15.2 g/dL — ABNORMAL HIGH (ref 12.0–15.0)
MCH: 28.4 pg (ref 26.0–34.0)
MCHC: 31.7 g/dL (ref 30.0–36.0)
MCV: 89.4 fL (ref 80.0–100.0)
Platelets: 232 K/uL (ref 150–400)
RBC: 5.36 MIL/uL — ABNORMAL HIGH (ref 3.87–5.11)
RDW: 14.7 % (ref 11.5–15.5)
WBC: 10.4 K/uL (ref 4.0–10.5)
nRBC: 0 % (ref 0.0–0.2)

## 2024-06-17 LAB — BASIC METABOLIC PANEL WITH GFR
Anion gap: 9 (ref 5–15)
BUN: 19 mg/dL (ref 8–23)
CO2: 28 mmol/L (ref 22–32)
Calcium: 9.9 mg/dL (ref 8.9–10.3)
Chloride: 102 mmol/L (ref 98–111)
Creatinine, Ser: 0.9 mg/dL (ref 0.44–1.00)
GFR, Estimated: >60 mL/min (ref >60)
Glucose, Bld: 122 mg/dL — ABNORMAL HIGH (ref 70–99)
Potassium: 4.2 mmol/L (ref 3.5–5.1)
Sodium: 139 mmol/L (ref 135–145)

## 2024-06-21 ENCOUNTER — Encounter (HOSPITAL_COMMUNITY): Admission: RE | Disposition: A | Payer: Self-pay | Source: Home / Self Care | Attending: Urology

## 2024-06-21 ENCOUNTER — Inpatient Hospital Stay (HOSPITAL_COMMUNITY)
Admission: RE | Admit: 2024-06-21 | Discharge: 2024-06-22 | DRG: 615 | Disposition: A | Discharge status: Home / Self Care | Attending: Urology | Admitting: Urology

## 2024-06-21 ENCOUNTER — Encounter (HOSPITAL_COMMUNITY): Payer: Self-pay | Admitting: Urology

## 2024-06-21 ENCOUNTER — Inpatient Hospital Stay (HOSPITAL_COMMUNITY): Payer: Self-pay | Admitting: Certified Registered"

## 2024-06-21 ENCOUNTER — Other Ambulatory Visit: Payer: Self-pay

## 2024-06-21 DIAGNOSIS — F17210 Nicotine dependence, cigarettes, uncomplicated: Secondary | ICD-10-CM

## 2024-06-21 DIAGNOSIS — Z9049 Acquired absence of other specified parts of digestive tract: Secondary | ICD-10-CM

## 2024-06-21 DIAGNOSIS — I2510 Atherosclerotic heart disease of native coronary artery without angina pectoris: Secondary | ICD-10-CM

## 2024-06-21 DIAGNOSIS — Z79811 Long term (current) use of aromatase inhibitors: Secondary | ICD-10-CM | POA: Diagnosis not present

## 2024-06-21 DIAGNOSIS — E278 Other specified disorders of adrenal gland: Secondary | ICD-10-CM | POA: Diagnosis present

## 2024-06-21 DIAGNOSIS — Z803 Family history of malignant neoplasm of breast: Secondary | ICD-10-CM | POA: Diagnosis not present

## 2024-06-21 DIAGNOSIS — Z833 Family history of diabetes mellitus: Secondary | ICD-10-CM | POA: Diagnosis not present

## 2024-06-21 DIAGNOSIS — D3502 Benign neoplasm of left adrenal gland: Secondary | ICD-10-CM | POA: Diagnosis not present

## 2024-06-21 DIAGNOSIS — E2749 Other adrenocortical insufficiency: Secondary | ICD-10-CM | POA: Diagnosis not present

## 2024-06-21 DIAGNOSIS — E7800 Pure hypercholesterolemia, unspecified: Secondary | ICD-10-CM | POA: Diagnosis present

## 2024-06-21 DIAGNOSIS — Z85828 Personal history of other malignant neoplasm of skin: Secondary | ICD-10-CM

## 2024-06-21 DIAGNOSIS — E119 Type 2 diabetes mellitus without complications: Secondary | ICD-10-CM | POA: Diagnosis not present

## 2024-06-21 DIAGNOSIS — K219 Gastro-esophageal reflux disease without esophagitis: Secondary | ICD-10-CM | POA: Diagnosis not present

## 2024-06-21 DIAGNOSIS — Z800 Family history of malignant neoplasm of digestive organs: Secondary | ICD-10-CM

## 2024-06-21 DIAGNOSIS — Z853 Personal history of malignant neoplasm of breast: Secondary | ICD-10-CM | POA: Diagnosis not present

## 2024-06-21 DIAGNOSIS — Z923 Personal history of irradiation: Secondary | ICD-10-CM | POA: Diagnosis not present

## 2024-06-21 DIAGNOSIS — I10 Essential (primary) hypertension: Secondary | ICD-10-CM

## 2024-06-21 DIAGNOSIS — Z79899 Other long term (current) drug therapy: Secondary | ICD-10-CM

## 2024-06-21 HISTORY — PX: ROBOTIC ADRENALECTOMY: SHX6407

## 2024-06-21 LAB — CBC
HCT: 47.5 % — ABNORMAL HIGH (ref 36.0–46.0)
Hemoglobin: 15.1 g/dL — ABNORMAL HIGH (ref 12.0–15.0)
MCH: 28.5 pg (ref 26.0–34.0)
MCHC: 31.8 g/dL (ref 30.0–36.0)
MCV: 89.8 fL (ref 80.0–100.0)
Platelets: 194 K/uL (ref 150–400)
RBC: 5.29 MIL/uL — ABNORMAL HIGH (ref 3.87–5.11)
RDW: 14.5 % (ref 11.5–15.5)
WBC: 11.4 K/uL — ABNORMAL HIGH (ref 4.0–10.5)
nRBC: 0 % (ref 0.0–0.2)

## 2024-06-21 LAB — BASIC METABOLIC PANEL WITH GFR
Anion gap: 7 (ref 5–15)
BUN: 16 mg/dL (ref 8–23)
CO2: 29 mmol/L (ref 22–32)
Calcium: 8.6 mg/dL — ABNORMAL LOW (ref 8.9–10.3)
Chloride: 105 mmol/L (ref 98–111)
Creatinine, Ser: 0.79 mg/dL (ref 0.44–1.00)
GFR, Estimated: >60 mL/min (ref >60)
Glucose, Bld: 144 mg/dL — ABNORMAL HIGH (ref 70–99)
Potassium: 4.3 mmol/L (ref 3.5–5.1)
Sodium: 140 mmol/L (ref 135–145)

## 2024-06-21 LAB — MRSA NEXT GEN BY PCR, NASAL: MRSA by PCR Next Gen: NOT DETECTED

## 2024-06-21 SURGERY — ADRENALECTOMY, ROBOT-ASSISTED
Anesthesia: General | Site: Abdomen | Laterality: Left

## 2024-06-21 MED ORDER — ASPIRIN 81 MG PO TBEC
81.0000 mg | DELAYED_RELEASE_TABLET | Freq: Every day | ORAL | Status: DC
  Administered 2024-06-21 – 2024-06-22 (×2): 81 mg via ORAL
  Filled 2024-06-21 (×2): qty 1

## 2024-06-21 MED ORDER — ORAL CARE MOUTH RINSE: 15.0000 mL | Freq: Once | OROMUCOSAL | Status: DC

## 2024-06-21 MED ORDER — ATORVASTATIN CALCIUM 40 MG PO TABS
40.0000 mg | ORAL_TABLET | Freq: Every day | ORAL | Status: DC
  Administered 2024-06-21 – 2024-06-22 (×2): 40 mg via ORAL
  Filled 2024-06-21 (×2): qty 1

## 2024-06-21 MED ORDER — CHLORHEXIDINE GLUCONATE CLOTH 2 % EX PADS
6.0000 | MEDICATED_PAD | Freq: Every day | CUTANEOUS | Status: DC
  Administered 2024-06-21 – 2024-06-22 (×2): 6 via TOPICAL

## 2024-06-21 MED ORDER — HEMOSTATIC AGENTS (NO CHARGE) OPTIME
TOPICAL | Status: DC | PRN
Start: 2024-06-21 — End: 2024-06-21
  Administered 2024-06-21: 1 via TOPICAL

## 2024-06-21 MED ORDER — LIDOCAINE 2% (20 MG/ML) 5 ML SYRINGE
INTRAMUSCULAR | Status: DC | PRN
  Administered 2024-06-21: 60 mg via INTRAVENOUS

## 2024-06-21 MED ORDER — DEXAMETHASONE SOD PHOSPHATE PF 10 MG/ML IJ SOLN
INTRAMUSCULAR | Status: DC | PRN
  Administered 2024-06-21: 5 mg via INTRAVENOUS

## 2024-06-21 MED ORDER — ZOLPIDEM TARTRATE 5 MG PO TABS: 5.0000 mg | ORAL_TABLET | Freq: Every evening | ORAL | Status: DC | PRN

## 2024-06-21 MED ORDER — PHENYLEPHRINE HCL-NACL 20-0.9 MG/250ML-% IV SOLN
INTRAVENOUS | Status: AC
  Filled 2024-06-21: qty 250

## 2024-06-21 MED ORDER — SENNOSIDES-DOCUSATE SODIUM 8.6-50 MG PO TABS
2.0000 | ORAL_TABLET | Freq: Every day | ORAL | Status: DC
  Administered 2024-06-21: 2 via ORAL
  Filled 2024-06-21 (×2): qty 2

## 2024-06-21 MED ORDER — DIPHENHYDRAMINE HCL 12.5 MG/5ML PO ELIX: 12.5000 mg | ORAL_SOLUTION | Freq: Four times a day (QID) | ORAL | Status: DC | PRN

## 2024-06-21 MED ORDER — FENTANYL CITRATE (PF) 100 MCG/2ML IJ SOLN
INTRAMUSCULAR | Status: AC
  Filled 2024-06-21: qty 2

## 2024-06-21 MED ORDER — SUGAMMADEX SODIUM 200 MG/2ML IV SOLN
INTRAVENOUS | Status: DC | PRN
  Administered 2024-06-21: 50 mg via INTRAVENOUS
  Administered 2024-06-21: 250 mg via INTRAVENOUS

## 2024-06-21 MED ORDER — ONDANSETRON HCL 4 MG/2ML IJ SOLN: 4.0000 mg | Freq: Once | INTRAMUSCULAR | Status: DC | PRN

## 2024-06-21 MED ORDER — CHLORHEXIDINE GLUCONATE 0.12 % MT SOLN: 15.0000 mL | Freq: Once | OROMUCOSAL | Status: DC

## 2024-06-21 MED ORDER — ACETAMINOPHEN 10 MG/ML IV SOLN
INTRAVENOUS | Status: AC
  Filled 2024-06-21: qty 100

## 2024-06-21 MED ORDER — ONDANSETRON HCL 4 MG/2ML IJ SOLN
INTRAMUSCULAR | Status: DC | PRN
  Administered 2024-06-21: 4 mg via INTRAVENOUS

## 2024-06-21 MED ORDER — SODIUM CHLORIDE 0.9 % IV SOLN: INTRAVENOUS | Status: AC

## 2024-06-21 MED ORDER — LABETALOL HCL 5 MG/ML IV SOLN
INTRAVENOUS | Status: AC
  Filled 2024-06-21: qty 4

## 2024-06-21 MED ORDER — ONDANSETRON HCL 4 MG/2ML IJ SOLN: 4.0000 mg | INTRAMUSCULAR | Status: DC | PRN

## 2024-06-21 MED ORDER — HYDROMORPHONE HCL 1 MG/ML IJ SOLN
INTRAMUSCULAR | Status: AC
  Filled 2024-06-21: qty 0.5

## 2024-06-21 MED ORDER — ACETAMINOPHEN 10 MG/ML IV SOLN
INTRAVENOUS | Status: DC | PRN
  Administered 2024-06-21: 1000 mg via INTRAVENOUS

## 2024-06-21 MED ORDER — DEXMEDETOMIDINE HCL IN NACL 80 MCG/20ML IV SOLN
INTRAVENOUS | Status: AC
  Filled 2024-06-21: qty 20

## 2024-06-21 MED ORDER — OXYCODONE HCL 5 MG PO TABS
5.0000 mg | ORAL_TABLET | ORAL | Status: DC | PRN
  Administered 2024-06-21 – 2024-06-22 (×3): 5 mg via ORAL
  Filled 2024-06-21 (×3): qty 1

## 2024-06-21 MED ORDER — FENTANYL CITRATE (PF) 100 MCG/2ML IJ SOLN
INTRAMUSCULAR | Status: DC | PRN
  Administered 2024-06-21 (×2): 50 ug via INTRAVENOUS

## 2024-06-21 MED ORDER — OXYCODONE HCL 5 MG PO TABS: 5.0000 mg | ORAL_TABLET | Freq: Once | ORAL | Status: DC | PRN

## 2024-06-21 MED ORDER — LOSARTAN POTASSIUM 50 MG PO TABS
100.0000 mg | ORAL_TABLET | Freq: Every day | ORAL | Status: DC
  Administered 2024-06-21 – 2024-06-22 (×2): 100 mg via ORAL
  Filled 2024-06-21 (×2): qty 2

## 2024-06-21 MED ORDER — CEFAZOLIN SODIUM-DEXTROSE 2-4 GM/100ML-% IV SOLN
INTRAVENOUS | Status: AC
  Filled 2024-06-21: qty 100

## 2024-06-21 MED ORDER — BUPIVACAINE HCL (PF) 0.25 % IJ SOLN
INTRAMUSCULAR | Status: AC
  Filled 2024-06-21: qty 30

## 2024-06-21 MED ORDER — LACTATED RINGERS IV SOLN: INTRAVENOUS | Status: DC | PRN

## 2024-06-21 MED ORDER — LACTATED RINGERS IV SOLN: INTRAVENOUS | Status: DC

## 2024-06-21 MED ORDER — CEFAZOLIN SODIUM-DEXTROSE 2-4 GM/100ML-% IV SOLN
2.0000 g | Freq: Three times a day (TID) | INTRAVENOUS | Status: AC
  Administered 2024-06-21 (×2): 2 g via INTRAVENOUS
  Filled 2024-06-21 (×2): qty 100

## 2024-06-21 MED ORDER — DIPHENHYDRAMINE HCL 50 MG/ML IJ SOLN: 12.5000 mg | Freq: Four times a day (QID) | INTRAMUSCULAR | Status: DC | PRN

## 2024-06-21 MED ORDER — HYDROMORPHONE HCL 1 MG/ML IJ SOLN: 0.5000 mg | INTRAMUSCULAR | Status: DC | PRN

## 2024-06-21 MED ORDER — ACETAMINOPHEN 325 MG PO TABS
650.0000 mg | ORAL_TABLET | ORAL | Status: DC | PRN
  Administered 2024-06-22: 650 mg via ORAL
  Filled 2024-06-21: qty 2

## 2024-06-21 MED ORDER — ESMOLOL HCL 100 MG/10ML IV SOLN
INTRAVENOUS | Status: DC | PRN
  Administered 2024-06-21 (×2): 20 mg via INTRAVENOUS
  Administered 2024-06-21: 10 mg via INTRAVENOUS

## 2024-06-21 MED ORDER — BUPIVACAINE HCL (PF) 0.5 % IJ SOLN
INTRAMUSCULAR | Status: DC | PRN
  Administered 2024-06-21: 20 mL

## 2024-06-21 MED ORDER — ANASTROZOLE 1 MG PO TABS
1.0000 mg | ORAL_TABLET | Freq: Every day | ORAL | Status: DC
  Administered 2024-06-22: 1 mg via ORAL
  Filled 2024-06-21 (×2): qty 1

## 2024-06-21 MED ORDER — PROPOFOL 1000 MG/100ML IV EMUL
INTRAVENOUS | Status: AC
  Filled 2024-06-21: qty 400

## 2024-06-21 MED ORDER — FENTANYL CITRATE (PF) 50 MCG/ML IJ SOSY: 25.0000 ug | PREFILLED_SYRINGE | INTRAMUSCULAR | Status: DC | PRN

## 2024-06-21 MED ORDER — HYDROMORPHONE HCL 1 MG/ML IJ SOLN
INTRAMUSCULAR | Status: DC | PRN
  Administered 2024-06-21 (×2): .5 mg via INTRAVENOUS

## 2024-06-21 MED ORDER — DEXMEDETOMIDINE HCL IN NACL 80 MCG/20ML IV SOLN
INTRAVENOUS | Status: DC | PRN
Start: 2024-06-21 — End: 2024-06-21
  Administered 2024-06-21: 8 ug via INTRAVENOUS

## 2024-06-21 MED ORDER — PROPOFOL 500 MG/50ML IV EMUL
INTRAVENOUS | Status: DC | PRN
  Administered 2024-06-21: 125 mg via INTRAVENOUS
  Administered 2024-06-21: 40 mg via INTRAVENOUS
  Administered 2024-06-21: 100 ug/kg/min via INTRAVENOUS
  Administered 2024-06-21: 160 mg via INTRAVENOUS

## 2024-06-21 MED ORDER — STERILE WATER FOR IRRIGATION IR SOLN
Status: DC | PRN
  Administered 2024-06-21: 500 mL

## 2024-06-21 MED ORDER — CHLORHEXIDINE GLUCONATE 0.12 % MT SOLN
15.0000 mL | Freq: Once | OROMUCOSAL | Status: AC
  Administered 2024-06-21: 15 mL via OROMUCOSAL

## 2024-06-21 MED ORDER — PANTOPRAZOLE SODIUM 40 MG PO TBEC
40.0000 mg | DELAYED_RELEASE_TABLET | Freq: Every day | ORAL | Status: DC
  Administered 2024-06-22: 40 mg via ORAL
  Filled 2024-06-21: qty 1

## 2024-06-21 MED ORDER — OXYCODONE HCL 5 MG/5ML PO SOLN: 5.0000 mg | Freq: Once | ORAL | Status: DC | PRN

## 2024-06-21 MED ORDER — CEFAZOLIN SODIUM-DEXTROSE 2-4 GM/100ML-% IV SOLN
2.0000 g | INTRAVENOUS | Status: AC
  Administered 2024-06-21: 2 g via INTRAVENOUS

## 2024-06-21 MED ORDER — ROCURONIUM BROMIDE 10 MG/ML (PF) SYRINGE
PREFILLED_SYRINGE | INTRAVENOUS | Status: DC | PRN
  Administered 2024-06-21: 50 mg via INTRAVENOUS
  Administered 2024-06-21: 60 mg via INTRAVENOUS

## 2024-06-21 MED ORDER — OXYBUTYNIN CHLORIDE 5 MG PO TABS: 5.0000 mg | ORAL_TABLET | Freq: Three times a day (TID) | ORAL | Status: DC | PRN

## 2024-06-21 SURGICAL SUPPLY — 49 items
BAG LAPAROSCOPIC 12 15 PORT 16 (BASKET) ×2 IMPLANT
BLADE SURG 15 STRL LF DISP TIS (BLADE) ×2 IMPLANT
CHLORAPREP W/TINT 26 (MISCELLANEOUS) ×2 IMPLANT
CLIP APPLIE XI LRG REUSE DVNC (INSTRUMENTS) IMPLANT
CLIP LIGATING HEM O LOK PURPLE (MISCELLANEOUS) ×4 IMPLANT
COVER LIGHT HANDLE STERIS (MISCELLANEOUS) ×2 IMPLANT
COVER MAYO STAND XLG (MISCELLANEOUS) ×2 IMPLANT
COVER TIP SHEARS 8 DVNC (MISCELLANEOUS) ×2 IMPLANT
DRAPE ARM DVNC X/XI (DISPOSABLE) ×8 IMPLANT
DRAPE COLUMN DVNC XI (DISPOSABLE) ×2 IMPLANT
DRAPE HALF SHEET 40X57 (DRAPES) ×2 IMPLANT
DRAPE INCISE IOBAN 66X45 STRL (DRAPES) ×2 IMPLANT
DRSG TEGADERM 2-3/8X2-3/4 SM (GAUZE/BANDAGES/DRESSINGS) IMPLANT
DRSG TEGADERM 4X4.75 (GAUZE/BANDAGES/DRESSINGS) IMPLANT
ELECT PENCIL ROCKER SW 15FT (MISCELLANEOUS) ×2 IMPLANT
ELECT REM PT RETURN 15FT ADLT (MISCELLANEOUS) ×2 IMPLANT
FORCEPS BPLR FENES DVNC XI (FORCEP) ×2 IMPLANT
FORCEPS PROGRASP DVNC XI (FORCEP) ×2 IMPLANT
GAUZE SPONGE 4X4 12PLY STRL (GAUZE/BANDAGES/DRESSINGS) IMPLANT
GLOVE BIO SURGEON STRL SZ8 (GLOVE) ×4 IMPLANT
GLOVE BIOGEL PI IND STRL 7.0 (GLOVE) ×8 IMPLANT
GLOVE BIOGEL PI IND STRL 8 (GLOVE) ×4 IMPLANT
GOWN STRL REUS W/TWL LRG LVL3 (GOWN DISPOSABLE) ×4 IMPLANT
GOWN STRL REUS W/TWL XL LVL3 (GOWN DISPOSABLE) ×4 IMPLANT
IRRIGATION SUCT STRKRFLW 2 WTP (MISCELLANEOUS) ×2 IMPLANT
KIT TURNOVER KIT A (KITS) IMPLANT
NDL HYPO 21X1.5 SAFETY (NEEDLE) ×2 IMPLANT
NDL INSUFFLATION 14GA 120MM (NEEDLE) ×2 IMPLANT
NEEDLE HYPO 21X1.5 SAFETY (NEEDLE) ×1 IMPLANT
NEEDLE INSUFFLATION 14GA 120MM (NEEDLE) ×1 IMPLANT
PACK LAP CHOLE LZT030E (CUSTOM PROCEDURE TRAY) ×2 IMPLANT
PAD ARMBOARD POSITIONER FOAM (MISCELLANEOUS) ×6 IMPLANT
POSITIONER HEAD 8X9X4 ADT (SOFTGOODS) ×2 IMPLANT
POWDER SURGICEL 3.0 GRAM (HEMOSTASIS) IMPLANT
SCISSORS MNPLR CVD DVNC XI (INSTRUMENTS) ×2 IMPLANT
SEAL UNIV 5-12 XI (MISCELLANEOUS) ×8 IMPLANT
SET BASIN LINEN APH (SET/KITS/TRAYS/PACK) ×2 IMPLANT
SET TUBE DA VINCI INSUFFLATOR (TUBING) IMPLANT
SOLN STERILE WATER 1000 ML (IV SOLUTION) ×1 IMPLANT
SOLN STERILE WATER BTL 1000 ML (IV SOLUTION) ×2 IMPLANT
STAPLER VISISTAT (STAPLE) IMPLANT
SUT MNCRL AB 4-0 PS2 18 (SUTURE) IMPLANT
SUT PDS AB 0 CTX 60 (SUTURE) ×2 IMPLANT
SUT VIC AB 2-0 SH 27X BRD (SUTURE) ×2 IMPLANT
SUT VICRYL 0 UR6 27IN ABS (SUTURE) IMPLANT
SYR 20ML LL LF (SYRINGE) ×4 IMPLANT
SYSTEM RETRIEVL 5MM INZII UNIV (BASKET) IMPLANT
TIP ENDOSCOPIC SURGICEL (TIP) IMPLANT
TRAY FOLEY MTR SLVR 16FR STAT (SET/KITS/TRAYS/PACK) ×2 IMPLANT

## 2024-06-21 NOTE — Anesthesia Preprocedure Evaluation (Signed)
 Anesthesia Evaluation  Patient identified by MRN, date of birth, ID band Patient awake    Reviewed: Allergy & Precautions, H&P , NPO status , Patient's Chart, lab work & pertinent test results, reviewed documented beta blocker date and time   Airway Mallampati: II  TM Distance: >3 FB Neck ROM: full    Dental no notable dental hx.    Pulmonary neg pulmonary ROS, Current Smoker   Pulmonary exam normal breath sounds clear to auscultation       Cardiovascular Exercise Tolerance: Good hypertension, + CAD  + Valvular Problems/Murmurs  Rhythm:regular Rate:Normal     Neuro/Psych  PSYCHIATRIC DISORDERS       Neuromuscular disease    GI/Hepatic Neg liver ROS, hiatal hernia,GERD  ,,  Endo/Other  diabetes    Renal/GU negative Renal ROS  negative genitourinary   Musculoskeletal   Abdominal   Peds  Hematology  (+) Blood dyscrasia, anemia   Anesthesia Other Findings Apparently, tumor is inactive from an endocrine standpoint.  Unine metanephrines negative.  Reproductive/Obstetrics negative OB ROS                              Anesthesia Physical Anesthesia Plan  ASA: 3  Anesthesia Plan: General and General ETT   Post-op Pain Management:    Induction:   PONV Risk Score and Plan: Ondansetron   Airway Management Planned:   Additional Equipment:   Intra-op Plan:   Post-operative Plan:   Informed Consent: I have reviewed the patients History and Physical, chart, labs and discussed the procedure including the risks, benefits and alternatives for the proposed anesthesia with the patient or authorized representative who has indicated his/her understanding and acceptance.     Dental Advisory Given  Plan Discussed with: CRNA  Anesthesia Plan Comments:         Anesthesia Quick Evaluation

## 2024-06-21 NOTE — Anesthesia Procedure Notes (Signed)
 Procedure Name: Intubation Date/Time: 06/21/2024 7:55 AM  Performed by: Para Jerelene CROME, CRNAPre-anesthesia Checklist: Patient identified, Emergency Drugs available, Suction available and Patient being monitored Patient Re-evaluated:Patient Re-evaluated prior to induction Oxygen Delivery Method: Circle system utilized Preoxygenation: Pre-oxygenation with 100% oxygen Induction Type: IV induction Ventilation: Mask ventilation without difficulty Laryngoscope Size: Mac and 4 Grade View: Grade III Tube type: Oral Tube size: 7.0 mm Number of attempts: 1 Airway Equipment and Method: Stylet Placement Confirmation: ETT inserted through vocal cords under direct vision, CO2 detector, breath sounds checked- equal and bilateral and positive ETCO2 Secured at: 22 (OETT postioned 22 cm at lower lip.) cm Tube secured with: Tape Dental Injury: Teeth and Oropharynx as per pre-operative assessment  Comments: Direct laryngoscopy x1 by Waddell Mull, SRNA. Atraumatic intubation by B. Darsh Vandevoort, Scientist, clinical (histocompatibility and immunogenetics). Teeth remain in preoperative condition. Abrasion noted to left upper lip.

## 2024-06-21 NOTE — Op Note (Signed)
 Preoperative diagnosis: Left adrenal mass  Postop diagnosis: Same  Procedure: 1.  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  Indications: Patient is a 78 year old with a history of 5 cm left adrenal mass that is growing in size and was not hormone active. After discussing treatment options patient decided to proceed with left robot assisted laparoscopic radical adrenalectomy.  Procedure in detail: Prior to procedure consent was obtained. Patient was brought to the operating room and briefing was done sure correct patient, correct procedure, correct site.  General anesthesia was in administered patient was placed in the right lateral decubitus position.  a 16 French catheter was placed. their abdomen and flank was then prepped and draped usual sterile fashion.  A Veress needle was used to obtain pneumoperitoneum.  Once pneumoperitoneum was reestablished to 15 mmHg we then placed a 12 mm camera port lateral to the umbilicus at the latera; edge of rectus.  We then proceeded to place 3 more robotic ports. We then placed an assistant port. We then docked the robot.  We then started this dissection by removing a small amount of anterior abdominal wall adhesions.  We then dissected along the white line of Toldt.  We then reflected the colon medially.  We then identified the upper pole of the kidney and defatted the upper pole to identify the adrenal gland.  We then dissected the peri-adrenal fat and identified 3 veins which were ligated with  hem-o-lock clips. We inspected the adrenal bed and noted no residual bleeding. We then used a Endo Catch bag to remove the specimen.  Once the specimen was in the Endo Catch bag we  then inspected the retroperitoneum and noted no residual bleeding.  We then removed our instruments, undocked the robot, and released the pneumoperitoneum.  We then made a midline incision above the umbilicus to remove the specimen.  Once the specimen was removed we then closed the camera and assistant ports with 0 Vicryl in interrupted fashion.  We then closed the midline incision with interrupted 0 vicryl.  We then closed the overlying skin with staples.  This concluded the procedure which resulted by the patient. The assistant was utilized for retraction, suction, and closing the incisions.   Complications: None  Condition: Stable, extubated, transferred to PACU.  Plan: Patient is to be admitted for inpatient stay. The foley catheter will be removed in the morning. They will be started on a clear liquid diet POD#1

## 2024-06-21 NOTE — Anesthesia Postprocedure Evaluation (Signed)
 Anesthesia Post Note  Patient: Courtney Dorsey  Procedure(s) Performed: ADRENALECTOMY, ROBOT-ASSISTED (Left: Abdomen)  Patient location during evaluation: Phase II Anesthesia Type: General Level of consciousness: awake Pain management: pain level controlled Vital Signs Assessment: post-procedure vital signs reviewed and stable Respiratory status: spontaneous breathing and respiratory function stable Cardiovascular status: blood pressure returned to baseline and stable Postop Assessment: no headache and no apparent nausea or vomiting Anesthetic complications: no Comments: Late entry   No notable events documented.   Last Vitals:  Vitals:   06/21/24 1400 06/21/24 1430  BP: (!) 128/54 (!) 135/59  Pulse: 71 77  Resp: 12 14  Temp:    SpO2: 92% 94%    Last Pain:  Vitals:   06/21/24 1134  TempSrc: Oral  PainSc:                  Yvonna JINNY Bosworth

## 2024-06-21 NOTE — H&P (Signed)
 HPI: Courtney Dorsey is a 78yo here for followup for a left adrenal mass. She has a hx of breast cancer treated 04/2019. [name] She underwent repeat MRI which showed increase in left adrenal tumor from 2.5cm to 4.1cm. Plasma metanephrins were normal. No weight gain or weight loss. No significant LUTS     PMH:     Past Medical History:  Diagnosis Date   Anemia     Diabetes mellitus without complication (HCC)      BORDERLINE, off all medication   Diverticulosis     Heart murmur      childhood    History of hiatal hernia     History of radiation therapy 07/21/19- 09/02/19    Left Breast 25 fx of 2 Gy each to total 50 Gy. Left Breast boost 5 fx of 2 Gy each to total 10 Gy   History of skin cancer     History of transfusion     Hypercholesteremia     Hypertension     Personal history of radiation therapy     Skin cancer     UTI (lower urinary tract infection)      TAKING ANTIBIOTICS          Surgical History:      Past Surgical History:  Procedure Laterality Date   BREAST LUMPECTOMY Left     BREAST LUMPECTOMY WITH RADIOACTIVE SEED AND AXILLARY LYMPH NODE DISSECTION Left 06/17/2019    Procedure: LEFT BREAST LUMPECTOMY X2  WITH RADIOACTIVE SEED X2 AND LEFT SEED TARGETED AXILLARY LYMPH NODE EXCISION AND SENTINEL LYMPH NODE EXCISION;  Surgeon: Ethyl Lenis, MD;  [facility]:  [facility];  Service: General;  Laterality: Left;   CHOLECYSTECTOMY       COLONOSCOPY   2009    Dr. Oneil Budge   COLONOSCOPY N/A 01/01/2013    DOQ:Wnmfjo mucosa in the terminal ileum  Mild diverticulosis in the descending colon and sigmoid colon and sigmoid colon/Moderate sized internal hemorrhoids   ESOPHAGOGASTRODUODENOSCOPY N/A 01/01/2013    DOQ:Dfjoo hiatal hernia/ MODERATE SIZE PARA-ESOPHAGEAL HERNIA/MILD Erosive gastritis. chronic duodenitis c/w peptic duodenitis. no h.pylori or villous atrophy. minimal chronic gastritis.    ESOPHAGOGASTRODUODENOSCOPY N/A 02/16/2013    Procedure:  ESOPHAGOGASTRODUODENOSCOPY (EGD);  Surgeon: Lamar CHRISTELLA Hollingshead, MD;  Location: [facility];  Service: Endoscopy;  Laterality: N/A;  8:30   GIVENS CAPSULE STUDY N/A 02/16/2013    EGD with capsule placement. no evidence of paraesophageal hernia. SB essentially unremarkable.   HIATAL HERNIA REPAIR N/A 03/21/2017    Procedure: LAPAROSCOPIC lysis of adhesions,reduction hiatal hernia, upper endoscopy,gastropexy;  Surgeon: Gladis Cough, MD;  Location: [facility];  Service: General;  Laterality: N/A;   KNOT REMOVED FROM SCALP       LAPAROSCOPIC NISSEN FUNDOPLICATION N/A 10/10/2015    Procedure: LAPAROSCOPIC NISSEN FUNDOPLICATION AND HIATAL HERNIA REPAIR;  Surgeon: Cough Gladis, MD;  Location: [facility];  Service: General;  Laterality: N/A;          Home Medications:  Allergies as of 05/24/2024   No Known Allergies         Medication List           Accurate as of May 24, 2024  9:21 AM. If you have any questions, ask your nurse or doctor.              acetaminophen  500 MG tablet Commonly known as: TYLENOL  Take 1,000 mg by mouth daily as needed for headache.    anastrozole  1 MG tablet Commonly known as:  ARIMIDEX  TAKE 1 TABLET EVERY DAY    atorvastatin  40 MG tablet Commonly known as: LIPITOR Take 40 mg by mouth daily.    Biotin 10 MG Tabs Take 1 tablet by mouth daily.    cholecalciferol 25 MCG (1000 UNIT) tablet Commonly known as: VITAMIN D3 Take 1,000 Units by mouth daily.    cimetidine 200 MG tablet Commonly known as: TAGAMET Take 200 mg by mouth 2 (two) times daily.    GAS-X PO Take 1-2 tablets by mouth 2 (two) times daily as needed (stomach irritation).    losartan  100 MG tablet Commonly known as: COZAAR  Take 100 mg by mouth daily.    ondansetron  4 MG disintegrating tablet Commonly known as: ZOFRAN -ODT Take 1 tablet (4 mg total) by mouth every 6 (six) hours as needed for nausea.    pantoprazole  40 MG tablet Commonly known as: PROTONIX  Take 40 mg by mouth daily.              Allergies:  Allergies  No Known Allergies     Family History:      Family History  Problem Relation Age of Onset   Colon cancer Mother          diagnosed around age 3   Colon cancer Brother          age 47   Diabetes Sister     Diabetes Brother     Cancer Niece 68        breast cancer    Liver disease Neg Hx     Lung cancer Neg Hx     Breast cancer Neg Hx     Ovarian cancer Neg Hx     Celiac disease Neg Hx            Social History:  reports that she has been smoking cigarettes. She has a 28 pack-year smoking history. She has never used smokeless tobacco. She reports that she does not drink alcohol and does not use drugs.   ROS: All other review of systems were reviewed and are negative except what is noted above in HPI   Physical Exam: BP (!) 157/77   Pulse 89   Constitutional:  Alert and oriented, No acute distress. HEENT: [place] AT, moist mucus membranes.  Trachea midline, no masses. Cardiovascular: No clubbing, cyanosis, or edema. Respiratory: Normal respiratory effort, no increased work of breathing. GI: Abdomen is soft, nontender, nondistended, no abdominal masses GU: No CVA tenderness.  Lymph: No cervical or inguinal lymphadenopathy. Skin: No rashes, bruises or suspicious lesions. Neurologic: Grossly intact, no focal deficits, moving all 4 extremities. Psychiatric: Normal mood and affect.   Laboratory Data: Recent Labs       Lab Results  Component Value Date    WBC 9.9 07/24/2023    HGB 15.3 (H) 07/24/2023    HCT 46.4 (H) 07/24/2023    MCV 88.5 07/24/2023    PLT 213 07/24/2023        Recent Labs       Lab Results  Component Value Date    CREATININE 0.84 07/24/2023        Recent Labs  No results found for: PSA     Recent Labs  No results found for: TESTOSTERONE     Recent Labs       Lab Results  Component Value Date    HGBA1C 6.0 (H) 03/14/2017        Urinalysis Labs (Brief)          Component Value  Date/Time     COLORURINE YELLOW 10/28/2016 1929    APPEARANCEUR Clear 03/26/2024 1111    LABSPEC 1.009 10/28/2016 1929    PHURINE 5.0 10/28/2016 1929    GLUCOSEU Negative 03/26/2024 1111    HGBUR SMALL (A) 10/28/2016 1929    BILIRUBINUR Negative 03/26/2024 1111    KETONESUR NEGATIVE 10/28/2016 1929    PROTEINUR Negative 03/26/2024 1111    PROTEINUR NEGATIVE 10/28/2016 1929    NITRITE Negative 03/26/2024 1111    NITRITE NEGATIVE 10/28/2016 1929    LEUKOCYTESUR Negative 03/26/2024 1111        Recent Labs       Lab Results  Component Value Date    LABMICR See below: 03/26/2024    WBCUA None seen 03/26/2024    LABEPIT 0-10 03/26/2024    BACTERIA None seen 03/26/2024        Pertinent Imaging: MRI 04/02/2024: Images reviewed and discussed with the patient  No results found for this or any previous visit.   No results found for this or any previous visit.   No results found for this or any previous visit.   No results found for this or any previous visit.   No results found for this or any previous visit.   No results found for this or any previous visit.   No results found for this or any previous visit.   Results for orders placed during the hospital encounter of 10/23/16   CT RENAL STONE STUDY   Narrative CLINICAL DATA:  78 year old female with right flank pain. Swelling right abdomen. Prior fundoplication and cholecystectomy. Initial encounter.   EXAM: CT ABDOMEN AND PELVIS WITHOUT CONTRAST   TECHNIQUE: Multidetector CT imaging of the abdomen and pelvis was performed following the standard protocol without IV contrast.   COMPARISON:  10/01/2015 CT.   FINDINGS: Lower chest: Tiny calcified granuloma right middle lobe. Prominent mitral valve calcifications. Aortic valve calcification. Coronary artery calcification. Heart size within normal limits. Dense breast parenchyma contain coarse calcification on the left.   Hepatobiliary: Taking into account limitation by non  contrast imaging, no worrisome hepatic lesion. Postcholecystectomy.   Pancreas: Taking into account limitation by non contrast imaging, no mass or inflammation.   Spleen: Taking into account limitation by non contrast imaging, no mass or enlargement.   Adrenals/Urinary Tract: 3.9 x 2.2 x 2.4 cm complex left adrenal mass contains tiny calcifications. There has been a change in the density of a component of this lesion. This will require further evaluation with contrast-enhanced dedicated adrenal MR as malignancy cannot be excluded.   No right adrenal lesion.   No obstructing renal or ureteral calculi or evidence hydronephrosis. 6 cm right kidney with tiny peripheral calcifications without significant change. 7 mm slightly exophytic lesion posterior inferior aspect right kidney. It is possible this represents a hyperdense cyst although a solid mass not excluded. This can be assessed with dedicated MR.   Decompressed urinary bladder.   Stomach/Bowel: Moderately large hiatal hernia. Postsurgical changes.   Dilated fluid-filled loops of proximal to mid small bowel of indeterminate etiology possibly related to enteritis without point of obstruction identified. Low grade small bowel obstruction secondary less likely consideration.   Prominent colonic diverticula most notable left colon without extraluminal bowel inflammatory process, free fluid or free air.   Vascular/Lymphatic: Atherosclerotic changes aorta without aneurysmal dilation. Atherosclerotic changes iliac arteries.   No adenopathy.   Reproductive: Endometrium appears prominent.  No adnexal mass noted.   Other: No bowel containing hernia.   Musculoskeletal:  Lumbar degenerative changes most notable L4-5.   IMPRESSION: Dilated fluid-filled loops of proximal to mid small bowel of indeterminate etiology possibly related to enteritis without point of obstruction identified. Low grade small bowel obstruction secondary  less likely consideration.   Prominent colonic diverticula most notable left colon without extraluminal bowel inflammatory process, free fluid or free air.   Moderately large hiatal hernia.  Postsurgical changes   No obstructing renal or ureteral calculi. 6 cm right renal cyst without change.   **An incidental finding of potential clinical significance has been found. 3.9 x 2.2 x 2.4 cm complex left adrenal mass contains tiny calcifications. There has been a change in the density of a component of this lesion. This will require further evaluation with contrast-enhanced dedicated adrenal MR as malignancy cannot be excluded.   **An incidental finding of potential clinical significance has been found. 7 mm slightly exophytic lesion posterior inferior aspect right kidney. It is possible this represents a hyperdense cyst although a solid mass not excluded. This can be assessed with dedicated MR.**   **An incidental finding of potential clinical significance has been found. Endometrial lining prominence as previously noted. Follow-up pelvic sonogram recommended.*     Electronically Signed By: Elspeth Slain M.D. On: 10/23/2016 06:46     Assessment & Plan:     1. Adrenal adenoma, unspecified laterality (Primary), Left The risks/benefits/alternatives to robotic left adrenalectomy was explained to the patient and she understands and wishes to proceed with surgery

## 2024-06-21 NOTE — Transfer of Care (Addendum)
 Immediate Anesthesia Transfer of Care Note  Patient: Courtney Dorsey  Procedure(s) Performed: ADRENALECTOMY, ROBOT-ASSISTED (Left: Abdomen)  Patient Location: PACU  Anesthesia Type:General  Level of Consciousness: drowsy and patient cooperative  Airway & Oxygen Therapy: Patient Spontanous Breathing and Patient connected to face mask oxygen  Post-op Assessment: Report given to RN and Post -op Vital signs reviewed and stable  Post vital signs: Reviewed and stable  Last Vitals:  Vitals Value Taken Time  BP 164/64 06/21/24 10:17  Temp 36.4 06/21/24   10:12  Pulse 88 06/21/24 10:20  Resp 16 06/21/24 10:20  SpO2 94 % 06/21/24 10:20  Vitals shown include unfiled device data.  Last Pain:  Vitals:   06/21/24 0646  TempSrc: Oral  PainSc: 0-No pain      Patients Stated Pain Goal: 7 (06/21/24 0646)  Complications: No notable events documented.

## 2024-06-22 LAB — CBC
HCT: 43.3 % (ref 36.0–46.0)
Hemoglobin: 13.9 g/dL (ref 12.0–15.0)
MCH: 28.9 pg (ref 26.0–34.0)
MCHC: 32.1 g/dL (ref 30.0–36.0)
MCV: 90 fL (ref 80.0–100.0)
Platelets: 198 K/uL (ref 150–400)
RBC: 4.81 MIL/uL (ref 3.87–5.11)
RDW: 14.6 % (ref 11.5–15.5)
WBC: 14.1 K/uL — ABNORMAL HIGH (ref 4.0–10.5)
nRBC: 0 % (ref 0.0–0.2)

## 2024-06-22 LAB — BASIC METABOLIC PANEL WITH GFR
Anion gap: 8 (ref 5–15)
BUN: 11 mg/dL (ref 8–23)
CO2: 26 mmol/L (ref 22–32)
Calcium: 8.3 mg/dL — ABNORMAL LOW (ref 8.9–10.3)
Chloride: 106 mmol/L (ref 98–111)
Creatinine, Ser: 0.78 mg/dL (ref 0.44–1.00)
GFR, Estimated: >60 mL/min (ref >60)
Glucose, Bld: 91 mg/dL (ref 70–99)
Potassium: 4 mmol/L (ref 3.5–5.1)
Sodium: 140 mmol/L (ref 135–145)

## 2024-06-22 LAB — SURGICAL PATHOLOGY

## 2024-06-22 MED ORDER — OXYCODONE-ACETAMINOPHEN 5-325 MG PO TABS: 1.0000 | ORAL_TABLET | ORAL | 0 refills | Status: AC | PRN

## 2024-06-22 NOTE — Evaluation (Signed)
 Physical Therapy Evaluation Patient Details Name: Courtney Dorsey MRN: [id number] DOB: [birth year] Today's Date: 06/22/2024  History of Present Illness  Ms Courtney Dorsey is a 78yo here for followup for a left adrenal mass. She has a hx of breast cancer treated 04/2019. Courtney Dorsey She underwent repeat MRI which showed increase in left adrenal tumor from 2.5cm to 4.1cm. Plasma metanephrins were normal. No weight gain or weight loss. No significant LUTS, s/p Left robot assisted laparoscopic radical adrenalectomy on 06/21/24   Clinical Impression  Patient functioning at baseline for functional mobility and gait demonstrating good return for ambulating in room, hallways without AD and pushing IV pole without loss of balance. Patent encouraged to ambulate ad lib for length of stay. Plan:  Patient discharged from physical therapy to care of nursing for ambulation daily as tolerated for length of stay.          If plan is discharge home, recommend the following: Help with stairs or ramp for entrance   Can travel by private vehicle        Equipment Recommendations None recommended by PT  Recommendations for Other Services       Functional Status Assessment Patient has not had a recent decline in their functional status     Precautions / Restrictions Precautions Precautions: None Recall of Precautions/Restrictions: Intact Restrictions Weight Bearing Restrictions Per Provider Order: No      Mobility  Bed Mobility Overal bed mobility: Independent                  Transfers Overall transfer level: Independent                      Ambulation/Gait Ambulation/Gait assistance: Independent, Modified independent (Device/Increase time) Gait Distance (Feet): 100 Feet Assistive device: IV Pole, None Gait Pattern/deviations: WFL(Within Functional Limits) Gait velocity: near normal     General Gait Details: grossly WFL with good return for ambulating in room, hallways without AD and while  pushing IV pole without loss of balance  Stairs            Wheelchair Mobility     Tilt Bed    Modified Rankin (Stroke Patients Only)       Balance Overall balance assessment: Independent                                           Pertinent Vitals/Pain Pain Assessment Pain Assessment: No/denies pain    Home Living Family/patient expects to be discharged to:: Private residence Living Arrangements: Spouse/significant other;Children;Other relatives;Non-relatives/Friends Available Help at Discharge: Family;Available 24 hours/day Type of Home: House Home Access: Stairs to enter Entrance Stairs-Rails: Right;Left;Can reach both Entrance Stairs-Number of Steps: 3 Alternate Level Stairs-Number of Steps: 12 Home Layout: Two level Home Equipment: Grab bars - tub/shower      Prior Function Prior Level of Function : Independent/Modified Independent;Driving             Mobility Comments: Community ambulation without AD, drives, shops ADLs Comments: Independent     Extremity/Trunk Assessment   Upper Extremity Assessment Upper Extremity Assessment: Overall WFL for tasks assessed    Lower Extremity Assessment Lower Extremity Assessment: Overall WFL for tasks assessed    Cervical / Trunk Assessment Cervical / Trunk Assessment: Normal  Communication   Communication Communication: No apparent difficulties    Cognition Arousal: Alert Behavior During Therapy: WFL for tasks  assessed/performed   PT - Cognitive impairments: No apparent impairments                         Following commands: Intact       Cueing Cueing Techniques: Verbal cues     General Comments      Exercises     Assessment/Plan    PT Assessment Patient does not need any further PT services  PT Problem List         PT Treatment Interventions      PT Goals (Current goals can be found in the Care Plan section)  Acute Rehab PT Goals Patient Stated Goal:  return home with family to assist PT Goal Formulation: With patient/family Time For Goal Achievement: 06/22/24 Potential to Achieve Goals: Good    Frequency       Co-evaluation               AM-PAC PT 6 Clicks Mobility  Outcome Measure Help needed turning from your back to your side while in a flat bed without using bedrails?: None Help needed moving from lying on your back to sitting on the side of a flat bed without using bedrails?: None Help needed moving to and from a bed to a chair (including a wheelchair)?: None Help needed standing up from a chair using your arms (e.g., wheelchair or bedside chair)?: None Help needed to walk in hospital room?: None Help needed climbing 3-5 steps with a railing? : A Little 6 Click Score: 23    End of Session   Activity Tolerance: Patient tolerated treatment well Patient left: in chair;with call bell/phone within reach;with family/visitor present Nurse Communication: Mobility status PT Visit Diagnosis: Unsteadiness on feet (R26.81);Other abnormalities of gait and mobility (R26.89);Muscle weakness (generalized) (M62.81)    Time: 8969-8957 PT Time Calculation (min) (ACUTE ONLY): 12 min   Charges:   PT Evaluation $PT Eval Low Complexity: 1 Low PT Treatments $Gait Training: 8-22 mins PT General Charges $$ ACUTE PT VISIT: 1 Visit         11:52 AM, 06/22/24 Lynwood Music, MPT Physical Therapist with [facility] [phone] office [phone] mobile phone

## 2024-06-22 NOTE — TOC CM/SW Note (Signed)
 Transition of Care [facility]) CM/SW Note    Transition of Care [facility]) - Inpatient Brief Assessment   Patient Details  Name: Courtney Dorsey MRN: [id number] Date of Birth: 01-07-1946  [facility] of [facility]) CM/SW Contact:    Noreen KATHEE Cleotilde ISRAEL Phone Number: 06/22/2024, 9:18 AM   Clinical Narrative:  Inpatient Care Management (ICM) has reviewed patient and no other ICM needs have been identified at this time. We will continue to monitor patient advancement through interdisciplinary progression rounds. If new patient transition needs arise, please place a ICM consult.  Transition of Care Asessment: Insurance and Status: Insurance coverage has been reviewed Patient has primary care physician: Yes Home environment has been reviewed: Single family Home Prior level of function:: Independent Prior/Current Home Services: No current home services Social Drivers of Health Review: SDOH reviewed no interventions necessary Readmission risk has been reviewed: Yes Transition of care needs: no transition of care needs at this time

## 2024-06-22 NOTE — Progress Notes (Signed)
 Patient discharged home. IVs removed and sites intact. Discharge instructions reviewed and all questions answered. Patient satisfied that all belongings were returned.

## 2024-06-22 NOTE — Plan of Care (Signed)
  Problem: Clinical Measurements: Goal: Ability to maintain clinical measurements within normal limits will improve Outcome: Progressing Goal: Will remain free from infection Outcome: Progressing Goal: Diagnostic test results will improve Outcome: Progressing Goal: Respiratory complications will improve Outcome: Progressing Goal: Cardiovascular complication will be avoided Outcome: Progressing   Problem: Activity: Goal: Risk for activity intolerance will decrease Outcome: Progressing   Problem: Coping: Goal: Level of anxiety will decrease Outcome: Progressing   Problem: Pain Managment: Goal: General experience of comfort will improve and/or be controlled Outcome: Progressing

## 2024-06-25 ENCOUNTER — Telehealth: Payer: Self-pay

## 2024-06-25 DIAGNOSIS — D3500 Benign neoplasm of unspecified adrenal gland: Secondary | ICD-10-CM

## 2024-06-25 MED ORDER — BACITRACIN 500 UNIT/GM EX OINT: 1.0000 | TOPICAL_OINTMENT | Freq: Two times a day (BID) | CUTANEOUS | 0 refills | Status: AC

## 2024-06-25 NOTE — Telephone Encounter (Signed)
 Called pt to let her know per verbal from MD McKenzie try miralax daily and bacitacin for stiches. Pt voiced her understanding

## 2024-06-25 NOTE — Telephone Encounter (Signed)
 Patient left a voice message.  Having no bowel movements since 06-20-2024. Wanting to know what can be done and if it is normal?  Asking what she will need to put on her surgery stitches?  Please advise.  Call:  [phone]

## 2024-06-29 NOTE — Discharge Summary (Signed)
 Physician Discharge Summary  Patient ID: Courtney Dorsey MRN: [id number] DOB/AGE: [birth year] 76 y.o.  Admit date: 06/21/2024 Discharge date: 06/22/2024  Admission Diagnoses:  Adrenal mass  Discharge Diagnoses:  Principal Problem:   Adrenal mass Active Problems:   Left adrenal mass   Past Medical History:  Diagnosis Date   Anemia    Diverticulosis    GERD (gastroesophageal reflux disease)    Heart murmur    childhood    History of hiatal hernia    History of radiation therapy 07/21/19- 09/02/19   Left Breast 25 fx of 2 Gy each to total 50 Gy. Left Breast boost 5 fx of 2 Gy each to total 10 Gy   History of skin cancer    History of transfusion    Hypercholesteremia    Hypertension    Personal history of radiation therapy    Skin cancer    UTI (lower urinary tract infection)    TAKING ANTIBIOTICS    Surgeries: Procedure(s): ADRENALECTOMY, ROBOT-ASSISTED on 06/21/2024   Consultants (if any):   Discharged Condition: Improved  Hospital Course: Courtney Dorsey is an 78 y.o. female who was admitted 06/21/2024 with a diagnosis of Adrenal mass and went to the operating room on 06/21/2024 and underwent the above named procedures.    She was given perioperative antibiotics:  Anti-infectives (From admission, onward)    Start     Dose/Rate Route Frequency Ordered Stop   06/21/24 1600  ceFAZolin  (ANCEF ) IVPB 2g/100 mL premix        2 g 200 mL/hr over 30 Minutes Intravenous Every 8 hours 06/21/24 1115 06/21/24 2225   06/21/24 0619  ceFAZolin  (ANCEF ) 2-4 GM/100ML-% IVPB       Note to Pharmacy: Billy Sluder H: cabinet override      06/21/24 0619 06/21/24 0837   06/21/24 0617  ceFAZolin  (ANCEF ) IVPB 2g/100 mL premix        2 g 200 mL/hr over 30 Minutes Intravenous 30 min pre-op 06/21/24 0617 06/21/24 0833     .  She was given sequential compression devices, early ambulation for DVT prophylaxis.  She benefited maximally from the hospital stay and there were no  complications.    Inpatient Morphine  Milligram Equivalents Per Day 10/20 - 10/21   Values displayed are in units of MME/Day    Order Start / End Date 10/20 10/21    oxyCODONE  (Oxy IR/ROXICODONE ) immediate release tablet 5 mg 10/20 - 10/20 0 of Unknown --    oxyCODONE  (ROXICODONE ) 5 MG/5ML solution 5 mg 10/20 - 10/20 0 of Unknown --      Group total: 0 of Unknown     fentaNYL  (SUBLIMAZE ) injection 25-50 mcg 10/20 - 10/20 0 of 45-90 --    fentaNYL  (SUBLIMAZE ) injection 10/20 - 10/20 *30 of 30 --    HYDROmorphone  (DILAUDID ) injection 10/20 - 10/20 *20 of 20 --    oxyCODONE  (Oxy IR/ROXICODONE ) immediate release tablet 5 mg 10/20 - 10/21 15 of 30 7.5 of 37.5    HYDROmorphone  (DILAUDID ) injection 0.5-1 mg 10/20 - 10/21 0 of 70-140 0 of 100-200    Daily Totals  * 65 of Unknown (at least 195-310) 7.5 of 137.5-237.5  *One-Step medication  Calculation Errors     Order Type Date Details   oxyCODONE  (Oxy IR/ROXICODONE ) immediate release tablet 5 mg Ordered Dose -- Insufficient frequency information   oxyCODONE  (ROXICODONE ) 5 MG/5ML solution 5 mg Ordered Dose -- Insufficient frequency information  Recent vital signs:  Vitals:   06/22/24 0900 06/22/24 1104  BP: 116/61   Pulse: 74   Resp: (!) 21   Temp:  99.4 F (37.4 C)  SpO2: 91%     Recent laboratory studies:  Lab Results  Component Value Date   HGB 13.9 06/22/2024   HGB 15.1 (H) 06/21/2024   HGB 15.2 (H) 06/17/2024   Lab Results  Component Value Date   WBC 14.1 (H) 06/22/2024   PLT 198 06/22/2024   Lab Results  Component Value Date   INR 0.93 12/31/2012   Lab Results  Component Value Date   NA 140 06/22/2024   K 4.0 06/22/2024   CL 106 06/22/2024   CO2 26 06/22/2024   BUN 11 06/22/2024   CREATININE 0.78 06/22/2024   GLUCOSE 91 06/22/2024    Discharge Medications:   Allergies as of 06/22/2024   No Known Allergies      Medication List     STOP taking these medications    cimetidine 200 MG  tablet Commonly known as: TAGAMET       TAKE these medications    acetaminophen  500 MG tablet Commonly known as: TYLENOL  Take 1,000 mg by mouth daily as needed for headache.   anastrozole  1 MG tablet Commonly known as: ARIMIDEX  TAKE 1 TABLET EVERY DAY   aspirin  EC 81 MG tablet Take 81 mg by mouth daily. Swallow whole.   atorvastatin  40 MG tablet Commonly known as: LIPITOR Take 40 mg by mouth daily.   Biotin 89999 MCG Tbdp Take 10,000 mcg by mouth daily.   CALCIUM  600 PO Take 600 mg by mouth daily.   GAS-X PO Take 1-2 tablets by mouth 2 (two) times daily as needed (stomach irritation).   losartan  100 MG tablet Commonly known as: COZAAR  Take 100 mg by mouth daily.   Magnesium  Citrate 100 MG Tabs Take 100 mg by mouth daily.   Melatonin 10 MG Tabs Take 10 mg by mouth at bedtime.   oxyCODONE -acetaminophen  5-325 MG tablet Commonly known as: Percocet Take 1 tablet by mouth every 4 (four) hours as needed for severe pain (pain score 7-10).   pantoprazole  40 MG tablet Commonly known as: PROTONIX  Take 40 mg by mouth daily.   Vitamin D3 125 MCG (5000 UT) Tabs Take 5,000 Units by mouth daily.        Diagnostic Studies: No results found.  Disposition: Discharge disposition: 01-Home or Self Care          Follow-up Information     Kymia Simi, Belvie CROME, MD. Call in 2 week(s).   Specialty: Urology Contact information: [address]  [place] [address] [phone]                  Signed: Belvie Clara 06/29/2024, 8:27 AM

## 2024-07-02 LAB — TYPE AND SCREEN
ABO/RH(D): O POS
Antibody Screen: POSITIVE
Donor AG Type: NEGATIVE
Donor AG Type: NEGATIVE
PT AG Type: NEGATIVE
Unit division: 0
Unit division: 0

## 2024-07-02 LAB — BPAM RBC
Blood Product Expiration Date: 202511052359
Blood Product Expiration Date: 202511052359
Unit Type and Rh: 5100
Unit Type and Rh: 5100

## 2024-07-05 ENCOUNTER — Encounter: Payer: Self-pay | Admitting: Radiology

## 2024-07-12 ENCOUNTER — Encounter: Payer: Self-pay | Admitting: Urology

## 2024-07-12 ENCOUNTER — Ambulatory Visit (INDEPENDENT_AMBULATORY_CARE_PROVIDER_SITE_OTHER): Admitting: Urology

## 2024-07-12 VITALS — BP 159/72 | HR 101

## 2024-07-12 DIAGNOSIS — D3502 Benign neoplasm of left adrenal gland: Secondary | ICD-10-CM

## 2024-07-12 DIAGNOSIS — D3500 Benign neoplasm of unspecified adrenal gland: Secondary | ICD-10-CM

## 2024-07-12 NOTE — Patient Instructions (Signed)
 Adrenal Adenoma  An adrenal adenoma is a benign tumor of the glands that are located on top of each kidney (adrenal glands). These glands produce hormones. A benign tumor means that the growth is not cancer. A person may have one or more tumors in one or both glands. In almost all cases, adrenal adenomas do not cause any symptoms. These are called nonfunctional adenomas. In rare cases, an adenoma may produce high levels of hormones called cortisol or aldosterone. These tumors are called functional adenomas. Adrenal adenomas become more common as people grow older, but are unlikely to become cancerous. However, nonfunctional adenomas may become functional. What are the causes? In most cases, the cause of this condition is not known. In very rare cases, the condition may be passed from parent to child (inherited). Smoking and tobacco use is associated with significant increases in adrenal adenomas. What are the signs or symptoms? Symptoms of this condition depend on the type of adrenal adenoma that you have. Nonfunctional adrenal adenomas usually do not cause any symptoms. Symptoms of functional adrenal adenomas depend on which hormone is produced in high levels. Tumors that secrete cortisol cause a condition called Cushing's syndrome. Signs and symptoms include: Increased fat in the upper body. Tiredness and loss of energy. Muscle weakness. High blood pressure. High blood sugar. Bruising and purple stretch marks in the skin, usually on the upper body. Facial hair, acne, and menstrual irregularities in women. Tumors that secrete aldosterone cause a condition called primary aldosteronism. Signs and symptoms include: High blood pressure that may be difficult to control. Tiredness and loss of energy. Headache. Weakness or numbness. Low potassium levels in your blood. How is this diagnosed? This condition may be diagnosed based on: Your symptoms. Your health care provider may suspect the  condition if you have signs and symptoms of a functional adenoma. A physical exam. Blood and urine tests to check for high levels of hormones. Imaging studies to confirm the diagnosis. These may include: CT scan. MRI. PET scan. Biopsy. For this test, a sample of the tumor is removed and examined in a lab. This is done in rare cases where other tests have not given a clear result. Adrenal adenomas are often found by chance when imaging studies of the abdomen are done for other reasons. How is this treated? Treatment for this condition depends on the type of the adrenal adenoma that you have. You may treated with: Observation. This is done if you have a nonfunctional adrenal adenoma. For observation, you may need: Regular imaging studies to make sure the tumor is not growing. Blood or urine tests to make sure the tumor is not becoming functional. Surgery. This is done if you have a functional adenoma. Surgery is the main treatment for this condition and usually cures it. Medicines. These are used if surgery is not possible. The medicines block the effects of the hormones. Follow these instructions at home: Take over-the-counter and prescription medicines only as told by your health care provider. Return to your normal activities as told by your health care provider. Ask your health care provider what activities are safe for you. Do not use any products that contain nicotine or tobacco. These products include cigarettes, chewing tobacco, and vaping devices, such as e-cigarettes. If you need help quitting, ask your health care provider. Keep all follow-up visits. This is important. This may include visits for regular tests and imaging studies. Contact a health care provider if: You develop any of the signs or symptoms of  Cushing's syndrome or primary aldosteronism. You need help to stop smoking or using other tobacco products. Summary An adrenal adenoma is a benign tumor of the adrenal  gland. Nonfunctional adenomas rarely cause symptoms and do not need to be treated. Functional adenomas produce hormones and may cause symptoms of Cushing's syndrome or primary aldosteronism, depending on the type of hormone they produce. Adrenal adenomas do not become cancerous. Nonfunctional adenomas may become functional. Surgery to remove the tumor is the usual treatment for functional adenomas. This information is not intended to replace advice given to you by your health care provider. Make sure you discuss any questions you have with your health care provider. Document Revised: 07/07/2023 Document Reviewed: 07/07/2023 Elsevier Patient Education  2024 [employer].

## 2024-07-12 NOTE — Progress Notes (Unsigned)
 07/12/2024 8:52 AM   Courtney Dorsey [birth year] [id number]  Referring provider: Marvine Rush, MD [address] [place],  [place] [address]  No chief complaint on file.   HPI: [name] is a 78yo here for followup after left adrenalectomy. Pathology benign. She Denies pain from incisions. No drainage from incisions.   PMH: Past Medical History:  Diagnosis Date   Anemia    Diverticulosis    GERD (gastroesophageal reflux disease)    Heart murmur    childhood    History of hiatal hernia    History of radiation therapy 07/21/19- 09/02/19   Left Breast 25 fx of 2 Gy each to total 50 Gy. Left Breast boost 5 fx of 2 Gy each to total 10 Gy   History of skin cancer    History of transfusion    Hypercholesteremia    Hypertension    Personal history of radiation therapy    Skin cancer    UTI (lower urinary tract infection)    TAKING ANTIBIOTICS    Surgical History: Past Surgical History:  Procedure Laterality Date   BREAST LUMPECTOMY Left    BREAST LUMPECTOMY WITH RADIOACTIVE SEED AND AXILLARY LYMPH NODE DISSECTION Left 06/17/2019   Procedure: LEFT BREAST LUMPECTOMY X2  WITH RADIOACTIVE SEED X2 AND LEFT SEED TARGETED AXILLARY LYMPH NODE EXCISION AND SENTINEL LYMPH NODE EXCISION;  Surgeon: Courtney Lenis, MD;  Location: [facility];  Service: General;  Laterality: Left;   CHOLECYSTECTOMY     COLONOSCOPY  2009   Dr. Oneil Dorsey   COLONOSCOPY N/A 01/01/2013   DOQ:Wnmfjo mucosa in the terminal ileum  Mild diverticulosis in the descending colon and sigmoid colon and sigmoid colon/Moderate sized internal hemorrhoids   ESOPHAGOGASTRODUODENOSCOPY N/A 01/01/2013   DOQ:Dfjoo hiatal hernia/ MODERATE SIZE PARA-ESOPHAGEAL HERNIA/MILD Erosive gastritis. chronic duodenitis c/w peptic duodenitis. no h.pylori or villous atrophy. minimal chronic gastritis.    ESOPHAGOGASTRODUODENOSCOPY N/A 02/16/2013   Procedure: ESOPHAGOGASTRODUODENOSCOPY (EGD);  Surgeon: Courtney CHRISTELLA Hollingshead, MD;   Location: [facility];  Service: Endoscopy;  Laterality: N/A;  8:30   GIVENS CAPSULE STUDY N/A 02/16/2013   EGD with capsule placement. no evidence of paraesophageal hernia. SB essentially unremarkable.   HIATAL HERNIA REPAIR N/A 03/21/2017   Procedure: LAPAROSCOPIC lysis of adhesions,reduction hiatal hernia, upper endoscopy,gastropexy;  Surgeon: Courtney Cough, MD;  Location: [facility];  Service: General;  Laterality: N/A;   KNOT REMOVED FROM SCALP     LAPAROSCOPIC NISSEN FUNDOPLICATION N/A 10/10/2015   Procedure: LAPAROSCOPIC NISSEN FUNDOPLICATION AND HIATAL HERNIA REPAIR;  Surgeon: Dorsey Gladis, MD;  Location: [facility];  Service: General;  Laterality: N/A;   ROBOTIC ADRENALECTOMY Left 06/21/2024   Procedure: ADRENALECTOMY, ROBOT-ASSISTED;  Surgeon: Courtney Belvie CROME, MD;  Location: [facility];  Service: Urology;  Laterality: Left;    Home Medications:  Allergies as of 07/12/2024   No Known Allergies      Medication List        Accurate as of July 12, 2024  8:52 AM. If you have any questions, ask your nurse or doctor.          acetaminophen  500 MG tablet Commonly known as: TYLENOL  Take 1,000 mg by mouth daily as needed for headache.   anastrozole  1 MG tablet Commonly known as: ARIMIDEX  TAKE 1 TABLET EVERY DAY   aspirin  EC 81 MG tablet Take 81 mg by mouth daily. Swallow whole.   atorvastatin  40 MG tablet Commonly known as: LIPITOR Take 40 mg by mouth daily.  bacitracin 500 UNIT/GM ointment Apply 1 Application topically 2 (two) times daily.   Biotin 89999 MCG Tbdp Take 10,000 mcg by mouth daily.   CALCIUM  600 PO Take 600 mg by mouth daily.   GAS-X PO Take 1-2 tablets by mouth 2 (two) times daily as needed (stomach irritation).   losartan  100 MG tablet Commonly known as: COZAAR  Take 100 mg by mouth daily.   Magnesium  Citrate 100 MG Tabs Take 100 mg by mouth daily.   Melatonin 10 MG Tabs Take 10 mg by mouth at bedtime.   oxyCODONE -acetaminophen  5-325 MG  tablet Commonly known as: Percocet Take 1 tablet by mouth every 4 (four) hours as needed for severe pain (pain score 7-10).   pantoprazole  40 MG tablet Commonly known as: PROTONIX  Take 40 mg by mouth daily.   Vitamin D3 125 MCG (5000 UT) Tabs Take 5,000 Units by mouth daily.        Allergies: No Known Allergies  Family History: Family History  Problem Relation Age of Onset   Colon cancer Mother        diagnosed around age 24   Colon cancer Brother        age 8   Diabetes Sister    Diabetes Brother    Cancer Niece 55       breast cancer    Liver disease Neg Hx    Lung cancer Neg Hx    Breast cancer Neg Hx    Ovarian cancer Neg Hx    Celiac disease Neg Hx     Social History:  reports that she has been smoking cigarettes. She has a 28 pack-year smoking history. She has never used smokeless tobacco. She reports that she does not drink alcohol and does not use drugs.  ROS: All other review of systems were reviewed and are negative except what is noted above in HPI  Physical Exam: BP (!) 159/72   Pulse (!) 101   Constitutional:  Alert and oriented, No acute distress. HEENT: Courtney Dorsey AT, moist mucus membranes.  Trachea midline, no masses. Cardiovascular: No clubbing, cyanosis, or edema. Respiratory: Normal respiratory effort, no increased work of breathing. GI: Abdomen is soft, nontender, nondistended, no abdominal masses GU: No CVA tenderness.  Lymph: No cervical or inguinal lymphadenopathy. Skin: No rashes, bruises or suspicious lesions. Neurologic: Grossly intact, no focal deficits, moving all 4 extremities. Psychiatric: Normal mood and affect.  Laboratory Data: Lab Results  Component Value Date   WBC 14.1 (H) 06/22/2024   HGB 13.9 06/22/2024   HCT 43.3 06/22/2024   MCV 90.0 06/22/2024   PLT 198 06/22/2024    Lab Results  Component Value Date   CREATININE 0.78 06/22/2024    No results found for: PSA  No results found for: TESTOSTERONE  Lab Results   Component Value Date   HGBA1C 6.0 (H) 03/14/2017    Urinalysis    Component Value Date/Time   COLORURINE YELLOW 10/28/2016 1929   APPEARANCEUR Clear 05/24/2024 0858   LABSPEC 1.009 10/28/2016 1929   PHURINE 5.0 10/28/2016 1929   GLUCOSEU Negative 05/24/2024 0858   HGBUR SMALL (A) 10/28/2016 1929   BILIRUBINUR Negative 05/24/2024 0858   KETONESUR NEGATIVE 10/28/2016 1929   PROTEINUR 1+ (A) 05/24/2024 0858   PROTEINUR NEGATIVE 10/28/2016 1929   NITRITE Negative 05/24/2024 0858   NITRITE NEGATIVE 10/28/2016 1929   LEUKOCYTESUR Negative 05/24/2024 0858    Lab Results  Component Value Date   LABMICR See below: 05/24/2024   WBCUA 0-5 05/24/2024  LABEPIT 0-10 05/24/2024   BACTERIA None seen 05/24/2024    Pertinent Imaging: *** No results found for this or any previous visit.  No results found for this or any previous visit.  No results found for this or any previous visit.  No results found for this or any previous visit.  No results found for this or any previous visit.  No results found for this or any previous visit.  No results found for this or any previous visit.  Results for orders placed during the hospital encounter of 10/23/16  CT RENAL STONE STUDY  Narrative CLINICAL DATA:  78 year old female with right flank pain. Swelling right abdomen. Prior fundoplication and cholecystectomy. Initial encounter.  EXAM: CT ABDOMEN AND PELVIS WITHOUT CONTRAST  TECHNIQUE: Multidetector CT imaging of the abdomen and pelvis was performed following the standard protocol without IV contrast.  COMPARISON:  10/01/2015 CT.  FINDINGS: Lower chest: Tiny calcified granuloma right middle lobe. Prominent mitral valve calcifications. Aortic valve calcification. Coronary artery calcification. Heart size within normal limits. Dense breast parenchyma contain coarse calcification on the left.  Hepatobiliary: Taking into account limitation by non contrast imaging, no  worrisome hepatic lesion. Postcholecystectomy.  Pancreas: Taking into account limitation by non contrast imaging, no mass or inflammation.  Spleen: Taking into account limitation by non contrast imaging, no mass or enlargement.  Adrenals/Urinary Tract: 3.9 x 2.2 x 2.4 cm complex left adrenal mass contains tiny calcifications. There has been a change in the density of a component of this lesion. This will require further evaluation with contrast-enhanced dedicated adrenal MR as malignancy cannot be excluded.  No right adrenal lesion.  No obstructing renal or ureteral calculi or evidence hydronephrosis. 6 cm right kidney with tiny peripheral calcifications without significant change. 7 mm slightly exophytic lesion posterior inferior aspect right kidney. It is possible this represents a hyperdense cyst although a solid mass not excluded. This can be assessed with dedicated MR.  Decompressed urinary bladder.  Stomach/Bowel: Moderately large hiatal hernia. Postsurgical changes.  Dilated fluid-filled loops of proximal to mid small bowel of indeterminate etiology possibly related to enteritis without point of obstruction identified. Low grade small bowel obstruction secondary less likely consideration.  Prominent colonic diverticula most notable left colon without extraluminal bowel inflammatory process, free fluid or free air.  Vascular/Lymphatic: Atherosclerotic changes aorta without aneurysmal dilation. Atherosclerotic changes iliac arteries.  No adenopathy.  Reproductive: Endometrium appears prominent.  No adnexal mass noted.  Other: No bowel containing hernia.  Musculoskeletal: Lumbar degenerative changes most notable L4-5.  IMPRESSION: Dilated fluid-filled loops of proximal to mid small bowel of indeterminate etiology possibly related to enteritis without point of obstruction identified. Low grade small bowel obstruction secondary less likely  consideration.  Prominent colonic diverticula most notable left colon without extraluminal bowel inflammatory process, free fluid or free air.  Moderately large hiatal hernia.  Postsurgical changes  No obstructing renal or ureteral calculi. 6 cm right renal cyst without change.  **An incidental finding of potential clinical significance has been found. 3.9 x 2.2 x 2.4 cm complex left adrenal mass contains tiny calcifications. There has been a change in the density of a component of this lesion. This will require further evaluation with contrast-enhanced dedicated adrenal MR as malignancy cannot be excluded.  **An incidental finding of potential clinical significance has been found. 7 mm slightly exophytic lesion posterior inferior aspect right kidney. It is possible this represents a hyperdense cyst although a solid mass not excluded. This can be assessed with dedicated MR.**  **  An incidental finding of potential clinical significance has been found. Endometrial lining prominence as previously noted. Follow-up pelvic sonogram recommended.****   Electronically Signed By: Elspeth Slain M.D. On: 10/23/2016 06:46   Assessment & Plan:    1. Adrenal adenoma, unspecified laterality (Primary) *** - Urinalysis, Routine w reflex microscopic   No follow-ups on file.  Belvie Clara, MD  [facility] [place]

## 2024-07-12 NOTE — Progress Notes (Unsigned)
 Patient here today for staples removal. 20 staples visualized, in total.  20 staples removed today per MD order. Steri strips applied . No complications were noted. incision site  healing well Patient tolerated staple removal with no issues. Patient will keep followup

## 2024-08-05 ENCOUNTER — Telehealth: Payer: Self-pay

## 2024-08-10 NOTE — Telephone Encounter (Signed)
 Patient called and made aware.

## 2024-11-10 ENCOUNTER — Ambulatory Visit: Admitting: Urology

## 2025-03-09 ENCOUNTER — Other Ambulatory Visit (HOSPITAL_COMMUNITY)
